# Patient Record
Sex: Female | Born: 1970
Health system: Southern US, Community
[De-identification: ages and names within clinical notes are randomized; demographics above are authoritative.]

## PROBLEM LIST (undated history)

## (undated) DIAGNOSIS — F419 Anxiety disorder, unspecified: Secondary | ICD-10-CM

## (undated) DIAGNOSIS — J45909 Unspecified asthma, uncomplicated: Secondary | ICD-10-CM

## (undated) DIAGNOSIS — K219 Gastro-esophageal reflux disease without esophagitis: Secondary | ICD-10-CM

## (undated) DIAGNOSIS — F32A Depression, unspecified: Secondary | ICD-10-CM

## (undated) DIAGNOSIS — F329 Major depressive disorder, single episode, unspecified: Secondary | ICD-10-CM

## (undated) DIAGNOSIS — N200 Calculus of kidney: Secondary | ICD-10-CM

## (undated) DIAGNOSIS — T7840XA Allergy, unspecified, initial encounter: Secondary | ICD-10-CM

## (undated) DIAGNOSIS — I10 Essential (primary) hypertension: Secondary | ICD-10-CM

## (undated) HISTORY — DX: Calculus of kidney: N20.0

## (undated) HISTORY — DX: Allergy, unspecified, initial encounter: T78.40XA

## (undated) HISTORY — DX: Gastro-esophageal reflux disease without esophagitis: K21.9

## (undated) HISTORY — PX: BUNIONECTOMY: SHX129

## (undated) HISTORY — PX: OVARIAN CYST REMOVAL: SHX89

---

## 1998-08-08 HISTORY — PX: ABDOMINAL HYSTERECTOMY: SHX81

## 1998-08-20 ENCOUNTER — Other Ambulatory Visit: Admission: RE | Admit: 1998-08-20 | Discharge: 1998-08-20 | Payer: Self-pay | Admitting: Obstetrics and Gynecology

## 1998-09-07 ENCOUNTER — Encounter: Payer: Self-pay | Admitting: Obstetrics and Gynecology

## 1998-09-07 ENCOUNTER — Ambulatory Visit (HOSPITAL_COMMUNITY): Admission: RE | Admit: 1998-09-07 | Discharge: 1998-09-07 | Payer: Self-pay | Admitting: Obstetrics and Gynecology

## 1998-10-12 ENCOUNTER — Inpatient Hospital Stay (HOSPITAL_COMMUNITY): Admission: AD | Admit: 1998-10-12 | Discharge: 1998-10-12 | Payer: Self-pay | Admitting: Obstetrics and Gynecology

## 1998-10-16 ENCOUNTER — Inpatient Hospital Stay (HOSPITAL_COMMUNITY): Admission: AD | Admit: 1998-10-16 | Discharge: 1998-10-16 | Payer: Self-pay | Admitting: Obstetrics and Gynecology

## 1998-12-27 ENCOUNTER — Inpatient Hospital Stay (HOSPITAL_COMMUNITY): Admission: AD | Admit: 1998-12-27 | Discharge: 1998-12-27 | Payer: Self-pay | Admitting: Obstetrics and Gynecology

## 1998-12-29 ENCOUNTER — Inpatient Hospital Stay (HOSPITAL_COMMUNITY): Admission: AD | Admit: 1998-12-29 | Discharge: 1998-12-29 | Payer: Self-pay | Admitting: Obstetrics and Gynecology

## 1998-12-31 ENCOUNTER — Encounter (HOSPITAL_COMMUNITY): Admission: RE | Admit: 1998-12-31 | Discharge: 1999-01-21 | Payer: Self-pay | Admitting: Obstetrics and Gynecology

## 1999-01-02 ENCOUNTER — Inpatient Hospital Stay (HOSPITAL_COMMUNITY): Admission: AD | Admit: 1999-01-02 | Discharge: 1999-01-02 | Payer: Self-pay | Admitting: Obstetrics and Gynecology

## 1999-01-11 ENCOUNTER — Inpatient Hospital Stay (HOSPITAL_COMMUNITY): Admission: AD | Admit: 1999-01-11 | Discharge: 1999-01-11 | Payer: Self-pay | Admitting: Obstetrics and Gynecology

## 1999-01-18 ENCOUNTER — Inpatient Hospital Stay (HOSPITAL_COMMUNITY): Admission: AD | Admit: 1999-01-18 | Discharge: 1999-01-18 | Payer: Self-pay | Admitting: Obstetrics and Gynecology

## 1999-01-20 ENCOUNTER — Inpatient Hospital Stay (HOSPITAL_COMMUNITY): Admission: AD | Admit: 1999-01-20 | Discharge: 1999-01-22 | Payer: Self-pay | Admitting: Obstetrics and Gynecology

## 1999-03-17 ENCOUNTER — Other Ambulatory Visit: Admission: RE | Admit: 1999-03-17 | Discharge: 1999-03-17 | Payer: Self-pay | Admitting: Obstetrics and Gynecology

## 1999-03-17 ENCOUNTER — Encounter (INDEPENDENT_AMBULATORY_CARE_PROVIDER_SITE_OTHER): Payer: Self-pay | Admitting: Specialist

## 1999-04-01 ENCOUNTER — Inpatient Hospital Stay (HOSPITAL_COMMUNITY): Admission: RE | Admit: 1999-04-01 | Discharge: 1999-04-04 | Payer: Self-pay | Admitting: Obstetrics and Gynecology

## 1999-04-01 ENCOUNTER — Encounter (INDEPENDENT_AMBULATORY_CARE_PROVIDER_SITE_OTHER): Payer: Self-pay | Admitting: Specialist

## 1999-11-30 ENCOUNTER — Encounter: Payer: Self-pay | Admitting: Obstetrics and Gynecology

## 1999-11-30 ENCOUNTER — Ambulatory Visit (HOSPITAL_COMMUNITY): Admission: RE | Admit: 1999-11-30 | Discharge: 1999-11-30 | Payer: Self-pay | Admitting: Obstetrics and Gynecology

## 2000-01-20 ENCOUNTER — Ambulatory Visit (HOSPITAL_COMMUNITY): Admission: RE | Admit: 2000-01-20 | Discharge: 2000-01-20 | Payer: Self-pay | Admitting: Obstetrics and Gynecology

## 2000-01-20 ENCOUNTER — Encounter: Payer: Self-pay | Admitting: Obstetrics and Gynecology

## 2000-05-19 ENCOUNTER — Encounter: Payer: Self-pay | Admitting: *Deleted

## 2000-05-19 ENCOUNTER — Encounter: Admission: RE | Admit: 2000-05-19 | Discharge: 2000-05-19 | Payer: Self-pay | Admitting: *Deleted

## 2000-07-13 ENCOUNTER — Encounter: Admission: RE | Admit: 2000-07-13 | Discharge: 2000-07-13 | Payer: Self-pay | Admitting: *Deleted

## 2000-07-13 ENCOUNTER — Encounter: Payer: Self-pay | Admitting: *Deleted

## 2000-07-20 ENCOUNTER — Ambulatory Visit (HOSPITAL_COMMUNITY): Admission: RE | Admit: 2000-07-20 | Discharge: 2000-07-20 | Payer: Self-pay | Admitting: Obstetrics and Gynecology

## 2000-10-06 ENCOUNTER — Other Ambulatory Visit: Admission: RE | Admit: 2000-10-06 | Discharge: 2000-10-06 | Payer: Self-pay | Admitting: Obstetrics and Gynecology

## 2000-12-21 ENCOUNTER — Ambulatory Visit (HOSPITAL_BASED_OUTPATIENT_CLINIC_OR_DEPARTMENT_OTHER): Admission: RE | Admit: 2000-12-21 | Discharge: 2000-12-21 | Payer: Self-pay | Admitting: Orthopedic Surgery

## 2002-01-11 ENCOUNTER — Emergency Department (HOSPITAL_COMMUNITY): Admission: EM | Admit: 2002-01-11 | Discharge: 2002-01-11 | Payer: Self-pay | Admitting: Emergency Medicine

## 2002-06-17 ENCOUNTER — Other Ambulatory Visit: Admission: RE | Admit: 2002-06-17 | Discharge: 2002-06-17 | Payer: Self-pay | Admitting: Obstetrics and Gynecology

## 2002-06-19 ENCOUNTER — Encounter: Payer: Self-pay | Admitting: Obstetrics and Gynecology

## 2002-06-19 ENCOUNTER — Ambulatory Visit (HOSPITAL_COMMUNITY): Admission: RE | Admit: 2002-06-19 | Discharge: 2002-06-19 | Payer: Self-pay | Admitting: Obstetrics and Gynecology

## 2002-07-02 ENCOUNTER — Encounter: Payer: Self-pay | Admitting: Internal Medicine

## 2002-07-12 ENCOUNTER — Encounter: Payer: Self-pay | Admitting: Internal Medicine

## 2002-07-15 ENCOUNTER — Encounter: Payer: Self-pay | Admitting: Internal Medicine

## 2003-02-24 ENCOUNTER — Emergency Department (HOSPITAL_COMMUNITY): Admission: EM | Admit: 2003-02-24 | Discharge: 2003-02-24 | Payer: Self-pay

## 2004-01-14 ENCOUNTER — Other Ambulatory Visit: Admission: RE | Admit: 2004-01-14 | Discharge: 2004-01-14 | Payer: Self-pay | Admitting: Obstetrics and Gynecology

## 2004-01-19 ENCOUNTER — Inpatient Hospital Stay (HOSPITAL_COMMUNITY): Admission: EM | Admit: 2004-01-19 | Discharge: 2004-01-21 | Payer: Self-pay | Admitting: Emergency Medicine

## 2004-01-20 ENCOUNTER — Encounter (INDEPENDENT_AMBULATORY_CARE_PROVIDER_SITE_OTHER): Payer: Self-pay | Admitting: Cardiology

## 2005-08-23 ENCOUNTER — Other Ambulatory Visit: Admission: RE | Admit: 2005-08-23 | Discharge: 2005-08-23 | Payer: Self-pay | Admitting: Obstetrics and Gynecology

## 2005-12-16 ENCOUNTER — Ambulatory Visit (HOSPITAL_COMMUNITY): Admission: RE | Admit: 2005-12-16 | Discharge: 2005-12-16 | Payer: Self-pay | Admitting: Obstetrics and Gynecology

## 2006-03-13 ENCOUNTER — Emergency Department (HOSPITAL_COMMUNITY): Admission: EM | Admit: 2006-03-13 | Discharge: 2006-03-13 | Payer: Self-pay | Admitting: Emergency Medicine

## 2007-06-11 ENCOUNTER — Emergency Department (HOSPITAL_COMMUNITY): Admission: EM | Admit: 2007-06-11 | Discharge: 2007-06-11 | Payer: Self-pay | Admitting: Emergency Medicine

## 2008-04-24 ENCOUNTER — Observation Stay (HOSPITAL_COMMUNITY): Admission: AD | Admit: 2008-04-24 | Discharge: 2008-04-25 | Payer: Self-pay | Admitting: Family Medicine

## 2008-04-24 ENCOUNTER — Ambulatory Visit: Payer: Self-pay | Admitting: Family Medicine

## 2008-05-28 ENCOUNTER — Ambulatory Visit: Payer: Self-pay | Admitting: Internal Medicine

## 2008-05-28 ENCOUNTER — Inpatient Hospital Stay (HOSPITAL_COMMUNITY): Admission: EM | Admit: 2008-05-28 | Discharge: 2008-06-02 | Payer: Self-pay | Admitting: Emergency Medicine

## 2008-05-28 ENCOUNTER — Ambulatory Visit: Payer: Self-pay | Admitting: Family Medicine

## 2008-06-20 ENCOUNTER — Encounter: Payer: Self-pay | Admitting: Family Medicine

## 2008-06-20 ENCOUNTER — Ambulatory Visit (HOSPITAL_COMMUNITY): Admission: RE | Admit: 2008-06-20 | Discharge: 2008-06-20 | Payer: Self-pay | Admitting: Family Medicine

## 2008-06-20 ENCOUNTER — Ambulatory Visit: Payer: Self-pay | Admitting: Vascular Surgery

## 2009-03-24 ENCOUNTER — Encounter: Admission: RE | Admit: 2009-03-24 | Discharge: 2009-03-24 | Payer: Self-pay | Admitting: Family Medicine

## 2009-03-25 ENCOUNTER — Emergency Department (HOSPITAL_COMMUNITY): Admission: EM | Admit: 2009-03-25 | Discharge: 2009-03-25 | Payer: Self-pay | Admitting: Emergency Medicine

## 2009-03-30 ENCOUNTER — Emergency Department (HOSPITAL_COMMUNITY): Admission: EM | Admit: 2009-03-30 | Discharge: 2009-03-30 | Payer: Self-pay | Admitting: Emergency Medicine

## 2009-06-28 ENCOUNTER — Inpatient Hospital Stay (HOSPITAL_COMMUNITY): Admission: EM | Admit: 2009-06-28 | Discharge: 2009-06-30 | Payer: Self-pay | Admitting: Emergency Medicine

## 2009-07-14 ENCOUNTER — Ambulatory Visit: Payer: Self-pay | Admitting: Internal Medicine

## 2009-07-15 ENCOUNTER — Telehealth (INDEPENDENT_AMBULATORY_CARE_PROVIDER_SITE_OTHER): Payer: Self-pay | Admitting: *Deleted

## 2009-08-11 ENCOUNTER — Ambulatory Visit: Payer: Self-pay | Admitting: Internal Medicine

## 2009-08-11 ENCOUNTER — Encounter: Admission: RE | Admit: 2009-08-11 | Discharge: 2009-08-11 | Payer: Self-pay | Admitting: Internal Medicine

## 2009-08-11 ENCOUNTER — Encounter (INDEPENDENT_AMBULATORY_CARE_PROVIDER_SITE_OTHER): Payer: Self-pay | Admitting: *Deleted

## 2009-09-09 ENCOUNTER — Ambulatory Visit: Payer: Self-pay | Admitting: Internal Medicine

## 2009-09-09 DIAGNOSIS — K589 Irritable bowel syndrome without diarrhea: Secondary | ICD-10-CM | POA: Insufficient documentation

## 2009-09-09 DIAGNOSIS — R1031 Right lower quadrant pain: Secondary | ICD-10-CM | POA: Insufficient documentation

## 2009-09-09 DIAGNOSIS — R10819 Abdominal tenderness, unspecified site: Secondary | ICD-10-CM

## 2009-09-09 LAB — CONVERTED CEMR LAB: Tissue Transglutaminase Ab, IgA: 0.5 units (ref <7)

## 2009-09-22 ENCOUNTER — Telehealth: Payer: Self-pay | Admitting: Internal Medicine

## 2009-10-29 IMAGING — CR DG CHEST 2V
2 series · 2 of 2 positions shown · non-contrast
Comparison: Chest 2 view, 01/19/04.

CLINICAL DATA: Cough X 1 month. 
 CHEST - 2 VIEW:

[view not recorded (1 of 2)]
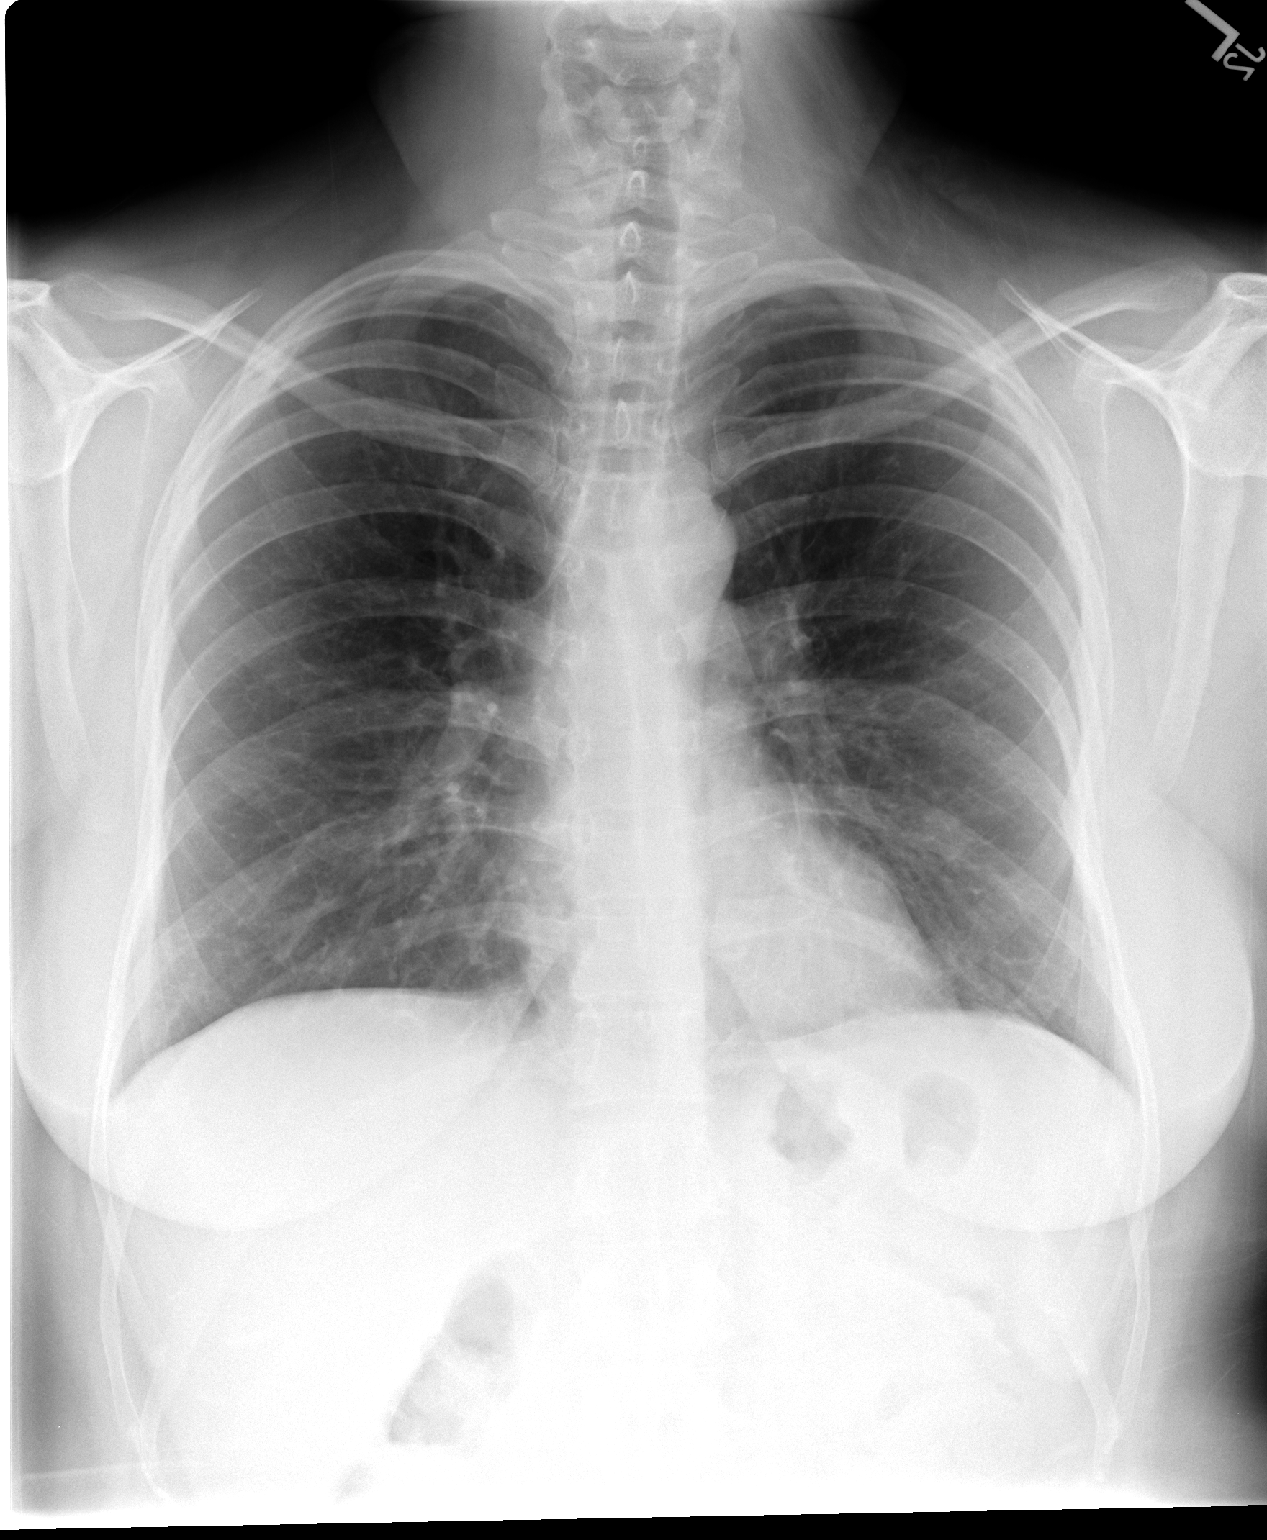

[view not recorded (2 of 2)]
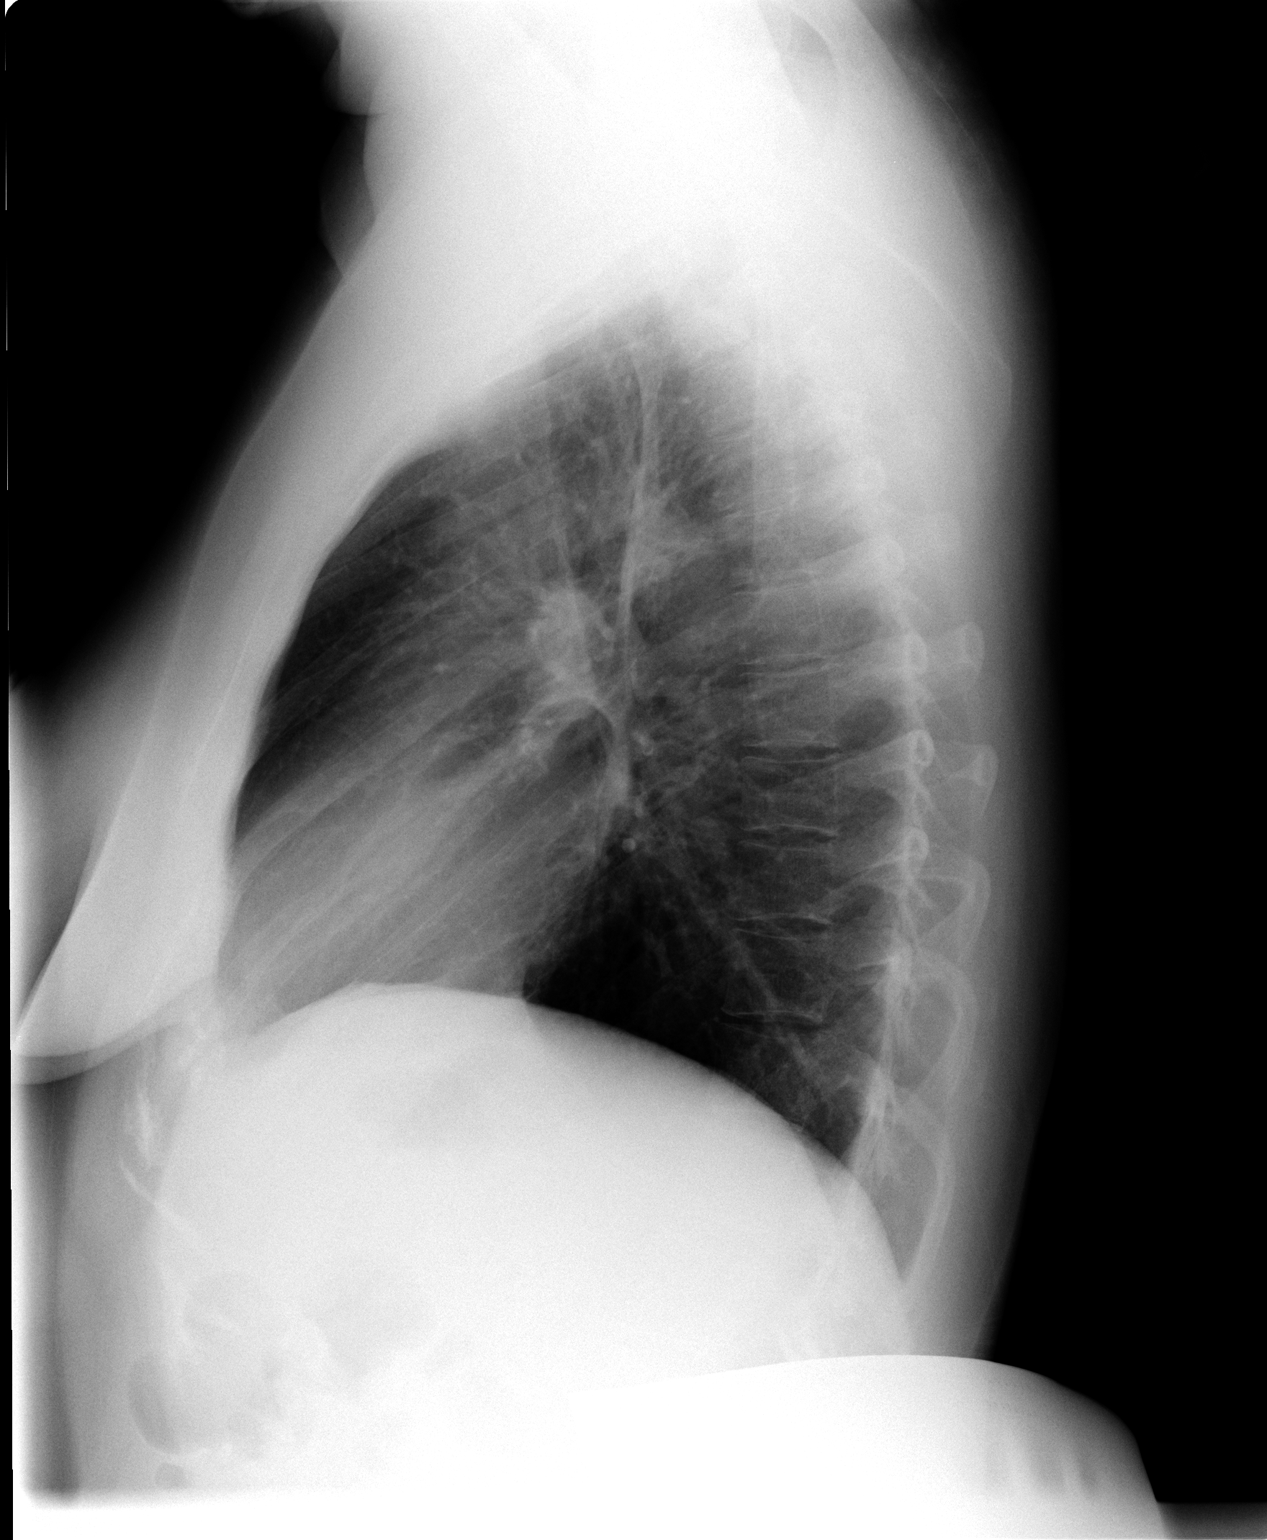

[2 of 2 positions shown; findings below may reference images not displayed]

FINDINGS: Normal heart.  Costophrenic angles are clear.  Normal pulmonary vasculature.  No evidence of effusion, infiltrate, or pneumothorax.
IMPRESSION: No acute cardiopulmonary process.

## 2010-09-07 NOTE — Progress Notes (Signed)
Summary: pain clinic eval  Phone Note Other Incoming   Summary of Call: Spoke to Dr. Jackelyn Knife re: her problems he does not think laparoscopy necessarily indicated at this time will pursue tertiary GI or pain clinic eval Initial call taken by: Iva Boop MD, Clementeen Graham,  September 22, 2009 10:38 PM  Follow-up for Phone Call        please call her and tell her I recommend a pain clinic eval and that I have discussed with dr. Jackelyn Knife. We do not thinkfurther GI or GYN w/u needed right now and that pain seems to be in abdominal wall. would refer to [facility] (# we got from [place]) let me know Follow-up by: Iva Boop MD, Clementeen Graham,  September 29, 2009 7:52 AM  Additional Follow-up for Phone Call Additional follow up Details #1::        I spoke with patient and she states pain has resolved and at this time she doesn't want to persue.  I have asked ehr to call back and we can make referral if the pain returns.  I will keep the referral packet and her records at my desk incase she calls back. Additional Follow-up by: Darcey Nora RN, CGRN,  September 30, 2009 10:52 AM    Additional Follow-up for Phone Call Additional follow up Details #2::    ok great.  Follow-up by: Iva Boop MD, Clementeen Graham,  September 30, 2009 3:03 PM

## 2010-09-07 NOTE — Procedures (Signed)
Summary: EGD: GERD, gastritis, duodenitis   EGD  Procedure date:  07/12/2002  Findings:      Location: [facility]   Patient Name: Laura Clay, Laura Clay. MRN:  Procedure Procedures: Panendoscopy (EGD) CPT: 43235.    with biopsy(s)/brushing(s). CPT: [id number].  Personnel: Endoscopist: Iva Boop, MD, [facility].  Exam Location: Exam performed in Outpatient Clinic. Outpatient  Patient Consent: Procedure, Alternatives, Risks and Benefits discussed, consent obtained, from patient. Consent was obtained by the RN.  Indications Symptoms: Chest Pain. Abdominal pain, Reflux symptoms  History  Pre-Exam Physical: Performed Jul 12, 2002  Cardio-pulmonary exam, HEENT exam, Mental status exam WNL.  Exam Exam Info: Maximum depth of insertion Duodenum, intended Duodenum. Patient position: on left side. Vocal cords not visualized. Gastric retroflexion performed. Images taken. ASA Classification: I. Tolerance: good.  Sedation Meds: Patient assessed and found to be appropriate for moderate (conscious) sedation. Fentanyl 100 mcg. given IV. Versed 5 mg. given IV. Cetacaine Spray 2 sprays given aerosolized.  Monitoring: BP and pulse monitoring done. Oximetry used. Supplemental O2 given  Findings ESOPHAGEAL INFLAMMATION: suspected as a result of reflux. Severity is moderate, erosions present.  Biopsy/Esoph Inflamtn taken. ICD9: Esophagitis, Reflux: 530.11. Comments: ? single erosion at GE junction. .  MUCOSAL ABNORMALITY: Duodenal Bulb. Erythematous mucosa. Red spots present. Edema present. Biopsy/Mucosal Abn taken. ICD9: Duodenitis without Hemorrhage: 535.60.  MUCOSAL ABNORMALITY: Antrum. Mottled mucosa. taken. ICD9: Gastritis, Unspecified: 535.50.  Normal: Duodenal Apex.    Comments: All areas not described above are normal. Assessment Abnormal examination, see findings above.  Diagnoses: 530.11: Esophagitis, Reflux.  535.50: Gastritis, Unspecified.  535.60: Duodenitis without  Hemorrhage.   Events  Unplanned Intervention: No unplanned interventions were required.  Plans Medication(s): Await pathology. PPI: Aciphex 20 mg QD, starting Jul 12, 2002   Disposition: After procedure patient sent to recovery. After recovery patient sent home.  Comments: Will set up follow-up after colonoscopy.   CC:   Lavina Hamman, MD  This report was created from the original endoscopy report, which was reviewed and signed by the above listed endoscopist.

## 2010-09-07 NOTE — Procedures (Signed)
Summary: Colonoscopy+ terminal ileoscopy: Hemorrhoids, neg random bxs   Colonoscopy  Procedure date:  07/15/2002  Findings:      Results: Hemorrhoids.     Location:  [facility].   Patient Name: Laura Clay, Laura Clay. MRN:  Procedure Procedures: Colonoscopy CPT: [phone].    with biopsy. CPT: Q5068410.  Personnel: Endoscopist: Iva Boop, MD, [facility].  Exam Location: Exam performed in Outpatient Clinic. Outpatient  Patient Consent: Procedure, Alternatives, Risks and Benefits discussed, consent obtained, from patient. Consent was obtained by the RN.  Indications Symptoms: Diarrhea Hematochezia.  History  Pre-Exam Physical: Performed Jul 12, 2002. Cardio-pulmonary exam, Rectal exam, HEENT exam , Mental status exam WNL.  Exam Exam: Extent of exam reached: Terminal Ileum, extent intended: Terminal Ileum.  The cecum was identified by appendiceal orifice and IC valve. Patient position: on left side. Colon retroflexion performed. Images taken. ASA Classification: I. Tolerance: good.  Monitoring: Pulse and BP monitoring, Oximetry used. Supplemental O2 given.  Colon Prep Used Golytely for colon prep. Prep results: excellent.  Sedation Meds: Patient assessed and found to be appropriate for moderate (conscious) sedation. Fentanyl 100 mcg. given IV. Versed 10 mg. given IV.  Findings - NORMAL EXAM: Ileum to Sigmoid Colon. Biopsy/Normal Exam taken. Comments: Random biopsies to exclude microscopic colitis.  HEMORRHOIDS: Internal and External. Size: Grade II. Not bleeding. Not thrombosed. ICD9: Hemorrhoids, Internal and  External: 455.6.   Assessment Abnormal examination, see findings above.  Diagnoses: 455.6: Hemorrhoids, Internal and  External.   Events  Unplanned Interventions: No intervention was required.  Plans Medication Plan: Await pathology.  Patient Education: Patient given standard instructions for: Hemorrhoids.  Disposition: After procedure patient sent  to recovery. After recovery patient sent home.  Scheduling/Referral: Clinic Visit, to Iva Boop, MD, [facility], 2-3 weeks,   CC:   Lavina Hamman, MD  This report was created from the original endoscopy report, which was reviewed and signed by the above listed endoscopist.

## 2010-09-07 NOTE — Assessment & Plan Note (Signed)
Summary: rlq abd pain, rule out ibs...em   History of Present Illness Visit Type: consult  Primary GI MD: Stan Head MD [facility] Primary Provider: Marlan Palau, MD Requesting Provider: Marlan Palau, MD Chief Complaint: right side pain, diarrhea, and constipation  History of Present Illness:   40 yo white woman with  a several month history of abdominal pain that began and is mainly located in the RLQ. She actually has had simiilar problems before and was evaluated by me with EGD and colonoscopy in 2003. She did not follow-up. The problems resolved but returned in 2010. She has had severe RLQ pain in 03/2009, fairly sudden onset, crampy. CT then at ED evaluation was negative. More ED visit(s) with another CT and tne a third negative abd/pelvic CT recently negative. Has seen Dr. Jackelyn Knife and has had pelvic exam. She says he told her if it isn't a GI problem then a laparoscopy is in order. She had a laparoscopy and LOA (minimal adhesiolysis)in 2007 (reviewed)  Her pain is now mainly a dull sensation but she has an exquisite sensitivity to abdominal wall palpation which produces a gag, nausea and sometinmes vomiting. She cannot even hug her husband without inducing a gag reflex. There is some bacjkground nausea helped by promethazine. Hyoscyamine has not provided relief. She has has temps 99-100.  Bowels aklternate between hard an loose. Some heartburn. No trigers noted except pressure on abdominal wall as described above. Did not inquire about effects of intercourse on sxs.     GI Review of Systems    Reports abdominal pain, acid reflux, heartburn, nausea, and  vomiting.     Location of  Abdominal pain: right side.    Denies belching, bloating, chest pain, dysphagia with liquids, dysphagia with solids, loss of appetite, vomiting blood, weight loss, and  weight gain.      Reports constipation, diarrhea, and  hemorrhoids.     Denies anal fissure, black tarry stools, change in bowel habit,  diverticulosis, fecal incontinence, heme positive stool, irritable bowel syndrome, jaundice, light color stool, liver problems, rectal bleeding, and  rectal pain.    Current Medications (verified): 1)  Promethazine Hcl 25 Mg Tabs (Promethazine Hcl) .... One Tablet By Mouth Every Four To Six Hours As Needed 2)  Hyoscyamine Sulfate 0.125 Mg Tabs (Hyoscyamine Sulfate) .... One Tablet By Mouth Every Six Hours As Needed For Abdominal Pain 3)  Bisoprolol-Hydrochlorothiazide 2.5-6.25 Mg Tabs (Bisoprolol-Hydrochlorothiazide) .... One Tablet By Mouth Once Daily 4)  Prilosec Otc 20 Mg Tbec (Omeprazole Magnesium) .... One Tablet By Mouth Two Times A Day 5)  Symbicort 80-4.5 Mcg/act Aero (Budesonide-Formoterol Fumarate) .... 2 Puffs Two Times A Day 6)  Ibuprofen 200 Mg Tabs (Ibuprofen) .... As Needed 7)  Xolair 150 Mg Solr (Omalizumab) .... Every Two Weeks 8)  Nasonex 50 Mcg/act Susp (Mometasone Furoate) .... Once Daily 9)  Singulair 10 Mg Tabs (Montelukast Sodium) .... One Tablet By Mouth Once Daily 10)  Albuterol Sulfate (5 Mg/ml) 0.5% Nebu (Albuterol Sulfate) .... As Needed  Allergies (verified): 1)  ! 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  Alcohol Use - yes: 2 daily  Daily Caffeine Use: 1 occ  Illicit Drug Use - no Smoking Status:  current Drug Use:  no  Review of Systems       The patient complains of  allergy/sinus, cough, and shortness of breath.         All other ROS negative except as per HPI.   Vital Signs:  Patient profile:   40 year old female Height:      65 inches Weight:      163 pounds BMI:     27.22 BSA:     1.82 Pulse rate:   80 / minute Pulse rhythm:   regular BP sitting:   128 / 76  (left arm) Cuff size:   regular  Vitals Entered By: Ok Anis CMA (September 09, 2009 2:06 PM)  Physical Exam  General:  Well developed, well nourished, no acute distress overweight. Eyes:  PERRLA, no icterus. Mouth:  No deformity or lesions, dentition normal. Neck:  Supple; no masses or thyromegaly. Lungs:  Clear throughout to auscultation. Heart:  Regular rate and rhythm; no murmurs, rubs,  or bruits. Abdomen:  obese, soft, BS+ light touch, any palpation, even stehescope will trigger nausea/gag no masses, overall seems benign no masses. exam limited by inability to palpate deeply probably less so with muscle tension but not resolved Extremities:  No clubbing, cyanosis, edema or deformities noted. Neurologic:  Alert and  oriented x4;   Cervical Nodes:  No significant cervical or supraclavicular adenopathy.  Psych:  Alert and cooperative. Normal mood and affect.   Impression & Recommendations:  Problem # 1:  ABDOMINAL PAIN RIGHT LOWER QUADRANT (ICD-789.03) Assessment New CT scans x 3 negative. Has had EGD and colonoscopy 2003 without findings to explain this. IBS seems possible. She has had recurrent pain problems and 2 negative laparoscopies since her hysterectomy. Likely a chronic functional or neuropathic pain. I doubt furher endoscopic evaluation would she much light but could potentially be helpful. Will await labs and discuss with Dr. Jackelyn Knife. A tertiary GI or pain clinic  evaluation may be useful (see #2). Orders: T-Sprue Panel (Celiac Disease Aby Eval) (83516x3/[address]) TLB-CRP-High Sensitivity (C-Reactive Protein) (86140-FCRP) TLB-Sedimentation Rate (ESR)  (85652-ESR)  Problem # 2:  ABDOMINAL TENDERNESS (ICD-789.60) Assessment: Comment Only Described in 2003 and has recurred, seems worse Very unusual hyperesthesia-like problem with associated gagging and retching with nausea and some vomiting. Visceral hypersensitivity to pain seems to be the problem.  Similar problems as in 2003 and EGD/colonoscopy did not reveal a cause. Though I think she has IBS, I do not think this is a "gut" problem. seems to be in the abdominal wall. 3 CT abd/pelvis since Aug are negative except ovarian pathology of ? relation. I suspect laparaoscopy will be next step. Do not think endoscopic evaluation would be necessary again. Await labs and will discuss with Dr. Jackelyn Knife. May need pain specialist with attention to abdominal wall. Orders: T-Sprue Panel (Celiac Disease Aby Eval) (83516x3/[address]) TLB-CRP-High Sensitivity (C-Reactive Protein) (86140-FCRP) TLB-Sedimentation Rate (ESR) (85652-ESR)  Problem # 3:  IRRITABLE BOWEL SYNDROME (ICD-564.1) Her chronic recurrent alternating bowel habits and negative colonoscopy with random colon biopsies in 2003 indicate this. I doubt there is other "GI pathology" based upon overall picture. Orders: T-Sprue Panel (Celiac Disease Aby Eval) (83516x3/[address]) TLB-CRP-High Sensitivity (C-Reactive Protein) (86140-FCRP) TLB-Sedimentation Rate (ESR) (85652-ESR)  Patient Instructions: 1)  Please go to the basement to have your  lab tests drawn today.  2)  We will call you with results and follow up. 3)  Copy sent to : Marlan Palau, MD, Lavina Hamman, MD 4)  The medication list was reviewed and reconciled.  All changed / newly prescribed medications were explained.  A complete medication list was provided to the patient / caregiver.

## 2010-09-07 NOTE — Consult Note (Signed)
Summary: [facility], [facility]   Imported By: Sherian Rein 10/06/2009 11:43:58  _____________________________________________________________________  External Attachment:    Type:   Image     Comment:   External Document

## 2010-11-10 LAB — CBC
HCT: 38.1 % (ref 36.0–46.0)
HCT: 43.7 % (ref 36.0–46.0)
Hemoglobin: 13.1 g/dL (ref 12.0–15.0)
MCHC: 34.5 g/dL (ref 30.0–36.0)
MCV: 93.9 fL (ref 78.0–100.0)
MCV: 94.6 fL (ref 78.0–100.0)
MCV: 94.6 fL (ref 78.0–100.0)
Platelets: 224 10*3/uL (ref 150–400)
Platelets: 234 10*3/uL (ref 150–400)
Platelets: 250 10*3/uL (ref 150–400)
RDW: 11.8 % (ref 11.5–15.5)
RDW: 12.7 % (ref 11.5–15.5)
WBC: 14.2 10*3/uL — ABNORMAL HIGH (ref 4.0–10.5)

## 2010-11-10 LAB — DIFFERENTIAL
Basophils Absolute: 0 10*3/uL (ref 0.0–0.1)
Basophils Relative: 0 % (ref 0–1)
Basophils Relative: 0 % (ref 0–1)
Eosinophils Absolute: 0 10*3/uL (ref 0.0–0.7)
Eosinophils Absolute: 0 10*3/uL (ref 0.0–0.7)
Eosinophils Absolute: 0 10*3/uL (ref 0.0–0.7)
Eosinophils Relative: 0 % (ref 0–5)
Eosinophils Relative: 0 % (ref 0–5)
Lymphocytes Relative: 11 % — ABNORMAL LOW (ref 12–46)
Lymphocytes Relative: 16 % (ref 12–46)
Lymphs Abs: 1.1 10*3/uL (ref 0.7–4.0)
Lymphs Abs: 1.2 10*3/uL (ref 0.7–4.0)
Monocytes Absolute: 0.3 10*3/uL (ref 0.1–1.0)
Monocytes Relative: 1 % — ABNORMAL LOW (ref 3–12)
Neutro Abs: 13 10*3/uL — ABNORMAL HIGH (ref 1.7–7.7)
Neutrophils Relative %: 81 % — ABNORMAL HIGH (ref 43–77)
Neutrophils Relative %: 91 % — ABNORMAL HIGH (ref 43–77)

## 2010-11-10 LAB — POCT I-STAT, CHEM 8
BUN: 6 mg/dL (ref 6–23)
Creatinine, Ser: 0.8 mg/dL (ref 0.4–1.2)
Glucose, Bld: 157 mg/dL — ABNORMAL HIGH (ref 70–99)
Hemoglobin: 15.3 g/dL — ABNORMAL HIGH (ref 12.0–15.0)
Potassium: 3.7 mEq/L (ref 3.5–5.1)
Sodium: 138 mEq/L (ref 135–145)

## 2010-11-10 LAB — BASIC METABOLIC PANEL
BUN: 7 mg/dL (ref 6–23)
BUN: 8 mg/dL (ref 6–23)
CO2: 22 mEq/L (ref 19–32)
Calcium: 8.9 mg/dL (ref 8.4–10.5)
Chloride: 106 mEq/L (ref 96–112)
Chloride: 106 mEq/L (ref 96–112)
Creatinine, Ser: 0.74 mg/dL (ref 0.4–1.2)
GFR calc Af Amer: >60 mL/min (ref >60)
Glucose, Bld: 129 mg/dL — ABNORMAL HIGH (ref 70–99)
Potassium: 3.9 mEq/L (ref 3.5–5.1)
Sodium: 135 mEq/L (ref 135–145)

## 2010-11-10 LAB — BLOOD GAS, ARTERIAL
Acid-base deficit: 1 mmol/L (ref 0.0–2.0)
FIO2: 0.21 %
O2 Saturation: 97 %
pCO2 arterial: 30.7 mmHg — ABNORMAL LOW (ref 35.0–45.0)
pH, Arterial: 7.458 — ABNORMAL HIGH (ref 7.350–7.400)
pO2, Arterial: 161 mmHg — ABNORMAL HIGH (ref 80.0–100.0)

## 2010-11-13 LAB — COMPREHENSIVE METABOLIC PANEL
ALT: 44 U/L — ABNORMAL HIGH (ref 0–35)
AST: 36 U/L (ref 0–37)
AST: 32 U/L (ref 0–37)
Albumin: 3.9 g/dL (ref 3.5–5.2)
Alkaline Phosphatase: 66 U/L (ref 39–117)
CO2: 24 mEq/L (ref 19–32)
Calcium: 9.4 mg/dL (ref 8.4–10.5)
Creatinine, Ser: 0.87 mg/dL (ref 0.4–1.2)
GFR calc Af Amer: >60 mL/min (ref >60)
GFR calc non Af Amer: >60 mL/min (ref >60)
Potassium: 4.6 mEq/L (ref 3.5–5.1)
Sodium: 135 mEq/L (ref 135–145)
Total Protein: 7.3 g/dL (ref 6.0–8.3)

## 2010-11-13 LAB — CBC
MCHC: 35 g/dL (ref 30.0–36.0)
MCV: 94.9 fL (ref 78.0–100.0)
Platelets: 229 10*3/uL (ref 150–400)
RBC: 4.64 MIL/uL (ref 3.87–5.11)
RDW: 12.7 % (ref 11.5–15.5)

## 2010-11-13 LAB — URINALYSIS, ROUTINE W REFLEX MICROSCOPIC
Bilirubin Urine: NEGATIVE
Glucose, UA: NEGATIVE mg/dL
Hgb urine dipstick: NEGATIVE
Ketones, ur: NEGATIVE mg/dL
Ketones, ur: NEGATIVE mg/dL
Nitrite: NEGATIVE
Protein, ur: NEGATIVE mg/dL
Protein, ur: NEGATIVE mg/dL
pH: 7 (ref 5.0–8.0)

## 2010-11-13 LAB — PREGNANCY, URINE: Preg Test, Ur: NEGATIVE

## 2010-11-13 LAB — DIFFERENTIAL
Basophils Relative: 1 % (ref 0–1)
Eosinophils Absolute: 0.5 10*3/uL (ref 0.0–0.7)
Eosinophils Relative: 5 % (ref 0–5)
Lymphocytes Relative: 34 % (ref 12–46)
Lymphs Abs: 2.2 10*3/uL (ref 0.7–4.0)
Monocytes Absolute: 0.5 10*3/uL (ref 0.1–1.0)
Monocytes Relative: 5 % (ref 3–12)
Neutro Abs: 4.5 10*3/uL (ref 1.7–7.7)
Neutro Abs: 3.5 10*3/uL (ref 1.7–7.7)
Neutrophils Relative %: 54 % (ref 43–77)

## 2010-11-13 LAB — WET PREP, GENITAL
Clue Cells Wet Prep HPF POC: NONE SEEN
Trich, Wet Prep: NONE SEEN
WBC, Wet Prep HPF POC: NONE SEEN
Yeast Wet Prep HPF POC: NONE SEEN

## 2010-11-13 LAB — LIPASE, BLOOD: Lipase: 24 U/L (ref 11–59)

## 2010-12-21 NOTE — Discharge Summary (Signed)
Laura Clay, Laura Clay                  ACCOUNT NO.:  [id number]   MEDICAL RECORD NO.:  [id number]          PATIENT TYPE:  INP   LOCATION:  5525                         FACILITY:  [facility]   PHYSICIAN:  Laura Bumpers. Hensel, Laura ClayDATE OF BIRTH:  [birth year]   DATE OF ADMISSION:  05/28/2008  DATE OF DISCHARGE:  06/02/2008                               DISCHARGE SUMMARY   PRIMARY CARE Hanna Aultman:  Elvina Sidle, Laura Clay.  He is a doctor at [facility].  The number is [phone].   DISCHARGE DIAGNOSES:  1. Chronic paroxysmal cough thought to be due to recent H1N1      combination with reflux and pneumonia.  It is improving on the day      of discharge.  2. Gastroesophageal reflux disease.  3. Hypertension.   DISCHARGE MEDICATIONS:  1. Symbicort 80/4.5 mcg inhaler 2 puffs twice daily.  2. Mucinex DM 2 tablets by mouth twice daily.  3. Bisoprolol 5 mg by mouth twice daily.  4. Albuterol HFA 90 mcg inhaler 1 puff every 2-4 hours as needed.  5. Pepcid 40 mg by mouth at night before bedtime.  6. Triamterene/HCTZ 37.5/25 one tablet by mouth daily.  7. Omeprazole 20 mg by mouth daily.   CONSULTS:  Included a Pulmonology consult by Dr. Sherene Sires on May 30, 2008.  Pulmonology consult by Dr. Tyson Alias on May 29, 2008 and an  ENT Otolaryngology consult on May 30, 2008, by Dr. Lucky Cowboy.   PROCEDURES DURING THIS HOSPITALIZATION:  There was a chest x-ray done on  May 28, 2008, that showed mild bronchitic changes.  On May 29, 2008, the patient had a chest CT without contrast showing no focal  lesion or diffuse etiology to explain the patient's symptoms.  On  May 29, 2008, the patient also had a maxillofacial CT without  contrast showing;  1. Minimal mucosal thickening posterior left maxillary sinus.  2. No other significant sinus disease.  On May 31, 2008, the patient had a another chest x-ray which showed  mild peribronchial thickening correlated clinically for acute  versus  chronic respiratory illness.  On May 31, 2008, the patient also had  a transnasal fiberoptic laryngoscopy done which showed;  1. A right nasal cavity that water boggy edema, right greater than      left lingular tonsil hypertrophy.  2. Very mild right vocal cord paresis, mild bilateral vocal cord      edema, moderate interarytenoid and postcricoid edema and erythema      and no exudate, no ulcerations, no leukoplakia, and no masses.   LABORATORY DATA:  On the day of admission, on May 28, 2008, a CBC  with a white blood cell count of 6, hemoglobin 13.7, and platelets at  216.  A CMP that showed a sodium of 135, potassium of 2.8, chloride 102,  bicarb 21, BUN 9, creatinine 1.12 with a glucose of 169.  She had a  total bili of 0.7, alkaline phosphatase 63, AST 28, ALT 30, total  protein 6.9, albumin 3.5, and calcium 8.6.  Her urinalysis was  essentially  negative except for she had 15 ketones and a few bacteria.  Her C-reactive protein was little elevated at 5.7.  Her TSH was a little  bit low at 0.268.  This is something that will need to be followed up  clinically with her PCP.  She did have a D-dimer that was normal.  She  had 2 preliminary blood cultures that had no growth and on the final CBC  and BMET, CBC showed white blood cell count of 16, however, this is  elevated likely due to steroids as well as a hemoglobin of 14.2,  hematocrit 40.8, and platelets of 278.  She had a BMET 133 sodium, 4.4  potassium, and otherwise normal.   BRIEF HOSPITAL COURSE:  This is a 40 year old female that came into the  hospital with greater than 8 weeks of multiple respiratory infections.  1. She came in with shortness of breath and hypoxemia.  She had been      treated for flu symptoms with Tamiflu, for pneumonia with Levaquin,      and had also been treated with Avelox.  She continued to have a      cough, shortness of breath, and hypoxia.  She finished her course      of Avelox  while here in the hospital.  The patient originally      worsened upon admission and requiring up to 3.5 liters nasal      cannula and saturating about 90-94%.  Pulmonology was consulted.      They felt like it was likely due to sphincter dysfunction or      tracheobronchitis and some of her medications were changed.  She      was taken off Macrobid.  It was advised to not start her on any      calcium channel blockers as they may interfere with the sphincter.      She was felt to possibly have vocal cord dysfunction as well.      Subsequently, ENT was consulted and they felt like she may be      having underlying reflux and possible lower airway reactivity due      to micro aspiration along with H1N1 post chronic cough.  She may      have experienced a pneumonia also which was resolving slowly.  It      was strongly advised that the patient stop smoking and stop using      trigger foods that may cause reflux like [employer] products and      other foods that she was given a list of.  She also was felt to      have a lingual tonsillitis that was most likely residual sequelae      from H1N1 as far as treatment it was not needed at this point.  The      patient improved the last couple of days of her hospital admission.      She was able to maintain her saturations at 99-100% on room air      while lying down.  She was walked down the hallway with her mask on      prior to discharge and she was able to maintain her saturations 95%      and above.  It was felt that the patient would do beautifully going      home.  She did not require home O2 and was discharged in stable      condition.  2. Hypertension.  The patient was changed from metoprolol to      bisoprolol as Pulmonology felt that this would be a better      medication for the patient.  Also the patient had Pepcid added to      her regimen for GERD, for reflux that may be contributing to her      hypoxia.  The patient had special  instructions from ENT as to her      diet.  She has a follow up appointment with Dr. Milus Glazier on      June 05, 2008, at 10:30 a.m.  The phone number is [phone].  He      is a Field seismologist.  The patient was discharged home in stable      medical condition.      Laura Brookes, Laura Clay  Electronically Signed      Laura Clay, M.D.  Electronically Signed    AS/MEDQ  D:  06/02/2008  T:  06/03/2008  Job:  [id number]   cc:   Elvina Sidle, M.D.

## 2010-12-21 NOTE — H&P (Signed)
Laura Clay, Laura Clay                  ACCOUNT NO.:  [id number]   MEDICAL RECORD NO.:  [id number]          PATIENT TYPE:  INP   LOCATION:  3701                         FACILITY:  [facility]   PHYSICIAN:  Ardeen Garland, MD       DATE OF BIRTH:  05/25/1971   DATE OF ADMISSION:  04/24/2008  DATE OF DISCHARGE:                              HISTORY & PHYSICAL   PRIMARY CARE Tanny Harnack:  Dr. Faustino Congress at [facility].   CHIEF COMPLAINT:  Chest pain.   HISTORY OF PRESENT ILLNESS:  Starting 3 weeks ago, Ms. Debroux developed  what seems to be a viral upper respiratory infection with symptoms of  cough, sneeze, runny nose, fever, chills, nausea ,vomiting, and general  malaise.  At this time, she presented to her primary care Niraj Kudrna and  noted to have uncontrolled hypertension.  Systolic blood pressure is  above 200 and diastolic blood pressure is above 100.  She had run out of  her blood pressure medications of metoprolol, amlodipine, and  hydrochlorothiazide, these prescriptions were refilled and restarted.  She was seen back in clinic and noted still to be hypertensive.  She was  still complaining of her general unwellness at this time.  Dr.  Faustino Congress at this time presumed that she had developed a bacterial  pneumonia following her viral upper respiratory tract infection and gave  her Levaquin and prednisone.  Starting on Tuesday, April 22, 2008,  she developed chest pain that she described as bear hug in her right  upper chest and right sternal border.  Her pain is worse with movement  especially involving her right shoulder and deep breathing and better  with rest.  She also complains upon admission of abdominal pain mostly  in her right lower quadrant.  Significant historical information  includes lots of driving her car, trips greater that 2 hours, and she  drove to [place] and back in July, and she denies any leg pain or  unilateral swelling.  Dr. Faustino Congress  suspected that her hypertension was  a secondary cause and ordered an outpatient ultrasound of her kidneys.   PAST MEDICAL HISTORY:  1. Hypertension, diagnosed 4 years ago.  2. GERD.  3. Kidney stones.  4. Intermittent bronchitis with reactive airway disease.   MEDICATIONS:  1. Metoprolol XL 100 mg.  2. Hydrochlorothiazide 25.  3. Amlodipine 5.  4. Omeprazole.   RECENT MEDICATIONS:  1. Tessalon Perles 100 mg.  2. Levaquin 500 mg.  3. Prednisone 20 mg.  4. Ibuprofen 600 mg 2 times a day p.r.n.  5. DayQuil and NyQuil 2 doses 3 weeks ago, was instructed to stop      taking.   ALLERGIES:  MORPHINE, which she developed hives.   PAST SURGICAL HISTORY:  1. Hysterectomy, 2002 secondary to high-grade cervical dysplasia.  2. Lysis of adhesions, 2001 with ovarian cyst.   SOCIAL HISTORY:  Lives with husband and 2 kids, works as a Airline pilot person,  is normally a 4 cigarettes per day smoker, however, stopped in last 3  weeks with her recent illness.  She drinks about 3-4 drinks per week and  does not use any of the drugs or prescription pills that are her own.   FAMILY HISTORY:  Mother has hypertension.  Father died at 73 secondary  to CHF and CAD.  She has a child with asthma.   REVIEW OF SYSTEMS:  Please see history of present illness, however, also  positive for headaches, rhinorrhea, otherwise, remainder of review of  systems is negative.   PHYSICAL EXAMINATION:  VITAL SIGNS:  Temperature is 98.6, pulse 94,  respiratory rate 18, blood pressure 144/108, saturating 96% on room air,  and weight 169 pounds.  GENERAL:  Well-nourished appearing woman, coughing in no acute distress.  HEENT:  Moist mucous membranes without erythema or exudate.  NECK:  Supple without lymphadenopathy.  CV:  Tachy rhythm but regular and no murmurs, rubs, or gallops.  She has  point tenderness over her right sternal border.  LUNGS:  Coarse breath sounds throughout with expiratory wheeze in  bibasilar  regions.  ABDOMEN:  Nondistended.  Normoactive bowel sounds noted, tender to  palpation in the right lower quadrant and to a lesser degree in the left  lower quadrant.  However, she does not exhibit guarding.  There are no  masses that were palpated.  No hernias were noted in the femoral region  also.  EXTREMITIES:  Nonedematous with 2+ pulses palpable in bilateral DPs,  calves are not enlarged, and they are symmetrical in size, nontender,  and no tenderness with ankle dorsiflexion.  NEURO:  No gross abnormalities noted on exam.  MSK:  Pain on range of motion of right shoulder area.  Pain noted to be  around the sternoclavicular joint.   LABORATORY DATA:  All labs are pending.  EKG showed no acute pathology.  Outpatient ultrasound of abdomen, which showed no hydronephrosis  bilaterally with renal calculi and a liver with heterogeneous echo  texture concerning for fatty infiltrate.   ASSESSMENT AND PLAN:  A 40 year old female with a recent history  concerning for viral URI with post-viral pneumonia, now with  hypertension and chest pain.  The differential includes,  1. Musculoskeletal chest pain.  2. Pleuritic chest pain.  3. Pneumonia.  4. Pulmonary embolism.  5. Myocardial infarction.   1. Chest pain.  Likely costochondritis/MSK.  However, we will evaluate      for dangerous pathology.  Current EKG is normal.  We will reobtain      EKG in morning.  We will obtain D-dimer now and blood culture now.      We will also cycle enzymes and observe at telemetry overnight.  We      will follow positive D-dimer with a CTA to evacuate for pulmonary      embolus.  Of note, PE is unlikely; however, she does have elevated      pulse rate which could indicate PE.  2. Hypertension.  We will continue current home meds.  Ultrasound      obtained from [facility]  indicates no hydronephrosis, however it was      not a Doppler study of the renal arteries.  If BP does not respond      to home meds we  will continue to evaluate for the possibility of      renal artery stenosis.  3. Cough.  We will give Tessalon Perles and Tussionex for cough.  We      will also provide albuterol metered dose inhaler for shortness of  breath and wheeze.  We will obtain blood cultures for possibility      of bacteremia.  4. Abdominal pain.  She has a history of multiple abdominal surgeries.      However, her current exam is not concerning for acute      abdomen/surgical abdomen.  We will obtain urinalysis and continue      to observe.  5. Prophylaxis.  We asked Ms. Cataldo to ambulate for DVT prophylaxis.      She does ambulate well.  We will provide Protonix for ulcer      prophylaxis even as she normally does take her proton pump      inhibitor, and she is on steroids or has been on steroids.  6. Fluids, electrolytes, and nutrition.  We will provide IV fluids and      normal saline at 100 mL/hour.  We will provide a heart healthy      diet.  She does have hypertension, and we will follow up her      metabolic panel especially concerning for elevations in creatinine      if this is present.      Ardeen Garland, MD  Electronically Signed     LM/MEDQ  D:  04/24/2008  T:  04/25/2008  Job:  [id number]

## 2010-12-21 NOTE — H&P (Signed)
NAMEANALYAH, MCCONNON                  ACCOUNT NO.:  [id number]   MEDICAL RECORD NO.:  [id number]          PATIENT TYPE:  INP   LOCATION:  5525                         FACILITY:  [facility]   PHYSICIAN:  Nestor Ramp, MD        DATE OF BIRTH:  [birth year]   DATE OF ADMISSION:  05/28/2008  DATE OF DISCHARGE:                              HISTORY & PHYSICAL   CHIEF COMPLAINT:  Hypoxia admits from [facility].   PRIMARY CARE Zoila Ditullio:  [facility].   HISTORY OF PRESENT ILLNESS:  The patient is a 40 year old female with  history of chronic hypertension that had been somewhat difficult to  treat who presents from [facility] with shortness of breath,  cough, and hypoxia that worsened last night.  The patient reports that  she had increased shortness of breath yesterday with progression  overnight, and she had difficulty sleeping, increased wheeze, and cough.  The patient also reports fever to 102 yesterday.  She does endorse  chills, also a backache and neck pain as well.  Of note, the patient  states that she has been having respiratory symptoms for about 6-8 weeks  with the time course as follows:  1. Diagnosed with presumptive H1N1 6-8 weeks ago, treated with the      course of Tamiflu.  2. One to two weeks later was diagnosed with pneumonia on a chest x-      ray and treated with 7 days of Levaquin as an outpatient.  3. Was admitted to the [facility] on April 24, 2008, for chest pain and ruled out for myocardial infarction      and discharged a day later.  4. Presented to [facility] last week with increased cough,      treating with course of Avelox for 7 days, which she is still      taking, has 2 pills left.  5. Presentation today __________.  The patient denies any runny nose.      She has some nausea.  No vomiting.  She states that she has been      tolerating oral intake, but has decreased appetite.  The patient      does  report that family members have also had upper respiratory      tract symptoms.  The patient states that she has never been      diagnosed with asthma; however, she has been using albuterol to      treat these symptoms and has had some relief with albuterol.  The      patient is a smoker since the age of 62, smoking approximately 4      cigarettes per day.   ALLERGIES:  MORPHINE causes hives.   MEDICATIONS:  1. Avelox started on May 22, 2008.  The patient has 2 pills left,      has not taken the medicine today.  2. Macrobid was prescribed over the weekend by her primary care      Ellawyn Wogan for increased urination which  she is not currently having,      has several days left on this medication.  3. Albuterol as needed.  4. Metoprolol XL 100 mg p.o. daily.  5. Maxzide 25/37.5 mg p.o. daily.  6. Prilosec daily.  7. Amlodipine 10 mg p.o. daily.   PAST MEDICAL HISTORY:  Significant for:  1. Hypertension.  2. GERD.  3. Questionable reactive airways.  4. Ruled out for myocardial infarction in September 2009.  5. History of kidney stones.  6. Tobacco abuse.   FAMILY HISTORY:  Significant for diabetes, high blood pressure, and  breast cancer that occurred postmenopausally in her grandmother.  She  denies any family history of bleeding or clotting disorder.  She denies  any family history of stroke or coronary artery disease.   REVIEW OF SYSTEMS:  As per HPI.   PHYSICAL EXAMINATION:  VITAL SIGNS:  Temperature is 99.5, heart rate is  123 on albuterol neb treatment, respiratory rate is 26, blood pressure  is 125/81, and O2 saturation 93% on room air at the emergency  department.  GENERAL:  The patient is in no acute distress.  She is appropriate and  is able to participate with interview on exam.  HEENT:  Pupils are equal, round, and reactive to light.  Sclerae clear.  Oropharynx is pink and moist.  There is no erythema or exudate.  She  does exhibit some mild perioral cyanosis.   NECK:  There is no thyromegaly.  There is no cervical lymphadenopathy.  CARDIOVASCULAR:  The patient is tachycardic.  Rhythm is regular with  normal S1 and S2 with no murmur.  LUNGS:  The patient exhibits somewhat shallow breathing and she does  have mild expiratory wheeze diffusely.  There are no rales on  auscultation.  The patient is moving air throughout.  The patient is  speaking in complete sentences.  ABDOMEN:  Positive bowel sounds, soft, and nontender.  No masses.  EXTREMITIES:  A 2+ distal pulses.  There is no distal cyanosis.  There  is no edema.  SKIN:  Cap refills are less than 2 seconds.  Extremities are warm and  well perfused.   LABORATORY AND OTHER STUDIES:  Outside, CBC shows a white blood cell  count 5.4 with 84% neutrophils, hemoglobin 13.5, and platelet count of  250.  Chest x-ray PA and lateral from outside is read by me unofficially  and shows a questionable right middle lobe infiltrate that is not  impressive.   ASSESSMENT AND PLAN:  The patient is a 40 year old female with a several-  week history of respiratory symptoms presenting now with persistent  hypoxia.   1. Hypoxia.  Chest x-ray from outside is not convincing for pneumonia.      There is a possible right middle lobe infiltrate, although again      this unimpressive.  White blood cell count is not elevated, but      there is a slight left shift.  This could be a post infectious      inflammatory reactive process.  Less likely this could represent a      pulmonary process such as bronchiolitis obliterans organizing      pneumonia (BOOP) .  We will obtain an in-house PA and lateral chest      x-ray for a radiologist to read.  We will obtain a C-reactive      protein and routine lab work including CBC with differential and      CMP.  Pulmonary embolism is  low on the differential for this;      otherwise, healthy and ambulatory young female, and a D-dimer at      this point would not be of any clinical  value.  Consider obtaining      a pulmonary function test either as an inpatient or formally as an      outpatient.  Consider Pulmonology consult.  The patient has been      taking antibiotics x2 courses.  She is currently finishing a course      of Avelox.  We will continue this course to complete a 7-day course      and we will not start any new antibiotics at this time as the      patient does not clinically have an acute infection.  We will check      bronchioalveolar lavage with micro and culture and continue to      follow the patient clinically.  We will start IV Solu-Medrol 60 mg      t.i.d. to treat any reactive airway process that is ongoing.      Albuterol as needed.  We will also start Advair as a controller      medication for her pulmonary process.  We will obtain a walking      pulse oximetry x1 and provide supplemental oxygen as needed,      although none is needed at this time.  The patient does not exhibit      any evidence of immunocompromise at this time, but we will continue      to keep this in mind as a possible contributing factor.  2. Hypertension.  She is currently not hypertensive in the emergency      department.  We will continue her home medications and follow up      clinically.  3. FEN/GI.  We will give the patient a PPI as she is on one at home in      the form of Protonix.  We will saline lock her IV.  Now, we will      give her regular diet and check complete metabolic panel.  4. Disposition.  We will admit the patient to a respiratory isolation      regular bed.  Consider Pulmonology consult and discuss further      antibiotic treatment with the team and continue Avelox for now as      the patient does not seem to be acutely infected.  We will follow      the patient's clinical status with the initiation of steroids and      hopefully this will improve her current clinical state.      Myrtie Soman, MD  Electronically Signed      Nestor Ramp,  MD  Electronically Signed    TE/MEDQ  D:  05/28/2008  T:  05/29/2008  Job:  [id number]

## 2010-12-21 NOTE — Discharge Summary (Signed)
Laura Clay, Laura Clay                  ACCOUNT NO.:  [id number]   MEDICAL RECORD NO.:  [id number]          PATIENT TYPE:  INP   LOCATION:  3701                         FACILITY:  [facility]   PHYSICIAN:  Paula Compton, MD        DATE OF BIRTH:  1971-05-13   DATE OF ADMISSION:  04/24/2008  DATE OF DISCHARGE:  04/25/2008                               DISCHARGE SUMMARY   PRIMARY CARE Laura Clay:  Laura Sidle, MD at [facility].   DISCHARGE DIAGNOSES:  1. Hypertension.  2. Gastroesophageal reflux disease.  3. Kidney stones.  4. Postinfectious cough.  5. Tobacco use.  6. Costochondritis.  7. Musculoskeletal right abdominal pain.   DISCHARGE MEDICATIONS:  1. Metoprolol XL 100 mg daily.  2. Maxzide 25/37.5 mg daily.  3. Amlodipine 10 daily.  4. Omeprazole.  5. Tussionex p.r.n. cough.  6. Tessalon Perles 100 mg p.r.n. cough.  7. Flonase nasal spray.  8. Azithromycin 500 mg daily x3 days.   PROCEDURES:  Chest x-ray April 24, 2008, showing no acute  cardiopulmonary abnormalities with clear lungs.   LABORATORIES:  1. Urinalysis, specific gravity 1.035, pH 5.5 only significant for      urobilinogen of 0.2.  2. CBC, initial white blood cell count 13.5, hemoglobin 16.4,      hematocrit of 46.3, platelets 272, and neutrophil percentage 80%.  3. D-dimer 0.27 (within normal limits).  4. Comprehensive metabolic panel significant for glucose of 113, ALT      of 39, slightly elevated and ALT 27.  5. Cardiac enzymes within normal limits x1.   DISCHARGE LABORATORIES:  1. CBC, white blood cell count 11.9, hemoglobin 14.7, hematocrit 41.8,      and platelets 245.  2. Basic metabolic panel significant for potassium 3.0, note admission      potassium 3.8, otherwise within normal limits.   BRIEF HOSPITAL COURSE:  This is a 40 year old woman with past medical  history significant for hypertension who presents with chest pain.  1. Chest pain.  Upon admission, Laura Clay was  given an EKG which was      within normal limits.  Her cardiac enzymes were obtained and a D-      dimer was obtained all normal.  She remained normal on telemetry      overnight.  Her exam was significant for pain on the right sternal      border and pain when she moved her shoulder and arm.  The most      likely diagnosis is costochondritis, musculoskeletal secondary to      cough.  We asked her to avoid NSAIDs secondary to her hypertension      and we used Tylenol to relieve her pain upon discharge.  We feel      the likelihood of her having serious cardiac etiology of her chest      pain is very well and was felt safe to discharge.  2. Cough and wheeze.  This is likely postinfectious cough which can      persist up to 8 weeks, however,  it is possible she has an atypical      infection thus we will cover her with azithromycin for 3 days upon      discharge.  We also will provide her with Laura Clay in      hospital and upon discharge for relief of cough.  Her primary care      Laura Clay who was present this morning also provided with Tussionex      to help her relieve cough and pain associated with cough.  She will      follow up with her primary care Laura Clay additionally.  3. Hypertension.  She initially presented with blood pressures in the      140 systolic range over 100 diastolic.  Of note, in the primary      care Laura Clay's office, her blood pressures were very elevated      systolic above 200, this was likely secondary to not taking her      blood pressure medication for 3 weeks, plus frequent NSAID use,      plus pain, plus increased respirations.  By the morning of      discharge, her blood pressure was 110s over low 80s and 90s, much      improved.  Of note, she took her blood pressure medications the      morning prior to admission and received some blood pressure      medications in the evening in the hospital, thus she had doubled      the amount of blood pressure  medications, this information was      relayed to her primary care Laura Clay who made several blood      pressure medication changes including increasing amlodipine dose      from 5-10 and change in her hydrochlorothiazide to Maxzide.  Her      beta-blocker does remain the same.  We feel that she does not need      to obtain further workup for her hypertension as this is well-      controlled with her hypertensive medication.  The initial plan was      for an outpatient Doppler study of her renal artery stenosis,      however as noted previously we think this is unnecessary now.  4. Abdominal pain.  She upon presentation and the morning of      discharge, she had tenderness to palpation in the right lower      quadrant and left lower quadrant, however, her abdomen otherwise      was very unremarkable.  Benign, no rebound, no tenderness, soft      with no worrying signs.  The admitting diagnosis is musculoskeletal      pain secondary to coughing.  We provided her with Tylenol and      antitussives to help alleviate cough.  If this plan does not      sufficiently cover her pain, then we will refer her to her primary      care Laura Clay.  We also encouraged her to avoid NSAIDs as this      would precipitate her hypertension.  5. Low oxygen saturations in primary care Laura Clay's office.  As      reported, Laura Clay had an old oxygen sat with ambulation at her      primary care Laura Clay's office.  Her resting oxygen sats on      admission to the hospital were in the 90s.  She did not require  any      supplemental oxygen.  The morning prior to discharge, we measured      oxygen saturations with ambulation which remained consistently in      the high 90s.  6. Tobacco.  Laura Clay is an approximately 4 cigarettes per day      smoker, has been for many years.  She has been abstaining from      smoking within the last few weeks secondary to her cough and      generally not feeling well.  We  encouraged her to continue not      smoking and provided her with smoking cessation information.  She      should follow up with her primary care Laura Clay if she has any      further questions.   DISCHARGE INSTRUCTIONS:  1. Diet.  Low-salt, heart healthy diet secondary to hypertension.  2. Laura Clay was provided with extensive discharge instructions,      talks, and instructed when to return to the hospital.   FOLLOWUP APPOINTMENTS:  She has a followup appointment with Dr.  Milus Glazier on April 29, 2008, Tuesday in time after 7:45 in the  morning.   DISCHARGE CONDITION:  The patient was discharged to home in stable  medical condition.   NOTE:  Laura Clay Glazier of [facility] was present for the  discharge planning and participated in active planning and followup  talks with the patient.  He is in agreement with this plan.      Levander Campion, M.D.  Electronically Signed      Paula Compton, MD  Electronically Signed    JH/MEDQ  D:  04/25/2008  T:  04/26/2008  Job:  [id number]   cc:   Laura Clay, M.D.

## 2010-12-24 NOTE — Op Note (Signed)
[facility]  Patient:    Laura Clay, Laura Clay                         MRN: [id number] Proc. Date: 07/20/00 Adm. Date:  [id number] Attending:  Michaele Offer                           Operative Report  PREOPERATIVE DIAGNOSES: 1. Pelvic pain. 2. Left ovarian cyst.  POSTOPERATIVE DIAGNOSES: 1. Pelvic pain. 2. Pelvic adhesions. 3. Left ovarian cyst.  PROCEDURE:  Laparoscopic adhesiolysis and rupture/drainage of left ovarian cyst.  SURGEON:  Zenaida Niece, M.D.  ANESTHESIA:  General endotracheal tube.  ESTIMATED BLOOD LOSS:  Less than 50 cc.  FINDINGS:  Omentum adherent to the vaginal cuff.  There was a 3 cm left ovarian cyst, which drained clear, yellow fluid consistent with a corpus lesion.  Counts were correct.  Condition stable.  PROCEDURE IN DETAIL:  After appropriate informed consent was obtained, the patient was taken to the operating room and placed in the dorsosupine position.  General anesthesia was induced, then she was placed in low stirrups.  Her abdomen was then prepped and draped in the usual sterile fashion.  Her bladder drained with a red rubber catheter.  A sponge stick was inserted into the vagina for manipulation of the vaginal cuff.  Her infraumbilical skin was then infiltrated with 0.25% Marcaine.  Then a 1.5 cm horizontal incision was made.  A 10-11 disposable trocar was then introduced into the peritoneal cavity and placement confirmed by an opening pressure of 5 mmHg; also confirmed by the laparoscope.  A 5 mm port was placed in the midline two fingerbreadths above the symphysis pubis, under direct visualization with the laparoscope.  Inspection revealed the above-mentioned findings.  Omental adhesions to the vaginal cuff were taken down with scissors using monopolar cautery.  This achieved good reduction of the adhesions with hemostasis.  The right tube and ovary were freely mobile, and a few filmy adhesions were  lysed.  The left ovary was slightly adherent to the left pelvic sidewall.  A few adhesions were taken down sharply.  This achieved adequate mobilization of the ovary.  An avascular portion of the left ovarian cyst was incised after cautery with the monopolar scissors; the cyst again drained clear yellow fluid and was allowed to drain.  Since I did not feel this was a cyst which would recur, it was not resected.  Again now, the left ovary is fairly freely mobile, and there were no other significant adhesions.  The pelvis was irrigated with saline, which was then evacuated through the [place] irrigation system.  The 5 mm port was removed under direct visualization.  The scope was removed and then all gas was allowed to deflate from the abdomen.  The umbilical trocar was then removed.  All skin incisions were closed with interrupted subcuticular sutures of 4-0 Vicryl, followed by Steri-Strips and band-aids.  The patient was extubated in the operating room and taken to the recovery room in stable condition after tolerating the procedure well. DD:  07/20/00 TD:  07/21/00 Job: [id number]

## 2010-12-24 NOTE — H&P (Signed)
[place]  Patient:    Laura Clay, Laura Clay                         MRN: [id number] Adm. Date:  [id number] Attending:  Ronne Binning                         History and Physical  PREOPERATIVE DIAGNOSIS:  Triangular fibrocartilage complex tear - left wrist.  PREOPERATIVE DIAGNOSIS:  Triangular fibrocartilage complex tear - left wrist plus lunotriquetral tear plus peripheral detachment triangular fibrocartilage complex - left wrist.  OPERATION:  Arthroscopy, debridement TFCC, LT tear, repair of peripheral tear TFCC - left wrist.  SURGEON:  Nicki Reaper, M.D.  ASSISTANT:  Joaquin Courts, R.N.  ANESTHESIA:  General.  DATE OF OPERATION:  Dec 21, 2000  ANESTHESIOLOGIST:  Bedelia Person, M.D.  HISTORY:  The patient is a 40 year old female with a history of ulnar-sided wrist pain.  She is admitted now for arthroscopic inspection, debridement, and repair as dictated by findings.  MRI is positive for a peripheral tear.  PROCEDURE:  The patient is brought to the operating room where a general anesthetic was carried out without difficulty.  She was prepped and draped using Betadine scrub and solution with the left arm free.  The limb was placed in the arthroscopy tower, 10 pounds of traction applied, the joint inflated to the 3/4 portal.  An irrigation catheter was placed in 6U after opening the portal with a transverse incision, deepened with a hemostat.  Blunt trocar was used to enter the joint.  The joint was inspected.  The scapholunate ligament was intact, volar radial wrist ligaments were intact.  There was no articular damage.  The triangular fibrocartilage showed a small type 1A tear.  The periphery showed significant synovitis with an obvious detachment and flow sign.  The lunotriquetral joint showed a tear.  An ArthroWand was then used to coagulate the synovitis.  A shaver was then used to identify the peripheral tear.  The probe was able to be  placed beneath the triangular fibrocartilage and into the lunotriquetral joint.  The 4/5 portal was used after localization with a 22 gauge needle.  This was opened for application of the scope and the lunotriquetral tear was identified.  The mid carpal joint was then inspected. The STT joint was intact, the scapholunate joint was intact.  A mild instability was present to the lunotriquetral joint.  There was no change in the proximal hamate.  The dorsal capsule to the triquetrum was intact.  A scope was then introduced into 3/4 proximally.  The Tuohy needle was inserted after debriding the centralized portion of the TFCC.  A repair was then performed to the periphery of the triangular fibrocartilage with 2-0 Vicryl sutures.  An incision was made over the exit of the Tuohy needle, dissected down to the sheath, and care taken to protect the dorsal sensory nerve.  The suture was tied over the capsule showing excellent coaptation of the peripheral tear.  The instruments were removed, the portals and incision closed with interrupted 5-0 nylon suture, and sterile compressive dressing and long arm splint applied.  The patient tolerated the procedure well and was taken to the recovery room for observation in satisfactory condition.  DISPOSITION:  She is discharged home to return to [facility] [place] [facility] in one week on Vicodin and Keflex. DD:  12/21/00 TD:  12/21/00 Job:  [id number]

## 2010-12-24 NOTE — Discharge Summary (Signed)
NAME:  Laura Clay, Laura Clay NO.:  [id number]   MEDICAL RECORD NO.:  [id number]                   PATIENT TYPE:  INP   LOCATION:  3702                                 FACILITY:  [facility]   PHYSICIAN:  Ellie Lunch, M.D.                   DATE OF BIRTH:  13-Apr-1971   DATE OF ADMISSION:  01/19/2004  DATE OF DISCHARGE:  01/21/2004                                 DISCHARGE SUMMARY   PRIMARY CARE PHYSICIAN:  [facility].  She does not have one  particular doctor.   DISCHARGE DIAGNOSES:  1. Apical chest pain, most likely musculoskeletal in origin.  2. Low HDL.  3. Cervical cancer, status post total abdominal hysterectomy in 2001.   DISCHARGE MEDICATIONS:  1. Protonix 40 mg p.o. daily.  2. Ibuprofen 600 mg p.o. t.i.d. as needed for pain.  3. Fish oil supplements 1000 mg one tablet b.i.d.   PROCEDURE:  1. 2-D echo performed on January 20, 2004 showed normal left ventricular     systolic function with ejection fraction estimated at 55-65%, no left     ventricular regional wall abnormalities.  No pericardial effusion.  2. Ultrasound of the abdomen performed did not show any abnormalities.   HISTORY OF PRESENT ILLNESS:  Mrs. Lofland is a 40 year old white female with a  past medical history of cervical carcinoma who experienced substernal chest  pressure that started when she woke up on January 19, 2004.  The pain initially  started in the back but later spread to the front of her chest and was  associated with tingling numbness down the left arm.  The pain is aggravated  by inspiration and is not relieved by rest or nitroglycerin.  She also had  nausea and one episode of vomiting and experienced some chills.  She gives a  history of helping with building a fence two days prior to the episode.  She  also complains of pain in the right upper quadrant that started in the  morning and is not relieved by antacids.  The patient also gives history of  taking Dexatrim  Max two tablets every day for the past one month for weight  loss.  Has lost five pounds after starting the medication.   ALLERGIES:  MORPHINE.  The patient gets hives   SOCIAL HISTORY:  The patient is a smoker, smokes one pack per week and has  been doing so for four years.  She is a social drinker, drinks two drinks a  week.  No cocaine or IV drug use.  The patient is married.  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   FAMILY HISTORY:  Mother has hypertension.  Father has diabetes, heart  failure, renal failure, and died at age 33 due to renal failure.  She has  one sister who is 3 years old and  has macular degeneration.  She has two  girls ages 64 and 4, both with no medical problems.   REVIEW OF SYSTEMS:  Positive for fevers, chills, weight loss, fatigue, chest  pain, palpitations, dyspnea, nausea or vomiting, weakness, numbness.   PHYSICAL EXAMINATION:  VITAL SIGNS:  Pulse 120, blood pressure 155/24,  temperature 98.1, respirations 22, O2 saturation 100% on room air.  GENERAL:  No abnormalities.  RESPIRATORY:  Clear to auscultation bilaterally.  CARDIOVASCULAR:  Regular rate and rhythm.  No clubbing, cyanosis or edema.  GI:  Soft, nondistended, positive bowel sounds.  Tenderness located in the  right upper quadrant and no guarding or rigidity.  EXTREMITIES:  No clubbing, cyanosis or edema.   ADMISSION LABORATORY DATA:  Sodium 138, potassium 4, chloride 109, bicarb  21, BUN 9, creatinine 0.9, glucose 98, hemoglobin 13.9, with MCV of 88.6.  WBC 9 with ANC of 2.7, platelets 26.  Bilirubin 0.8, alk phos 64, SGOT 26,  protein 6.7, albumin 3.4, calcium 8.6.  Point of care markers x 3 have been  negative.  EKG shows sinus tachycardia.  Chest x-ray with no abnormalities.   HOSPITAL COURSE:  Problem #1.  Atypical chest pain.  The patient was  admitted to be ruled out for MI.  Three sets of cardiac enzymes were  obtained and were negative.  The EKG did  not show any ischemic changes.  The  patient also received a 2-D echo which did not show any abnormalities.  Since the patient was pleuritic, a chest x-ray was also obtained which did  not show any infiltrates.  A D-dimer was obtained to rule out PE and was  0.48 which was within normal limits.  The pain was also not relieved on  Protonix.  Thus, the pain may be most likely secondary to musculoskeletal  pain from the increased activities prior to admission.   Problem #2.  Right upper quadrant pain.  An abdominal ultrasound was  obtained to rule out any gallbladder disease, even though liver function  tests were normal.  The abdominal ultrasound was within normal limits.  She  did not have any gallbladder disease.   Problem #3.  Low HDL.  Fasting lipids were obtained as part of cardiac work-  up and as follows:  Cholesterol 165, triglycerides 131, HDL 36, LDL 103. The  patient has been advised to take ____________1000 mg twice a day and has  been advised on getting a fasting lipid panel checked every year.   DISCHARGE LABORATORY DATA:  TSH 4.943.  D-dimer 0.26.  Lipid panel as noted  above.   DISCHARGE INSTRUCTIONS:  The patient has been instructed to come to the ER  if she starts to develop severe chest pain.  She has also been instructed to  follow up with her primary care doctor at [facility] for a  fasting lipid panel.  She has also been advised to stop taking the Dexatrim  Max since that could be a potential cause of the pain as well and has been  given nutrition and exercise education for losing weight.                                                Ellie Lunch, M.D.    PB/MEDQ  D:  01/21/2004  T:  01/21/2004  Job:  [id number]

## 2010-12-24 NOTE — Op Note (Signed)
[name], Laura Clay                  ACCOUNT NO.:  [id number]   MEDICAL RECORD NO.:  [id number]          PATIENT TYPE:  AMB   LOCATION:  SDC                           FACILITY[facility]  [facility]   PHYSICIAN:  Zenaida Niece, M.D.DATE OF BIRTH:  [birth year]   DATE OF PROCEDURE:  12/16/2005  DATE OF DISCHARGE:                                 OPERATIVE REPORT   PREOPERATIVE AND POSTOPERATIVE DIAGNOSES:  Pelvic pain.   PROCEDURES:  Diagnostic laparoscopy with minimal adhesiolysis.   SURGEON:  Zenaida Niece, M.D.   ANESTHESIA:  General endotracheal tube.   SPECIMENS:  None.   ESTIMATED BLOOD LOSS:  Minimal.   COMPLICATIONS:  None.   FINDINGS:  She had no significant adhesions in the pelvis.  Both ovaries  looked normal with simple cyst on the left side.  Both ovaries were  minimally adhesed towards the vaginal cuff.   PROCEDURE IN DETAIL:  The patient was taken to the operating room and placed  in the dorsosupine position.  General anesthesia was induced and she was  placed in mobile stirrups.  Abdomen was prepped and draped in the usual  sterile fashion and bladder drained with a red rubber catheter.  Infraumbilical skin was infiltrated with quarter percent Marcaine along her  previous incision.  A 1.5 cm horizontal incision was then made.  The Veress  needle was inserted in the peritoneal cavity and placement confirmed by the  water drop test and opening pressure of 4 mmHg.  CO2 gas was insufflated to  a pressure of 13 and the Veress needle was removed.  The laparoscope was  then inserted under direct visualization with a size 11 XL trocar with  excellent placement.  5 mm port was then placed on the left side also under  direct visualization.  Inspection revealed the above-mentioned findings.  No  significant source for her pain was identified.  Both ovaries were minimally  adhesed distally towards the vaginal cuff.  Using bipolar cautery, these  minimal adhesions were coagulated and  then cut.  Bleeding was controlled  with electrocautery again.  I did not feel this was a significant cause of  pain, so I did not pursue this aggressively.  No other source for her pelvic  pain was identified and she did not have significant adhesions or evidence  of endometriosis.  The 5 mm port was removed under direct visualization.  The laparoscope was removed and all gas allowed to deflate from the  abdomen.  The umbilical trocar was then removed.  Skin incisions were closed  with interrupted subcuticular sutures of 4-0 Vicryl followed by Dermabond.  The patient was extubated in the operating room and taken to the recovery  room in stable condition after tolerating the procedure well.  Counts were  correct.      Zenaida Niece, M.D.  Electronically Signed     TDM/MEDQ  D:  12/16/2005  T:  12/17/2005  Job:  [id number]

## 2010-12-24 NOTE — H&P (Signed)
Laura Clay, Laura Clay                  ACCOUNT NO.:  [id number]   MEDICAL RECORD NO.:  [id number]          PATIENT TYPE:  AMB   LOCATION:  SDC                           [facility]ACILITY[facility]  [facility]   PHYSICIAN:  Zenaida Niece, M.D.DATE OF BIRTH:  1971-05-09   DATE OF ADMISSION:  12/16/2005  DATE OF DISCHARGE:                                HISTORY & PHYSICAL   CHIEF COMPLAINT:  Recurrent pelvic pain.   HISTORY OF PRESENT ILLNESS:  This is a 40 year old white female para 2-0-0-2  who was seen for an annual exam in January 2007.  At that time, she was  having pelvic pain and dyspareunia with a history of a right ovarian cyst.  Repeat ultrasound performed in April revealed resolution of the right  ovarian cyst and a small left ovarian cyst consistent with normal ovarian  function.  However, she continues to have pelvic pain.  Examination just  reveals mild diffuse lower abdominal tenderness and no adnexal or pelvic  masses.  All options have been discussed with the patient and she wishes to  proceed with laparoscopy and possible adhesiolysis for pelvic pain and  dyspareunia.   PAST OBSTETRICS HISTORY:  Significant for two vaginal deliveries at term  without complications.   PAST MEDICAL HISTORY:  1.  Exercise induced asthma.  2.  History of cystitis.  3.  Mild hypertension.   PAST SURGICAL HISTORY:  1.  Bunionectomy.  2.  In 2000, she had a vaginal hysterectomy and cystoscopy due to recurrent      CIN3.  3.  In 2001, she underwent laparoscopy with adhesiolysis and      rupture/drainage of a left ovarian cyst for pelvic pain.   ALLERGIES:  MORPHINE.   CURRENT MEDICATIONS:  Toprol.   REVIEW OF SYSTEMS:  Normal bowel and bladder function.   FAMILY HISTORY:  Maternal grandmother with breast cancer and maternal aunt  with ovarian cancer.   SOCIAL HISTORY:  She is married and does smoke about five cigarettes a day  and denies alcohol or drug use.   PHYSICAL EXAMINATION:  GENERAL  APPEARANCE:  This is a well-developed white  female in no acute distress.  VITAL SIGNS:  Weight 164, blood pressure 140/100.  NECK:  Supple without lymphadenopathy or thyromegaly.  LUNGS:  Clear to auscultation.  HEART:  Regular rate and rhythm without murmur.  ABDOMEN:  Soft, nondistended without palpable masses, and she does have mild  diffuse lower abdominal tenderness.  EXTREMITIES:  No edema and are nontender.  PELVIC:  External genitalia currently has no lesions.  She has had  irritations at the clitoris in the past, but this has currently improved.  On speculum exam, she has a normal healed vaginal cuff and on bimanual exam  she has no adnexal masses and mild tenderness.   ASSESSMENT:  Recurrent pelvic pain with a history of pelvic adhesions.  All  options have been discussed with the patient, and she wishes to proceed with  laparoscopy with lysis of adhesions.  Risks of surgery have been discussed  with the patient, and she understands.  PLAN:  Admit the patient on the day of surgery for laparoscopy with probable  adhesiolysis.      Zenaida Niece, M.D.  Electronically Signed     TDM/MEDQ  D:  12/15/2005  T:  12/15/2005  Job:  [id number]

## 2011-02-20 ENCOUNTER — Emergency Department (HOSPITAL_COMMUNITY): Payer: Self-pay

## 2011-02-20 ENCOUNTER — Observation Stay (HOSPITAL_COMMUNITY)
Admission: EM | Admit: 2011-02-20 | Discharge: 2011-02-21 | Disposition: A | Discharge status: Home / Self Care | Payer: Self-pay | Attending: Internal Medicine | Admitting: Internal Medicine

## 2011-02-20 DIAGNOSIS — J45909 Unspecified asthma, uncomplicated: Secondary | ICD-10-CM | POA: Insufficient documentation

## 2011-02-20 DIAGNOSIS — R0602 Shortness of breath: Secondary | ICD-10-CM | POA: Insufficient documentation

## 2011-02-20 DIAGNOSIS — Z79899 Other long term (current) drug therapy: Secondary | ICD-10-CM | POA: Insufficient documentation

## 2011-02-20 DIAGNOSIS — K219 Gastro-esophageal reflux disease without esophagitis: Secondary | ICD-10-CM | POA: Insufficient documentation

## 2011-02-20 DIAGNOSIS — F17200 Nicotine dependence, unspecified, uncomplicated: Secondary | ICD-10-CM | POA: Insufficient documentation

## 2011-02-20 DIAGNOSIS — I10 Essential (primary) hypertension: Secondary | ICD-10-CM | POA: Insufficient documentation

## 2011-02-20 DIAGNOSIS — Z87442 Personal history of urinary calculi: Secondary | ICD-10-CM | POA: Insufficient documentation

## 2011-02-20 DIAGNOSIS — Z90710 Acquired absence of both cervix and uterus: Secondary | ICD-10-CM | POA: Insufficient documentation

## 2011-02-20 DIAGNOSIS — R0789 Other chest pain: Principal | ICD-10-CM | POA: Insufficient documentation

## 2011-02-20 LAB — COMPREHENSIVE METABOLIC PANEL
ALT: 43 U/L — ABNORMAL HIGH (ref 0–35)
AST: 28 U/L (ref 0–37)
Albumin: 3.7 g/dL (ref 3.5–5.2)
Alkaline Phosphatase: 76 U/L (ref 39–117)
Potassium: 3.8 mEq/L (ref 3.5–5.1)
Sodium: 135 mEq/L (ref 135–145)
Total Protein: 7.7 g/dL (ref 6.0–8.3)

## 2011-02-20 LAB — URINALYSIS, ROUTINE W REFLEX MICROSCOPIC
Bilirubin Urine: NEGATIVE
Hgb urine dipstick: NEGATIVE
Protein, ur: NEGATIVE mg/dL
Urobilinogen, UA: 0.2 mg/dL (ref 0.0–1.0)

## 2011-02-20 LAB — DIFFERENTIAL
Eosinophils Absolute: 0.4 10*3/uL (ref 0.0–0.7)
Eosinophils Relative: 5 % (ref 0–5)
Lymphs Abs: 2.6 10*3/uL (ref 0.7–4.0)
Monocytes Relative: 9 % (ref 3–12)

## 2011-02-20 LAB — CBC
HCT: 41.8 % (ref 36.0–46.0)
MCH: 32.1 pg (ref 26.0–34.0)
MCV: 91.9 fL (ref 78.0–100.0)
Platelets: 184 10*3/uL (ref 150–400)
RDW: 12.9 % (ref 11.5–15.5)

## 2011-02-20 LAB — PREGNANCY, URINE: Preg Test, Ur: NEGATIVE

## 2011-02-20 LAB — TROPONIN I: Troponin I: <0.3 ng/mL (ref <0.30)

## 2011-02-20 LAB — PRO B NATRIURETIC PEPTIDE: Pro B Natriuretic peptide (BNP): 164.7 pg/mL — ABNORMAL HIGH (ref 0–125)

## 2011-02-21 ENCOUNTER — Inpatient Hospital Stay (HOSPITAL_COMMUNITY): Payer: Self-pay

## 2011-02-21 LAB — BASIC METABOLIC PANEL
BUN: 11 mg/dL (ref 6–23)
CO2: 26 mEq/L (ref 19–32)
Calcium: 8.7 mg/dL (ref 8.4–10.5)
Chloride: 102 mEq/L (ref 96–112)
Creatinine, Ser: 0.84 mg/dL (ref 0.50–1.10)
GFR calc Af Amer: >60 mL/min (ref >60)
GFR calc non Af Amer: >60 mL/min (ref >60)
Glucose, Bld: 88 mg/dL (ref 70–99)
Potassium: 3.9 mEq/L (ref 3.5–5.1)
Sodium: 135 mEq/L (ref 135–145)

## 2011-02-21 LAB — CK TOTAL AND CKMB (NOT AT ARMC)
CK, MB: 1.4 ng/mL (ref 0.3–4.0)
CK, MB: 1.6 ng/mL (ref 0.3–4.0)
CK, MB: 1.8 ng/mL (ref 0.3–4.0)
Relative Index: INVALID (ref 0.0–2.5)
Relative Index: INVALID (ref 0.0–2.5)
Relative Index: INVALID (ref 0.0–2.5)
Total CK: 81 U/L (ref 7–177)
Total CK: 77 U/L (ref 7–177)
Total CK: 89 U/L (ref 7–177)

## 2011-02-21 LAB — TROPONIN I
Troponin I: <0.3 ng/mL (ref <0.30)
Troponin I: <0.3 ng/mL (ref <0.30)
Troponin I: <0.3 ng/mL (ref <0.30)

## 2011-02-21 LAB — TSH: TSH: 2.551 u[IU]/mL (ref 0.350–4.500)

## 2011-02-21 LAB — ANA: Anti Nuclear Antibody(ANA): NEGATIVE

## 2011-02-21 NOTE — Discharge Summary (Signed)
Laura Clay, Laura Clay NO.:  [id number]  MEDICAL RECORD NO.:  [id number]  LOCATION:  1423                         FACILITY:  [facility]  PHYSICIAN:  Laura Blower, MD       DATE OF BIRTH:  May 03, 1971  DATE OF ADMISSION:  02/20/2011 DATE OF DISCHARGE:                              DISCHARGE SUMMARY   PRIMARY CARE PHYSICIAN:  Laura Clay, M.D. at [facility], was previously Dr. Lenord Clay.  PRIMARY CARDIOLOGIST:  [facility].  DISCHARGE DIAGNOSES: 1. Chest pain, was ruled out for acute coronary syndrome. 2. Hypertension. 3. History of asthma. 4. Gastroesophageal reflux disease. 5. Tobacco use. 6. History of nephrolithiasis. 7. Status post hysterectomy. 8. Status post exploratory laparotomy. 9. History of bronchitis.  DISCHARGE MEDICATIONS: 1. Aspirin 81 mg p.o. daily 2. Albuterol nebulizer 1 vial every 4 hours as needed for shortness of     breath. 3. Bisoprolol 5 mg daily. 4. Ibuprofen 600 mg every 8 hours as needed for pain and headache. 5. Omeprazole 20 mg p.o. daily. 6. Prednisone taper continue as defined prior to hospitalization. 7. Symbicort 160/4.5 mcg 2 puffs daily. 8. Albuterol inhaler 2 puffs every 4 hours as needed.  DISPOSITION AND FOLLOWUP: 1. The patient to follow up with Dr. Herbie Clay with [facility] and     [facility]ascular on February 22, 2011, at 2 p.m.  This is conveyed to the     patient. 2. The patient to follow up with Dr. Milus Clay, primary care     physician, in 1 week as needed.  BRIEF ADMITTING HISTORY AND PHYSICAL:  [name] is a 40 year old Caucasian female with history of hypertension, asthma who presented to the ER with complaints of chest pain that was sudden onset.  RADIOLOGY/IMAGING:  The patient had chest x-ray two-view which showed normal chest.  LABORATORY DATA:  CBC shows a white count of 8.2, hemoglobin 14.6, hematocrit 41.8, platelet count 184, D-dimer 0.22.  Electrolytes normal with a BUN  of 11, creatinine 0.84.  Liver function tests normal except ALT is 43.  Troponins negative x4.  BNP 165.  UA is negative for nitrites and leukocytes.  Urine pregnancy is negative.  HOSPITAL COURSE BY PROBLEM: 1. Chest pain.  The patient was admitted and was ruled out for acute     coronary syndrome.  The patient's chest pain improved during the     course of hospital stay.  D-dimer was normal indicating low     likelihood for PE.  Chest x-ray was negative for pneumonia. On     my examination the patient did not have tenderness to palpation     on the sternum indicating low likelihood for costochondritis.     The patient did report that she had a stress test done about     1-1/2 half years ago at [facility] and vascular.     Contacted [facility] who recommended     arranging for outpatient followup with the patient     with Dr. Herbie Clay on February 22, 2011, tomorrow, at 2:00 p.m..  The     patient expressed concern on how she could afford for  the     outpatient followup.  I passed the concern on to [facility]     and [facility] who instructed the patient to call their office to     arrange for financial considerations. 2. Hypertension, uncontrolled.  The patient reports that she has ran     out of her medications.  She reports that she does have a     prescription of bisoprolol as an outpatient and instructed her to     continue bisoprolol and further titration of antihypertensive     medications as necessary.  During the course hospital stay the     patient was started on lisinopril at 20 mg and hydrochlorothiazide     12.5 mg, this was discontinued as the patient will resume     bisoprolol as an outpatient.  The patient was also placed on     diltiazem during the course of hospital stay which was     discontinued. 3. History of asthma.  The patient was on PRN dose prednisone taper     as an outpatient which she will start if she has asthma. 4. GERD.   Continue the patient on PPI. 5. Tobacco use.  Encouraged smoking cessation.  Time spent on discharge, talking to the patient and coordinating care was 25 minutes.   Laura Blower, MD   SR/MEDQ  D:  02/21/2011  T:  02/21/2011  Job:  [id number]  Electronically Signed by Wardell Heath Mikesha Migliaccio  on 02/21/2011 05:49:44 PM

## 2011-03-09 NOTE — H&P (Signed)
Laura Clay, Laura Clay                  ACCOUNT NO.:  [id number]  MEDICAL RECORD NO.:  [id number]  LOCATION:  [facility]                         FACILITY:  [facility]  PHYSICIAN:  Celso Amy, MD   DATE OF BIRTH:  Oct 11, 1970  DATE OF ADMISSION:  02/20/2011 DATE OF DISCHARGE:                             HISTORY & PHYSICAL   PRIMARY CARE PHYSICIAN:  The patient states she has not seen her primary care and has not refilled her medications.  CHIEF COMPLAINT:  Chest pain.  HISTORY OF PRESENT ILLNESS:  The patient is a 40 year old white female with past medical history of hypertension, asthma, who presented to ER with chief complaint of chest pain.  History of present illness dates back to 1 p.m. today when the patient had sudden onset of chest pain, midsternal in location, 8/10 in intensity, feels like squeezing, associated with tingling and numbness in left arm.  Chest pain increased on cough.  It also increased on deep breath.  The patient is not associated with any relating factors, besides on medications.  The patient also complains of headache.  The patient complains of new onset gradually progressing shortness of breath.  The patient complains of lightheadedness.  No complaint of fever, cough, or chills.  No complaints of nausea, vomiting, or diarrhea.  No complaint of heart burn.  The patient states that she does have GERD but can interfere.  No history of sick contact.  No history of trauma.  No history of recent travel.  The patient states that she has been out of her antihypertensive medication and was not taking any.  ALLERGIES:  The patient is allergic to MORPHINE causing hives.  SOCIAL HISTORY:  The patient continues to smoke, drinks occasionally, denies illegal drug abuse.  FAMILY HISTORY:  Positive for diabetes mellitus and hypertension.  Her mother had history of CABG.  Her father had history of congestive heart failure.  REVIEW OF SYSTEMS:  Negative.  PAST MEDICAL  HISTORY: 1. Positive for asthma. 2. Hypertension. 3. Nephrolithiasis. 4. GERD. 5. Tobacco abuse. 6. Status post hysterectomy. 7. Status post exploratory laparotomy.  MEDICATIONS:  Medications that the patient is supposed to be on are: 1. Metoprolol 5 mg p.o. daily. 2. Symbicort 160/4.5 2 puffs b.i.d. 3. Albuterol on p.r.n. basis. 4. Omeprazole 20 mg p.o. daily.  PHYSICAL EXAMINATION:  VITAL SIGNS:  On physical examination, the patient's blood pressure is 164/113, pulse 70, respiratory rate 20, temperature afebrile. GENERAL:  The patient is awake, alert, oriented to time, place, and person.  Resting comfortably on the bed.  She is well build, well nourished. HEENT:  Pupils are equal, round, and reactive to light and accommodation.  extraocular movements are intact.  Head is atraumatic and normocephalic. RESPIRATORY:  No acute respiratory distress. CHEST:  Clear to auscultation bilaterally.  The patient's sternum on the left side is tender to palpation. CARDIOVASCULAR:  S1 and S2 regular rate and rhythm.  No murmurs are appreciated. GASTROINTESTINAL:  Bowel sounds present.  Abdomen is Soft, nontender, nondistended. EXTREMITIES:  No lower extremity edema. CENTRAL NERVOUS SYSTEM:  Cranial nerves II-XII are grossly intact.  No focal motor deficits.  Strength is equal both upper and  lower extremities, 5 out of 5. PSYCHIATRIC:  The patient has normal mood and affect.  LABORATORY DATA:  The patient's pregnancy test is negative.  The patient's CMP, sodium 135, potassium is 3.8, chloride 98, bicarb 23, glucose 88, BUN 13, serum creatinine 0.86, total bilirubin 0.4, ALP 76, AST 38, ALT 43, albumin 3.7.  The patient's hemoglobin is 14.6, platelets were 184000.  UA is negative.  Chest x-ray was normal.  The EKG showed normal sinus rhythm.  D-dimer was 0.22.  BNP 164.  Trops less than 0.30.  IMPRESSION: 1. Chest pain.  The patient is being admitted with chest pain.     Etiology, it  might could be respiratory versus cardiac versus     musculoskeletal.  It could be respiratory from pleurisy.  This     could be cardiac as the patient has a family history of coronary     artery disease plus risk factor of hypertension. This could be     atypical chest pain.  It could be costochondritis because the exam     shows that the sternum is tender.  It could be cervical, especially     given the history of tingling in the left arm. 2. Hypertension is uncontrolled.  The patient is noncompliant with     medications and continues to smoke. 3. Respiratory.  The patient has a history of asthma, right now     stable. 4. Deep venous thrombosis.  Will keep the patient on deep venous     thrombosis prophylaxis.  PLAN: 1. Will admit to tele. 2. Will cycle trops. 3. Will ask for C-spine x-rays. 4. Will start lidocaine patch for pain. 5. Will start on aspirin. 6. Will start on gabapentin. 7. Will get ANA as a part of workup. 8. Will get a 2D echo as The patient's BNP is unexpectedly higher. 9. The patient's further clinical course depends how the patient does     with treatment plan.     Celso Amy, MD     MB/MEDQ  D:  02/20/2011  T:  02/20/2011  Job:  [id number]  Electronically Signed by Celso Amy M.D. on 03/09/2011 06:07:04 PM

## 2011-05-09 LAB — URINALYSIS, MICROSCOPIC ONLY
Glucose, UA: 250 — AB
Glucose, UA: NEGATIVE
Hgb urine dipstick: NEGATIVE
Ketones, ur: 15 — AB
Leukocytes, UA: NEGATIVE
Nitrite: NEGATIVE
Protein, ur: NEGATIVE
Protein, ur: NEGATIVE
Urobilinogen, UA: 0.2

## 2011-05-09 LAB — CBC
HCT: 41.8
HCT: 46.3 — ABNORMAL HIGH
HCT: 40.8
Hemoglobin: 16.4 — ABNORMAL HIGH
Hemoglobin: 13.7
Hemoglobin: 14.2
MCHC: 35.3
MCHC: 34.8
MCV: 91.3
MCV: 91.7
MCV: 92.8
Platelets: 245
Platelets: 216
RBC: 5.07
RDW: 12.1
RDW: 12.5
RDW: 12.4
RDW: 12.5

## 2011-05-09 LAB — DIFFERENTIAL
Eosinophils Absolute: 0
Eosinophils Absolute: 0
Eosinophils Relative: 0
Lymphocytes Relative: 15
Lymphs Abs: 2
Lymphs Abs: 0.3 — ABNORMAL LOW
Monocytes Absolute: 0.1
Monocytes Relative: 3
Neutro Abs: 10.8 — ABNORMAL HIGH
Neutrophils Relative %: 80 — ABNORMAL HIGH

## 2011-05-09 LAB — URINE CULTURE: Special Requests: NEGATIVE

## 2011-05-09 LAB — CARDIAC PANEL(CRET KIN+CKTOT+MB+TROPI)
Relative Index: INVALID
Total CK: 80
Troponin I: <0.01

## 2011-05-09 LAB — BASIC METABOLIC PANEL
BUN: 13
CO2: 26
Chloride: 103
Chloride: 96
Creatinine, Ser: 1.04
Creatinine, Ser: 0.78
GFR calc Af Amer: >60
GFR calc non Af Amer: >60
Glucose, Bld: 95
Glucose, Bld: 145 — ABNORMAL HIGH
Potassium: 3 — ABNORMAL LOW
Potassium: 4.4
Sodium: 133 — ABNORMAL LOW

## 2011-05-09 LAB — CULTURE, BLOOD (ROUTINE X 2)
Culture: NO GROWTH
Culture: NO GROWTH
Culture: NO GROWTH

## 2011-05-09 LAB — URINALYSIS, ROUTINE W REFLEX MICROSCOPIC
Bilirubin Urine: NEGATIVE
Glucose, UA: NEGATIVE
Ketones, ur: NEGATIVE
Nitrite: NEGATIVE
pH: 5.5

## 2011-05-09 LAB — COMPREHENSIVE METABOLIC PANEL
ALT: 39 — ABNORMAL HIGH
ALT: 30
Albumin: 3.5
Alkaline Phosphatase: 63
BUN: 14
CO2: 24
Calcium: 9.5
Creatinine, Ser: 0.93
GFR calc non Af Amer: >60
Glucose, Bld: 113 — ABNORMAL HIGH
Potassium: 2.8 — ABNORMAL LOW
Sodium: 135
Total Protein: 7.7
Total Protein: 6.9

## 2011-05-09 LAB — TSH: TSH: 0.624

## 2011-05-09 LAB — D-DIMER, QUANTITATIVE: D-Dimer, Quant: 0.27

## 2011-07-26 ENCOUNTER — Ambulatory Visit: Payer: Self-pay | Admitting: Family Medicine

## 2012-03-06 ENCOUNTER — Other Ambulatory Visit: Payer: Self-pay | Admitting: Family Medicine

## 2012-04-26 ENCOUNTER — Ambulatory Visit: Payer: Self-pay | Admitting: Family Medicine

## 2012-04-27 ENCOUNTER — Ambulatory Visit: Payer: Self-pay | Admitting: Family Medicine

## 2012-05-04 ENCOUNTER — Ambulatory Visit: Payer: Self-pay | Admitting: Family Medicine

## 2012-05-04 ENCOUNTER — Encounter: Payer: Self-pay | Admitting: Family Medicine

## 2012-05-04 VITALS — BP 140/102 | HR 106 | Temp 99.0°F | Resp 16 | Ht 64.5 in | Wt 167.4 lb

## 2012-05-04 DIAGNOSIS — F411 Generalized anxiety disorder: Secondary | ICD-10-CM

## 2012-05-04 DIAGNOSIS — I10 Essential (primary) hypertension: Secondary | ICD-10-CM

## 2012-05-04 DIAGNOSIS — F4321 Adjustment disorder with depressed mood: Secondary | ICD-10-CM

## 2012-05-04 DIAGNOSIS — J45909 Unspecified asthma, uncomplicated: Secondary | ICD-10-CM

## 2012-05-04 MED ORDER — ALPRAZOLAM 0.5 MG PO TABS: ORAL_TABLET | ORAL | Status: DC

## 2012-05-04 MED ORDER — LISINOPRIL-HYDROCHLOROTHIAZIDE 10-12.5 MG PO TABS: 1.0000 | ORAL_TABLET | Freq: Every day | ORAL | Status: DC

## 2012-05-08 DIAGNOSIS — I10 Essential (primary) hypertension: Secondary | ICD-10-CM | POA: Insufficient documentation

## 2012-05-08 NOTE — Progress Notes (Signed)
  Subjective:    Patient ID: Laura Clay, female    DOB: 25-Jun-1971, 41 y.o.   MRN: [id number]  HPI  This 41 yo.o female recently lost her husband to cirrhosis; she states he received the diagnosis  and within 2 months, he passed away on 04-30-2012.There are 2 teenage daughters in the home.  The husband did have alcohol-induced cirrhosis. The family is with Hospice; the daughters will begin  counseling through [facility] and the pt will start counseling soon also, She works from home and  is having problems with emotional instability and fatigue, lack of concentration,insomnia,etc.    Review of Systems  Constitutional: Positive for activity change, appetite change and fatigue. Negative for fever and unexpected weight change.  Respiratory: Positive for shortness of breath. Negative for cough, chest tightness and wheezing.   Cardiovascular: Positive for palpitations. Negative for chest pain and leg swelling.  Gastrointestinal: Positive for nausea. Negative for vomiting and abdominal pain.  Neurological: Positive for dizziness and headaches. Negative for syncope, weakness and numbness.  Psychiatric/Behavioral: Positive for disturbed wake/sleep cycle, dysphoric mood and decreased concentration. Negative for suicidal ideas, behavioral problems and agitation. The patient is nervous/anxious.        Objective:   Physical Exam  Nursing note and vitals reviewed. Constitutional: She is oriented to person, place, and time. She appears well-developed and well-nourished. She appears distressed.  HENT:  Head: Normocephalic and atraumatic.  Eyes: Conjunctivae normal and EOM are normal. No scleral icterus.  Cardiovascular: Normal rate and regular rhythm.   Pulmonary/Chest: Effort normal. No respiratory distress.  Musculoskeletal: Normal range of motion.  Neurological: She is alert and oriented to person, place, and time. No cranial nerve deficit. Coordination normal.  Skin: Skin is warm and dry.    Psychiatric: Her speech is normal. Judgment and thought content normal. Her mood appears not anxious. Her affect is labile. Her affect is not angry and not inappropriate. She is not agitated, is not hyperactive, not slowed and not withdrawn. Cognition and memory are normal. She exhibits a depressed mood. She expresses no homicidal and no suicidal ideation. She is attentive.          Assessment & Plan:   1. HTN (hypertension)  Discontinue Bisoprolol (pt has Asthma and this med may be exacerbating disease); D/C HCTZ 25 mg  New med: Lisinopril-HCTZ 10-12.5 mg  #90  With RFs  2. Anxiety as acute reaction to gross stress   Follow through with Hospice Counseling for self and daughters  RX: Alp[razolam 0.5 mg  1/2 tab in AM and at midday prn anxiety; take 1 tab at bedtime for sleep.

## 2012-05-30 ENCOUNTER — Encounter: Payer: Self-pay | Admitting: Family Medicine

## 2012-06-15 ENCOUNTER — Ambulatory Visit: Payer: Self-pay | Admitting: Family Medicine

## 2012-06-22 ENCOUNTER — Encounter: Payer: Self-pay | Admitting: Family Medicine

## 2012-06-22 ENCOUNTER — Ambulatory Visit: Payer: Self-pay | Admitting: Family Medicine

## 2012-06-22 VITALS — BP 132/94 | HR 125 | Temp 100.0°F | Resp 18 | Ht 65.0 in | Wt 159.0 lb

## 2012-06-22 DIAGNOSIS — S81009A Unspecified open wound, unspecified knee, initial encounter: Secondary | ICD-10-CM

## 2012-06-22 DIAGNOSIS — F341 Dysthymic disorder: Secondary | ICD-10-CM

## 2012-06-22 DIAGNOSIS — Z23 Encounter for immunization: Secondary | ICD-10-CM

## 2012-06-22 DIAGNOSIS — S81811A Laceration without foreign body, right lower leg, initial encounter: Secondary | ICD-10-CM

## 2012-06-22 DIAGNOSIS — I10 Essential (primary) hypertension: Secondary | ICD-10-CM

## 2012-06-22 DIAGNOSIS — F418 Other specified anxiety disorders: Secondary | ICD-10-CM

## 2012-06-22 MED ORDER — SERTRALINE HCL 50 MG PO TABS: 50.0000 mg | ORAL_TABLET | Freq: Every day | ORAL | Status: DC

## 2012-06-22 MED ORDER — AMOXICILLIN-POT CLAVULANATE 875-125 MG PO TABS: 1.0000 | ORAL_TABLET | Freq: Two times a day (BID) | ORAL | Status: DC

## 2012-06-22 MED ORDER — LISINOPRIL-HYDROCHLOROTHIAZIDE 10-12.5 MG PO TABS: 1.0000 | ORAL_TABLET | Freq: Every day | ORAL | Status: DC

## 2012-06-22 NOTE — Progress Notes (Signed)
S:  Pt comes in today for medication review. HTN is controlled on current medications w/o side effects. She is having a lot of sleep disturbance,having recently lost her husband to cirrhosis. Alprazolam helps with anxiety but pt think she needs an anti-depressant. Symptoms include crying spells, irritability, lack of focus, mild anhedonia and fatigue. She is back at work full time.   Pt also has a minor injury to right calf; she was taking out the trash and a broken piece of glass pierced the garbage bag and cut her leg. Bleeding was controlled with pressure; she has been doing daily wound care and applying Neosporin.  She did not go to ED because of potential cost of visit. She does have a fever today but also has some mild URI symptoms.  ROS: Noncontributory.  O:  Filed Vitals:   06/22/12 1307  BP: 132/94  Pulse: 125  Temp: 100 F (37.8 C)  Resp: 18   GEN: In NAD: WN,WD. HEENT: [place]/AT; EOMI w/ clear conj/scl. EACs normal. TMs with erythema and scant effusion. Post ph erythematous w/o exudate. NECK: No LAN. COR: RRR. Heart rate below 100 bpm. LUNGS: CTA w/ normal resp rate. SKIN: Posterior r calf- 1.5 cm scabbed lesion w/o evidence of infection. Slightly swollen w/ minimal tenderness. NEURO: A&O x 3; Cns intact. Nonfocal. Pt has normal affect,  Is conversant and attentive, displays normal behavior, thought content and judgment.  A/P; 1. Depression with anxiety  RX: Sertraline 50 mg  1/2 tab every AM x 2 weeks then increase dose to 1 tab every AM  2. Laceration of right lower leg  Cont daily  Care RX: Augmentin 875-125 mg  1 tab bid x 10 days.  3. Need for prophylactic vaccination with combined diphtheria-tetanus-pertussis (DTP) vaccine  Tdap vaccine greater than or equal to 7yo IM   Pt to communicate with clinic via MyChart e: effectiveness of medication; will follow-up sooner than 3 months as needed.

## 2012-06-22 NOTE — Patient Instructions (Signed)
Medication for Depression: Sertraline 50 mg  Take 1/2 tablet every morning through the end of November; starting Dec 1st, take 1 tablet every morning. You should be feeling better a month from now; you can communicate via the MyChart portal if you feel you need to come in sooner or the medication does not seem to be helping. Otherwise I will see you in 3 months.   Depression, Adult Depression refers to feeling sad, low, down in the dumps, blue, gloomy, or empty. In general, there are two kinds of depression: 1. Depression that we all experience from time to time because of upsetting life experiences, including the loss of a job or the ending of a relationship (normal sadness or normal grief). This kind of depression is considered normal, is short lived, and resolves within a few days to 2 weeks. (Depression experienced after the loss of a loved one is called bereavement. Bereavement often lasts longer than 2 weeks but normally gets better with time.) 2. Clinical depression, which lasts longer than normal sadness or normal grief or interferes with your ability to function at home, at work, and in school. It also interferes with your personal relationships. It affects almost every aspect of your life. Clinical depression is an illness. Symptoms of depression also can be caused by conditions other than normal sadness and grief or clinical depression. Examples of these conditions are listed as follows:  Physical illness Some physical illnesses, including underactive thyroid gland (hypothyroidism), severe anemia, specific types of cancer, diabetes, uncontrolled seizures, heart and lung problems, strokes, and chronic pain are commonly associated with symptoms of depression.  Side effects of some prescription medicine In some people, certain types of prescription medicine can cause symptoms of depression.  Substance abuse Abuse of alcohol and illicit drugs can cause symptoms of  depression. SYMPTOMS Symptoms of normal sadness and normal grief include the following:  Feeling sad or crying for short periods of time.  Not caring about anything (apathy).  Difficulty sleeping or sleeping too much.  No longer able to enjoy the things you used to enjoy.  Desire to be by oneself all the time (social isolation).  Lack of energy or motivation.  Difficulty concentrating or remembering.  Change in appetite or weight.  Restlessness or agitation. Symptoms of clinical depression include the same symptoms of normal sadness or normal grief and also the following symptoms:  Feeling sad or crying all the time.  Feelings of guilt or worthlessness.  Feelings of hopelessness or helplessness.  Thoughts of suicide or the desire to harm yourself (suicidal ideation).  Loss of touch with reality (psychotic symptoms). Seeing or hearing things that are not real (hallucinations) or having false beliefs about your life or the people around you (delusions and paranoia). DIAGNOSIS  The diagnosis of clinical depression usually is based on the severity and duration of the symptoms. Your caregiver also will ask you questions about your medical history and substance use to find out if physical illness, use of prescription medicine, or substance abuse is causing your depression. Your caregiver also may order blood tests. TREATMENT  Typically, normal sadness and normal grief do not require treatment. However, sometimes antidepressant medicine is prescribed for bereavement to ease the depressive symptoms until they resolve. The treatment for clinical depression depends on the severity of your symptoms but typically includes antidepressant medicine, counseling with a mental health professional, or a combination of both. Your caregiver will help to determine what treatment is best for you. Depression caused by physical  illness usually goes away with appropriate medical treatment of the illness.  If prescription medicine is causing depression, talk with your caregiver about stopping the medicine, decreasing the dose, or substituting another medicine. Depression caused by abuse of alcohol or illicit drugs abuse goes away with abstinence from these substances. Some adults need professional help in order to stop drinking or using drugs. SEEK IMMEDIATE CARE IF:  You have thoughts about hurting yourself or others.  You lose touch with reality (have psychotic symptoms).  You are taking medicine for depression and have a serious side effect. FOR MORE INFORMATION National Alliance on Mental Illness: www.nami.[employer]: http://www.maynard.net/ Document Released: 07/22/2000 Document Revised: 01/24/2012 Document Reviewed: 10/24/2011 [facility] Patient Information 2013 [place], [place].

## 2012-08-17 ENCOUNTER — Telehealth: Payer: Self-pay

## 2012-08-17 NOTE — Telephone Encounter (Signed)
Patient is out of xanax and would like rx filled. Pharmacy sent request Thurs and it is in the refill request pile at the nurse's station. Best [phone]

## 2012-08-19 NOTE — Telephone Encounter (Signed)
Pharmacy requesting refill on alprazolam 0.5mg . Last refill 07/03/12

## 2012-08-21 ENCOUNTER — Telehealth: Payer: Self-pay

## 2012-08-21 ENCOUNTER — Other Ambulatory Visit: Payer: Self-pay | Admitting: Family Medicine

## 2012-08-21 MED ORDER — ALPRAZOLAM 0.5 MG PO TABS: ORAL_TABLET | ORAL | Status: DC

## 2012-08-21 NOTE — Telephone Encounter (Signed)
Pt called in to check on status of RF. Operator stated that pt was upset that it had not been addressed yet. I advised operator to let pt know that all controlled substances need to be approved by the Rxing provider and that Dr Audria Nine had been out of the office d/t illness during the time when request came through and that she will be in this afternoon to review her messages/requests.

## 2012-08-21 NOTE — Telephone Encounter (Signed)
Spoke to patient regarding her message for refill on Xanax.  Advised patient that we would speak with Dr Audria Nine regarding her request and we will call her back this afternoon.  Patient verbalized understanding.

## 2012-08-21 NOTE — Telephone Encounter (Signed)
Telephone call to patient advising her Dr Audria Nine called in her Xanax RX to pharmacy.  Patient verbalized understanding.

## 2012-09-24 ENCOUNTER — Encounter (HOSPITAL_COMMUNITY): Payer: Self-pay | Admitting: Emergency Medicine

## 2012-09-24 ENCOUNTER — Emergency Department (HOSPITAL_COMMUNITY)
Admission: EM | Admit: 2012-09-24 | Discharge: 2012-09-24 | Disposition: A | Discharge status: Home / Self Care | Payer: Self-pay | Attending: Emergency Medicine | Admitting: Emergency Medicine

## 2012-09-24 DIAGNOSIS — F17200 Nicotine dependence, unspecified, uncomplicated: Secondary | ICD-10-CM | POA: Insufficient documentation

## 2012-09-24 DIAGNOSIS — R5381 Other malaise: Secondary | ICD-10-CM | POA: Insufficient documentation

## 2012-09-24 DIAGNOSIS — R112 Nausea with vomiting, unspecified: Secondary | ICD-10-CM | POA: Insufficient documentation

## 2012-09-24 DIAGNOSIS — Z79899 Other long term (current) drug therapy: Secondary | ICD-10-CM | POA: Insufficient documentation

## 2012-09-24 DIAGNOSIS — F329 Major depressive disorder, single episode, unspecified: Secondary | ICD-10-CM | POA: Insufficient documentation

## 2012-09-24 DIAGNOSIS — F3289 Other specified depressive episodes: Secondary | ICD-10-CM | POA: Insufficient documentation

## 2012-09-24 DIAGNOSIS — IMO0002 Reserved for concepts with insufficient information to code with codable children: Secondary | ICD-10-CM | POA: Insufficient documentation

## 2012-09-24 DIAGNOSIS — K921 Melena: Secondary | ICD-10-CM | POA: Insufficient documentation

## 2012-09-24 DIAGNOSIS — E119 Type 2 diabetes mellitus without complications: Secondary | ICD-10-CM | POA: Insufficient documentation

## 2012-09-24 DIAGNOSIS — K219 Gastro-esophageal reflux disease without esophagitis: Secondary | ICD-10-CM | POA: Insufficient documentation

## 2012-09-24 DIAGNOSIS — R197 Diarrhea, unspecified: Secondary | ICD-10-CM | POA: Insufficient documentation

## 2012-09-24 DIAGNOSIS — K625 Hemorrhage of anus and rectum: Secondary | ICD-10-CM | POA: Insufficient documentation

## 2012-09-24 DIAGNOSIS — R42 Dizziness and giddiness: Secondary | ICD-10-CM | POA: Insufficient documentation

## 2012-09-24 DIAGNOSIS — I10 Essential (primary) hypertension: Secondary | ICD-10-CM | POA: Insufficient documentation

## 2012-09-24 DIAGNOSIS — F411 Generalized anxiety disorder: Secondary | ICD-10-CM | POA: Insufficient documentation

## 2012-09-24 DIAGNOSIS — Z87828 Personal history of other (healed) physical injury and trauma: Secondary | ICD-10-CM | POA: Insufficient documentation

## 2012-09-24 DIAGNOSIS — Z87442 Personal history of urinary calculi: Secondary | ICD-10-CM | POA: Insufficient documentation

## 2012-09-24 HISTORY — DX: Depression, unspecified: F32.A

## 2012-09-24 HISTORY — DX: Essential (primary) hypertension: I10

## 2012-09-24 HISTORY — DX: Unspecified asthma, uncomplicated: J45.909

## 2012-09-24 HISTORY — DX: Major depressive disorder, single episode, unspecified: F32.9

## 2012-09-24 HISTORY — DX: Anxiety disorder, unspecified: F41.9

## 2012-09-24 LAB — COMPREHENSIVE METABOLIC PANEL
ALT: 22 U/L (ref 0–35)
AST: 19 U/L (ref 0–37)
Alkaline Phosphatase: 62 U/L (ref 39–117)
CO2: 20 mEq/L (ref 19–32)
Chloride: 104 mEq/L (ref 96–112)
Creatinine, Ser: 0.82 mg/dL (ref 0.50–1.10)
GFR calc non Af Amer: 89 mL/min — ABNORMAL LOW (ref >90)
Potassium: 3.4 mEq/L — ABNORMAL LOW (ref 3.5–5.1)
Sodium: 136 mEq/L (ref 135–145)
Total Bilirubin: 0.3 mg/dL (ref 0.3–1.2)

## 2012-09-24 LAB — URINALYSIS, ROUTINE W REFLEX MICROSCOPIC
Bilirubin Urine: NEGATIVE
Hgb urine dipstick: NEGATIVE
Nitrite: NEGATIVE
Specific Gravity, Urine: 1.017 (ref 1.005–1.030)
pH: 5 (ref 5.0–8.0)

## 2012-09-24 LAB — CBC
HCT: 40.3 % (ref 36.0–46.0)
Hemoglobin: 14.2 g/dL (ref 12.0–15.0)
MCH: 31.4 pg (ref 26.0–34.0)
MCHC: 35.2 g/dL (ref 30.0–36.0)
MCV: 89.2 fL (ref 78.0–100.0)
Platelets: 244 10*3/uL (ref 150–400)
RBC: 4.52 MIL/uL (ref 3.87–5.11)
RDW: 12.7 % (ref 11.5–15.5)
WBC: 12.8 10*3/uL — ABNORMAL HIGH (ref 4.0–10.5)

## 2012-09-24 LAB — TYPE AND SCREEN

## 2012-09-24 LAB — OCCULT BLOOD, POC DEVICE: Fecal Occult Bld: POSITIVE — AB

## 2012-09-24 LAB — LIPASE, BLOOD: Lipase: 35 U/L (ref 11–59)

## 2012-09-24 MED ORDER — SODIUM CHLORIDE 0.9 % IV BOLUS (SEPSIS)
1000.0000 mL | INTRAVENOUS | Status: AC
  Administered 2012-09-24: 1000 mL via INTRAVENOUS

## 2012-09-24 MED ORDER — PANTOPRAZOLE SODIUM 20 MG PO TBEC: 20.0000 mg | DELAYED_RELEASE_TABLET | Freq: Every day | ORAL | Status: DC

## 2012-09-24 MED ORDER — HYDROCODONE-ACETAMINOPHEN 5-325 MG PO TABS: 2.0000 | ORAL_TABLET | ORAL | Status: DC | PRN

## 2012-09-24 MED ORDER — ONDANSETRON HCL 4 MG PO TABS: 4.0000 mg | ORAL_TABLET | Freq: Three times a day (TID) | ORAL | Status: DC | PRN

## 2012-09-24 MED ORDER — HYDROMORPHONE HCL PF 1 MG/ML IJ SOLN
0.5000 mg | Freq: Once | INTRAMUSCULAR | Status: AC
  Administered 2012-09-24: 0.5 mg via INTRAVENOUS
  Filled 2012-09-24: qty 1

## 2012-09-24 MED ORDER — PANTOPRAZOLE SODIUM 40 MG IV SOLR
40.0000 mg | Freq: Once | INTRAVENOUS | Status: AC
  Administered 2012-09-24: 40 mg via INTRAVENOUS
  Filled 2012-09-24: qty 40

## 2012-09-24 NOTE — ED Provider Notes (Signed)
History     CSN: [id number]  Arrival date & time 09/24/12  8295   First MD Initiated Contact with Patient 09/24/12 1001      Chief Complaint  Patient presents with  . Rectal Bleeding    (Consider location/radiation/quality/duration/timing/severity/associated sxs/prior treatment) HPI Comments: This is a 42 year old female, who presents emergency department with chief complaint of rectal bleed. Patient states that she noticed the bleeding last night. She endorses associated nausea, vomiting, and diarrhea. She also states that she has felt dizzy and weak. Patient states that her vomit looked like coffee grounds. She tells me that she injured her back while shoveling snow several days ago, and that she's been taking a lot of ibuprofen since then. She states that her pain, which she describes as abdominal cramping, is 9/10. The pain does not radiate. She denies fever, chest pain, shortness of breath, numbness tingling of the extremities.  The history is provided by the patient. No language interpreter was used.    Past Medical History  Diagnosis Date  . Hypertension   . Asthma   . Anxiety   . Depression     Past Surgical History  Procedure Laterality Date  . Abdominal hysterectomy    . Ovarian cyst removal      No family history on file.  History  Substance Use Topics  . Smoking status: Current Some Day Smoker -- 12 years    Types: Cigarettes  . Smokeless tobacco: Not on file  . Alcohol Use: Yes     Comment: rarely    OB History   Grav Para Term Preterm Abortions TAB SAB Ect Mult Living                  Review of Systems  All other systems reviewed and are negative.    Allergies  Morphine  Home Medications   Current Outpatient Rx  Name  Route  Sig  Dispense  Refill  . ALPRAZolam (XANAX) 0.5 MG tablet   Oral   Take 0.25-0.5 mg by mouth 2 (two) times daily. Take 1/2 tab in AM and 1 tablet at bedtime.         . beclomethasone (QVAR) 80 MCG/ACT inhaler  Inhalation   Inhale 2 puffs into the lungs 2 (two) times daily.          . cetirizine (ZYRTEC) 10 MG tablet   Oral   Take 10 mg by mouth daily.         Marland Kitchen ibuprofen (ADVIL,MOTRIN) 200 MG tablet   Oral   Take 600 mg by mouth every 6 (six) hours as needed for pain.         Marland Kitchen lisinopril-hydrochlorothiazide (PRINZIDE,ZESTORETIC) 10-12.5 MG per tablet   Oral   Take 1 tablet by mouth daily.   30 tablet   5   . mometasone (NASONEX) 50 MCG/ACT nasal spray   Nasal   Place 2 sprays into the nose daily.         . mometasone-formoterol (DULERA) 200-5 MCG/ACT AERO   Inhalation   Inhale 2 puffs into the lungs 2 (two) times daily.         Marland Kitchen omeprazole (PRILOSEC) 20 MG capsule   Oral   Take 20 mg by mouth 2 (two) times daily.         . sertraline (ZOLOFT) 50 MG tablet   Oral   Take 1 tablet (50 mg total) by mouth daily.   30 tablet   5  BP 146/96  Pulse 100  Temp(Src) 98.5 F (36.9 C) (Oral)  Resp 20  SpO2 99%  Physical Exam  Nursing note and vitals reviewed. Constitutional: She is oriented to person, place, and time. She appears well-developed and well-nourished.  HENT:  Head: Normocephalic and atraumatic.  Eyes: Conjunctivae and EOM are normal. Pupils are equal, round, and reactive to light.  Neck: Normal range of motion. Neck supple.  Cardiovascular: Regular rhythm.  Exam reveals no gallop and no friction rub.   No murmur heard. Tachycardic  Pulmonary/Chest: Effort normal and breath sounds normal. No respiratory distress. She has no wheezes. She has no rales. She exhibits no tenderness.  Abdominal: Soft. Bowel sounds are normal. She exhibits no distension and no mass. There is no tenderness. There is no rebound and no guarding.  Genitourinary: Guaiac positive stool.  No hemorrhoids, lesions, or fissures noted on rectal exam, stool is soft and brown, and was not grossly bloody.  Musculoskeletal: Normal range of motion. She exhibits no edema and no tenderness.   Neurological: She is alert and oriented to person, place, and time.  Skin: Skin is warm and dry.  Psychiatric: She has a normal mood and affect. Her behavior is normal. Judgment and thought content normal.    ED Course  Procedures (including critical care time)  Labs Reviewed  OCCULT BLOOD, POC DEVICE - Abnormal; Notable for the following:    Fecal Occult Bld POSITIVE (*)    All other components within normal limits  CBC  COMPREHENSIVE METABOLIC PANEL  URINALYSIS, ROUTINE W REFLEX MICROSCOPIC  TYPE AND SCREEN   Results for orders placed during the hospital encounter of 09/24/12  URINALYSIS, ROUTINE W REFLEX MICROSCOPIC      Result Value Range   Color, Urine YELLOW  YELLOW   APPearance CLEAR  CLEAR   Specific Gravity, Urine 1.017  1.005 - 1.030   pH 5.0  5.0 - 8.0   Glucose, UA NEGATIVE  NEGATIVE mg/dL   Hgb urine dipstick NEGATIVE  NEGATIVE   Bilirubin Urine NEGATIVE  NEGATIVE   Ketones, ur NEGATIVE  NEGATIVE mg/dL   Protein, ur NEGATIVE  NEGATIVE mg/dL   Urobilinogen, UA 0.2  0.0 - 1.0 mg/dL   Nitrite NEGATIVE  NEGATIVE   Leukocytes, UA NEGATIVE  NEGATIVE  CBC      Result Value Range   WBC 12.8 (*) 4.0 - 10.5 K/uL   RBC 4.52  3.87 - 5.11 MIL/uL   Hemoglobin 14.2  12.0 - 15.0 g/dL   HCT 16.1  09.6 - 04.5 %   MCV 89.2  78.0 - 100.0 fL   MCH 31.4  26.0 - 34.0 pg   MCHC 35.2  30.0 - 36.0 g/dL   RDW 40.9  81.1 - 91.4 %   Platelets 244  150 - 400 K/uL  COMPREHENSIVE METABOLIC PANEL      Result Value Range   Sodium 136  135 - 145 mEq/L   Potassium 3.4 (*) 3.5 - 5.1 mEq/L   Chloride 104  96 - 112 mEq/L   CO2 20  19 - 32 mEq/L   Glucose, Bld 103 (*) 70 - 99 mg/dL   BUN 11  6 - 23 mg/dL   Creatinine, Ser 7.82  0.50 - 1.10 mg/dL   Calcium 8.5  8.4 - 95.6 mg/dL   Total Protein 6.5  6.0 - 8.3 g/dL   Albumin 3.4 (*) 3.5 - 5.2 g/dL   AST 19  0 - 37 U/L   ALT 22  0 - 35 U/L   Alkaline Phosphatase 62  39 - 117 U/L   Total Bilirubin 0.3  0.3 - 1.2 mg/dL   GFR calc non Af  Amer 89 (*) >90 mL/min   GFR calc Af Amer >90  >90 mL/min  LIPASE, BLOOD      Result Value Range   Lipase 35  11 - 59 U/L  OCCULT BLOOD, POC DEVICE      Result Value Range   Fecal Occult Bld POSITIVE (*) NEGATIVE  TYPE AND SCREEN      Result Value Range   ABO/RH(D) O POS     Antibody Screen NEG     Sample Expiration 09/27/2012    ABO/RH      Result Value Range   ABO/RH(D) O POS         1. Rectal bleeding   2. Nausea and vomiting       MDM  42 year old female with rectal bleeding. I suspect that the patient's bleeding could be from the increased ibuprofen use secondary to her back injury. Will obtain basic labs. There is no gross blood per rectum on rectal exam, however stool guaiac was positive. Have discussed patient with Dr. Lynelle Doctor, who advises me to give the patient protonix and to order a gastroccult.  Will treat the patient's pain and re-evaluate.  Dr. Lynelle Doctor has personally seen the patient, and tells me to consult GI regarding endoscopy.  1:04 PM Spoke with [facility].  They tell me that if the patient is hemodynamically stable, and has not had any gross blood in the emergency department, then she can be discharged and seen in the office tomorrow.  Discussed with Dr. Lynelle Doctor.  Will have patient follow-up with  GI tomorrow.  Patient is hemodynamically stable.  Return precautions have been given.        Roxy Horseman, PA-C 09/24/12 1930

## 2012-09-24 NOTE — ED Notes (Signed)
Pt presenting to ed with c/o rectal bleeding pt states she has positive nausea, vomiting and diarrhea. Pt states this morning she had coffee ground emesis. Pt states positive bright red rectal bleeding. Pt states the rectal bleeding looked just like she was having a period and she had a hysterectomy at 42 years of age. Pt states positive dizziness, weakness and it feels like I'm going to faint.

## 2012-09-24 NOTE — ED Provider Notes (Signed)
PT reports she has been having back pain from shoveling snow and taking ibuprofen for pain. She has GERD and is on omeptrazole. She was awakened this morning about 12:30 with diffuse cramping followed by BRB per rectum and then vomiting coffee grounds. States she saw blood with first episode of diarrhea and coffee grounds with first episode of vomiting. She denies melana. States she had EDG several years ago by the [facility] when she was diagnosed with GERD.  Pt has had pain medication and is comfortable at this time.   Medical screening examination/treatment/procedure(s) were conducted as a shared visit with non-physician practitioner(s) and myself.  I personally evaluated the patient during the encounter  Devoria Albe, MD, Franz Dell, MD 09/24/12 1259

## 2012-09-25 ENCOUNTER — Ambulatory Visit (INDEPENDENT_AMBULATORY_CARE_PROVIDER_SITE_OTHER)
Admission: RE | Admit: 2012-09-25 | Discharge: 2012-09-25 | Disposition: A | Discharge status: Home / Self Care | Payer: Self-pay | Source: Ambulatory Visit | Attending: Physician Assistant | Admitting: Physician Assistant

## 2012-09-25 ENCOUNTER — Encounter: Payer: Self-pay | Admitting: Physician Assistant

## 2012-09-25 ENCOUNTER — Ambulatory Visit (INDEPENDENT_AMBULATORY_CARE_PROVIDER_SITE_OTHER): Payer: Self-pay | Admitting: Physician Assistant

## 2012-09-25 VITALS — BP 122/88 | HR 120 | Ht 65.0 in | Wt 155.8 lb

## 2012-09-25 DIAGNOSIS — R1032 Left lower quadrant pain: Secondary | ICD-10-CM

## 2012-09-25 DIAGNOSIS — K625 Hemorrhage of anus and rectum: Secondary | ICD-10-CM

## 2012-09-25 MED ORDER — IOHEXOL 300 MG/ML  SOLN
100.0000 mL | Freq: Once | INTRAMUSCULAR | Status: AC | PRN
  Administered 2012-09-25: 100 mL via INTRAVENOUS

## 2012-09-25 NOTE — Progress Notes (Signed)
Subjective:    Patient ID: Laura Clay, female    DOB: 12-Jul-1971, 42 y.o.   MRN: [id number]  HPI Laura Clay is a pleasant 42 year old white female known to Dr. Leone Payor remotely. She had undergone endoscopy and colonoscopy in 2003. She was found to have reflux esophagitis and gastritis on EGD and colonoscopy was negative with the exception of hemorrhoids. She's not been seen here since. She comes in today as an urgent work in after an ER visit yesterday with acute abdominal pain. She said she had been doing well and had very abrupt onset of her current symptoms about 12:30 AM on Sunday night. She describes "horrible" pain across her lower abdomen followed by diaphoresis chills nausea vomiting and diarrhea with bright red blood. She says she had 1 or 2 episodes of emesis which looked "coffee-ground". She had several episodes of loose stools during that night which eventually became just passage of blood and mucus. She continued to have passage of primarily blood per rectum yesterday and no further vomiting.  Temp was documented at 99 6. She's not had any further vomiting and has been able to keep down clear liquids. She still having significant lower abdominal  pain today. She went to the emergency room yesterday and had labs done showing a WBC of 12.9 hemoglobin 14.2 hematocrit of 40.3 MCV of 89. No imaging was done. She has not had any prior history of GI bleeding. She's not been on any recent antibiotics. She has been taking some anti-inflammatories when necessary, but says not on a daily basis.    Review of Systems  Constitutional: Positive for diaphoresis and appetite change.  HENT: Negative.   Eyes: Negative.   Respiratory: Negative.   Cardiovascular: Negative.   Gastrointestinal: Positive for nausea, vomiting, abdominal pain and blood in stool.  Endocrine: Negative.   Genitourinary: Negative.   Allergic/Immunologic: Negative.   Neurological: Negative.   Hematological: Negative.    Psychiatric/Behavioral: Negative.    Outpatient Prescriptions Prior to Visit  Medication Sig Dispense Refill  . ALPRAZolam (XANAX) 0.5 MG tablet Take 0.25-0.5 mg by mouth 2 (two) times daily. Take 1/2 tab in AM and 1 tablet at bedtime.      . beclomethasone (QVAR) 80 MCG/ACT inhaler Inhale 2 puffs into the lungs 2 (two) times daily.       . cetirizine (ZYRTEC) 10 MG tablet Take 10 mg by mouth daily.      Marland Kitchen ibuprofen (ADVIL,MOTRIN) 200 MG tablet Take 600 mg by mouth every 6 (six) hours as needed for pain.      Marland Kitchen lisinopril-hydrochlorothiazide (PRINZIDE,ZESTORETIC) 10-12.5 MG per tablet Take 1 tablet by mouth daily.  30 tablet  5  . mometasone (NASONEX) 50 MCG/ACT nasal spray Place 2 sprays into the nose daily.      . mometasone-formoterol (DULERA) 200-5 MCG/ACT AERO Inhale 2 puffs into the lungs 2 (two) times daily.      Marland Kitchen omeprazole (PRILOSEC) 20 MG capsule Take 20 mg by mouth 2 (two) times daily.      . sertraline (ZOLOFT) 50 MG tablet Take 1 tablet (50 mg total) by mouth daily.  30 tablet  5  . HYDROcodone-acetaminophen (NORCO/VICODIN) 5-325 MG per tablet Take 2 tablets by mouth every 4 (four) hours as needed for pain.  6 tablet  0  . ondansetron (ZOFRAN) 4 MG tablet Take 1 tablet (4 mg total) by mouth every 8 (eight) hours as needed for nausea.  10 tablet  0  . pantoprazole (PROTONIX) 20 MG tablet  Take 1 tablet (20 mg total) by mouth daily.  30 tablet  0   No facility-administered medications prior to visit.   Allergies  Allergen Reactions  . Morphine     REACTION: per pt gives her hives   Patient Active Problem List  Diagnosis  . IRRITABLE BOWEL SYNDROME  . ABDOMINAL PAIN RIGHT LOWER QUADRANT  . ABDOMINAL TENDERNESS  . HTN (hypertension)  . Asthma  . Depression with anxiety   History  Substance Use Topics  . Smoking status: Current Some Day Smoker -- 12 years    Types: Cigarettes  . Smokeless tobacco: Not on file     Comment: form given 09/25/12  . Alcohol Use: No      Comment: rarely   family history includes Breast cancer in her maternal grandmother; Colon cancer in her maternal aunt and maternal uncle; Colon polyps in her cousin and maternal aunt; Diabetes in her father; Heart disease in her father; Kidney disease in her father; Liver disease in her maternal uncle; and Ovarian cancer in her maternal aunt.     Objective:   Physical Exam  white female in no acute distress uncomfortable-appearing blood pressure 122/88 pulse 110 height 5 foot 5 weight 155. HEENT; nontraumatic normocephalic EOMI PERRLA sclera anicteric,Neck; Supple no JVD, Cardiovascular; tachycardia regular rhythm with S1-S2 no murmur or gallop, Pulmonary; clear bilaterally, Abdomen; soft she is tender in the suprapubic area and left lower quadrant left mid quadrant no guarding or rebound no palpable mass or hepatosplenomegaly, Rectal; exam not done, Extremities; no clubbing cyanosis or edema skin warm and dry, Psych; mood and affect normal and appropriate.        Assessment & Plan:  #74  42 year old female with abrupt onset of intense lower abdominal  Pain, nausea, diaphoresis and bloody stools occurring about 36 hours ago . Her presentation is most consistent with a segmental ischemic colitis, also consider infectious etiology though she really has had more bleeding than diarrhea.  #2 chronic GERD-patient reported coffee-ground emesis x1-self-limited and hemoglobin 14.2 #3 IBS #4 hypertension  Plan; patient advised to follow a very bland diet and push fluids Home to rest over the next 48 hours She was given a prescription for Vicodin which she has not picked up yet and we'll do that today and encouraged to use regularly over the next couple of days until her pain improves. Zofran 4 mg every 6 hours as needed for nausea Continue omeprazole 20 mg by mouth and increase to twice daily over the next week. Schedule for CT scan of the abdomen and pelvis within the next 24 hours-further  recommendations pending CT

## 2012-09-25 NOTE — Patient Instructions (Addendum)
You have been given a separate informational sheet regarding your tobacco use, the importance of quitting and local resources to help you quit. Take the Hydrocodone as needed for pain. Take the Zofran as needed for nausea. You do not need to take the Protonix. Take the Omeprazole, 1 tab twice daily for the next 2 weeks.  Push Fluids and home to rest.   You have been scheduled for a CT scan of the abdomen and pelvis at [facility] ([address]---this is in the same building as [facility]).   You are scheduled today 09-25-2012 at 4:00 PM . You should arrive at 3:45 PM  to your appointment time for registration. Please follow the written instructions below on the day of your exam:  WARNING: IF YOU ARE ALLERGIC TO IODINE/X-RAY DYE, PLEASE NOTIFY RADIOLOGY IMMEDIATELY AT [phone]! YOU WILL BE GIVEN A 13 HOUR PREMEDICATION PREP.  1) Do not eat or drink anything after Noon  (4 hours prior to your test) 2) You have been given 2 bottles of oral contrast to drink. The solution may taste  better if refrigerated, but do NOT add ice or any other liquid to this solution. Shake well before drinking.    Drink 1 bottle of contrast @ 2:00 PM  (2 hours prior to your exam)  Drink 1 bottle of contrast @ 3:00 PM  (1 hour prior to your exam)  You may take any medications as prescribed with a small amount of water except for the following: Metformin, Glucophage, Glucovance, Avandamet, Riomet, Fortamet, Actoplus Met, Janumet, Glumetza or Metaglip. The above medications must be held the day of the exam AND 48 hours after the exam.  The purpose of you drinking the oral contrast is to aid in the visualization of your intestinal tract. The contrast solution may cause some diarrhea. Before your exam is started, you will be given a small amount of fluid to drink. Depending on your individual set of symptoms, you may also receive an intravenous injection of x-ray contrast/dye. Plan on being at  [facility] for 30 minutes or long, depending on the type of exam you are having performed.  If you have any questions regarding your exam or if you need to reschedule, you may call the CT department at [phone] between the hours of 8:00 am and 5:00 pm, Monday-Friday.  ________________________________________________________________________ ;

## 2012-09-26 NOTE — ED Provider Notes (Signed)
See prior note   Ward Givens, MD 09/26/12 1459

## 2012-09-26 NOTE — Progress Notes (Signed)
Agree with Ms. Esterwood's assessment and plan. Missey Hasley E. Marlee Armenteros, MD, FACG   

## 2012-09-28 ENCOUNTER — Other Ambulatory Visit: Payer: Self-pay | Admitting: *Deleted

## 2012-09-28 ENCOUNTER — Telehealth: Payer: Self-pay | Admitting: Physician Assistant

## 2012-09-28 MED ORDER — HYDROCODONE-ACETAMINOPHEN 5-325 MG PO TABS: ORAL_TABLET | ORAL | Status: DC

## 2012-09-28 NOTE — Telephone Encounter (Signed)
.  Rx sent to pharmacy. Patient states the bleeding has stopped but she is still having pain.

## 2012-09-28 NOTE — Telephone Encounter (Signed)
Ok to give her another Rx for Vicodin 5/325  # 40/0 refiils.. She should be feeling a little better-is she ?

## 2012-09-28 NOTE — Telephone Encounter (Signed)
Patient is out of Hydrocodone that she was given in ED. Asking for rx. Please, advise.

## 2012-10-03 ENCOUNTER — Ambulatory Visit (INDEPENDENT_AMBULATORY_CARE_PROVIDER_SITE_OTHER): Payer: Self-pay | Admitting: Physician Assistant

## 2012-10-03 ENCOUNTER — Encounter: Payer: Self-pay | Admitting: Physician Assistant

## 2012-10-03 VITALS — BP 124/92 | HR 100 | Ht 65.0 in | Wt 152.0 lb

## 2012-10-03 DIAGNOSIS — K55039 Acute (reversible) ischemia of large intestine, extent unspecified: Secondary | ICD-10-CM

## 2012-10-03 DIAGNOSIS — K55059 Acute (reversible) ischemia of intestine, part and extent unspecified: Secondary | ICD-10-CM

## 2012-10-03 MED ORDER — GLYCOPYRROLATE 2 MG PO TABS: ORAL_TABLET | ORAL | Status: DC

## 2012-10-03 NOTE — Progress Notes (Signed)
Subjective:    Patient ID: Laura Clay, female    DOB: 1970-10-08, 42 y.o.   MRN: [id number]  HPI [name] is a very nice 42 year old white female who was seen on 09/25/2012 with what was felt to be an episode of acute segmental ischemic colitis. She was having significant pain and was given Vicodin. CT of the abdomen and pelvis was ordered and this showed descending colonic wall thickening with surrounding edema/stranding compatible with an acute colitis infectious inflammatory or ischemic there is a small amount of free fluid in the pelvis the mesenteric vessels appeared patent. She comes back in today for followup, and states that she's about 60% better. She says she feels fine when she's resting but if she is up and moving she still having lower abdominal pain though not as severe. He has been using some Vicodin primarily at bedtime. She has not had any bleeding since Thursday the 20th and says after that he did not have any real bowel movement for a few days and then had some very dark heart her stools. Over the past couple of days she's been having 3-4 bowel movements per day no obvious blood. She is eating light but not having any problems with nausea, or  Vomiting. She has several questions today regarding the ischemic colitis and why this happened etc. Patient is on blood pressure medication and had also been taking over-the-counter "cold and flu" medication of the week that she became ill.    Review of Systems  Constitutional: Negative.   HENT: Negative.   Eyes: Negative.   Respiratory: Negative.   Cardiovascular: Negative.   Gastrointestinal: Positive for abdominal pain.  Endocrine: Negative.   Genitourinary: Negative.   Allergic/Immunologic: Negative.   Neurological: Negative.   Hematological: Negative.   Psychiatric/Behavioral: Negative.    Outpatient Prescriptions Prior to Visit  Medication Sig Dispense Refill  . ALPRAZolam (XANAX) 0.5 MG tablet Take 0.25-0.5 mg by mouth 2 (two)  times daily. Take 1/2 tab in AM and 1 tablet at bedtime.      . beclomethasone (QVAR) 80 MCG/ACT inhaler Inhale 2 puffs into the lungs 2 (two) times daily.       . cetirizine (ZYRTEC) 10 MG tablet Take 10 mg by mouth daily.      Marland Kitchen HYDROcodone-acetaminophen (NORCO/VICODIN) 5-325 MG per tablet Take one tablet every 4-6 hours prn pain  40 tablet  0  . lisinopril-hydrochlorothiazide (PRINZIDE,ZESTORETIC) 10-12.5 MG per tablet Take 1 tablet by mouth daily.  30 tablet  5  . mometasone (NASONEX) 50 MCG/ACT nasal spray Place 2 sprays into the nose daily.      . mometasone-formoterol (DULERA) 200-5 MCG/ACT AERO Inhale 2 puffs into the lungs 2 (two) times daily.      Marland Kitchen omeprazole (PRILOSEC) 20 MG capsule Take 20 mg by mouth 2 (two) times daily.      . sertraline (ZOLOFT) 50 MG tablet Take 1 tablet (50 mg total) by mouth daily.  30 tablet  5  . ibuprofen (ADVIL,MOTRIN) 200 MG tablet Take 600 mg by mouth every 6 (six) hours as needed for pain.      Marland Kitchen ondansetron (ZOFRAN) 4 MG tablet Take 1 tablet (4 mg total) by mouth every 8 (eight) hours as needed for nausea.  10 tablet  0   No facility-administered medications prior to visit.   Allergies  Allergen Reactions  . Morphine     REACTION: per pt gives her hives   Patient Active Problem List  Diagnosis  .  IRRITABLE BOWEL SYNDROME  . ABDOMINAL PAIN RIGHT LOWER QUADRANT  . ABDOMINAL TENDERNESS  . HTN (hypertension)  . Asthma  . Depression with anxiety   History  Substance Use Topics  . Smoking status: Current Some Day Smoker -- 12 years    Types: Cigarettes  . Smokeless tobacco: Never Used     Comment: form given 09/25/12  . Alcohol Use: No     Comment: rarely   family history includes Breast cancer in her maternal grandmother; Colon cancer in her maternal aunt; Colon polyps in her cousin and maternal aunt; Diabetes in her father; Heart disease in her father; Kidney cancer in her maternal uncle; and Kidney disease in her father.    Objective:    Physical Exam Well-developed white female in no acute distress, pleasant accompanied by her mother blood pressure 124/92 pulse 100 height 5 foot 5 weight 152 eerie HEENT; nontraumatic normocephalic EOMI PERRLA sclera anicteric,Neck; Supple no JVD, Cardiovascular; regular rate and rhythm with S1-S2 no murmur or gallop, Pulmonary; clear bilaterally, Abdomen; soft, nondistended, bowel sounds are active she has mild tenderness in the left lower quadrant, suprapubic area and right lower quadrant no guarding, no rebound ,no palpable mass or hepatosplenomegaly, Rectal;  brown Hemoccult negative stool, Extremities; no clubbing cyanosis or edema skin warm dry, Psych ;mood and affect normal and appropriate.       Assessment & Plan:  #104 42 year old female with resolving acute segmental ischemic colitis. #2 IBS #3 positive family history of colon cancer in a second-degree  Relative-patient had a negative colonoscopy 2003  Plan; long discussion with patient and her mother regarding the nature of segmental ischemic colitis, and reassured that this is generally an isolated event. Will try Robinul Forte 2 mg by mouth once or twice daily as needed for residual abdominal discomfort and cramping. Patient reassured and have asked her to call back in 2 weeks if she is still having any persistent symptoms and at that time will consider colonoscopy.

## 2012-10-03 NOTE — Patient Instructions (Addendum)
We sent a prescription to [facility] [address]   Call us back in 2 weeks to schedule a colonoscopy if symptoms have not completely resolved.

## 2012-10-05 NOTE — Progress Notes (Signed)
Agree with Ms. Esterwood's assessment and plan. Carl E. Gessner, MD, FACG   

## 2012-11-06 ENCOUNTER — Other Ambulatory Visit: Payer: Self-pay | Admitting: Family Medicine

## 2013-01-07 ENCOUNTER — Other Ambulatory Visit: Payer: Self-pay | Admitting: Family Medicine

## 2013-02-27 ENCOUNTER — Other Ambulatory Visit: Payer: Self-pay | Admitting: Family Medicine

## 2013-10-16 ENCOUNTER — Encounter: Payer: Self-pay | Admitting: Family Medicine

## 2013-10-16 ENCOUNTER — Ambulatory Visit (INDEPENDENT_AMBULATORY_CARE_PROVIDER_SITE_OTHER): Payer: BC Managed Care – PPO | Admitting: Family Medicine

## 2013-10-16 VITALS — BP 150/120 | HR 107 | Temp 98.2°F | Resp 16 | Ht 65.0 in | Wt 159.0 lb

## 2013-10-16 DIAGNOSIS — I10 Essential (primary) hypertension: Secondary | ICD-10-CM

## 2013-10-16 DIAGNOSIS — Z13220 Encounter for screening for lipoid disorders: Secondary | ICD-10-CM

## 2013-10-16 DIAGNOSIS — J019 Acute sinusitis, unspecified: Secondary | ICD-10-CM

## 2013-10-16 LAB — LDL CHOLESTEROL, DIRECT: LDL DIRECT: 134 mg/dL — AB

## 2013-10-16 LAB — COMPLETE METABOLIC PANEL WITH GFR
ALT: 24 U/L (ref 0–35)
AST: 18 U/L (ref 0–37)
Albumin: 4.1 g/dL (ref 3.5–5.2)
Alkaline Phosphatase: 77 U/L (ref 39–117)
BILIRUBIN TOTAL: 0.3 mg/dL (ref 0.2–1.2)
BUN: 12 mg/dL (ref 6–23)
CALCIUM: 9.7 mg/dL (ref 8.4–10.5)
CHLORIDE: 102 meq/L (ref 96–112)
CO2: 23 meq/L (ref 19–32)
CREATININE: 0.77 mg/dL (ref 0.50–1.10)
GLUCOSE: 72 mg/dL (ref 70–99)
Potassium: 4.5 mEq/L (ref 3.5–5.3)
Sodium: 137 mEq/L (ref 135–145)
Total Protein: 7.1 g/dL (ref 6.0–8.3)

## 2013-10-16 MED ORDER — BLOOD PRESSURE MONITOR/WRIST DEVI: 1.0000 | Freq: Every day | Status: DC

## 2013-10-16 MED ORDER — LISINOPRIL-HYDROCHLOROTHIAZIDE 10-12.5 MG PO TABS: 1.0000 | ORAL_TABLET | Freq: Every day | ORAL | Status: DC

## 2013-10-16 MED ORDER — AZITHROMYCIN 250 MG PO TABS: ORAL_TABLET | ORAL | Status: DC

## 2013-10-16 NOTE — Patient Instructions (Signed)
I want you to return for NURSE VISIT only in 1 month to get your blood pressure checked.  I also want youto check your blood pressure at home.   Hypertension As your heart beats, it forces blood through your arteries. This force is your blood pressure. If the pressure is too high, it is called hypertension (HTN) or high blood pressure. HTN is dangerous because you may have it and not know it. High blood pressure may mean that your heart has to work harder to pump blood. Your arteries may be narrow or stiff. The extra work puts you at risk for heart disease, stroke, and other problems.  Blood pressure consists of two numbers, a higher number over a lower, 110/72, for example. It is stated as "110 over 72." The ideal is below 120 for the top number (systolic) and under 80 for the bottom (diastolic). Write down your blood pressure today. You should pay close attention to your blood pressure if you have certain conditions such as:  Heart failure.  Prior heart attack.  Diabetes  Chronic kidney disease.  Prior stroke.  Multiple risk factors for heart disease. To see if you have HTN, your blood pressure should be measured while you are seated with your arm held at the level of the heart. It should be measured at least twice. A one-time elevated blood pressure reading (especially in the Emergency Department) does not mean that you need treatment. There may be conditions in which the blood pressure is different between your right and left arms. It is important to see your caregiver soon for a recheck. Most people have essential hypertension which means that there is not a specific cause. This type of high blood pressure may be lowered by changing lifestyle factors such as:  Stress.  Smoking.  Lack of exercise.  Excessive weight.  Drug/tobacco/alcohol use.  Eating less salt. Most people do not have symptoms from high blood pressure until it has caused damage to the body. Effective treatment  can often prevent, delay or reduce that damage. TREATMENT  When a cause has been identified, treatment for high blood pressure is directed at the cause. There are a large number of medications to treat HTN. These fall into several categories, and your caregiver will help you select the medicines that are best for you. Medications may have side effects. You should review side effects with your caregiver. If your blood pressure stays high after you have made lifestyle changes or started on medicines,   Your medication(s) may need to be changed.  Other problems may need to be addressed.  Be certain you understand your prescriptions, and know how and when to take your medicine.  Be sure to follow up with your caregiver within the time frame advised (usually within two weeks) to have your blood pressure rechecked and to review your medications.  If you are taking more than one medicine to lower your blood pressure, make sure you know how and at what times they should be taken. Taking two medicines at the same time can result in blood pressure that is too low. SEEK IMMEDIATE MEDICAL CARE IF:  You develop a severe headache, blurred or changing vision, or confusion.  You have unusual weakness or numbness, or a faint feeling.  You have severe chest or abdominal pain, vomiting, or breathing problems. MAKE SURE YOU:   Understand these instructions.  Will watch your condition.  Will get help right away if you are not doing well or get worse.  Document Released: 07/25/2005 Document Revised: 10/17/2011 Document Reviewed: 03/14/2008 [facility] Patient Information 2014 [place], [place].    Potassium Content of Foods Potassium is a mineral found in many foods and drinks. It helps keep fluids and minerals balanced in your body and also affects how steadily your heart beats. The body needs potassium to control blood pressure and to keep the muscles and nervous system healthy. However, certain health  conditions and medicine may require you to eat more or less potassium-rich foods and drinks. Your caregiver or dietitian will tell you how much potassium you should have each day. COMMON SERVING SIZES The list below tells you how big or small common portion sizes are:  1 oz.........4 stacked dice.  3 oz........Marland Kitchen.Deck of cards.  1 tsp.......Marland Kitchen.Tip of little finger.  1 tbsp....Marland Kitchen.Marland Kitchen.Thumb.  2 tbsp....Marland Kitchen.Marland Kitchen.Golf ball.   c..........Marland Kitchen.Half of a fist.  1 c...........Marland Kitchen.A fist. FOODS AND DRINKS HIGH IN POTASSIUM More than 200 mg of potassium per serving. A serving size is  c (120 mL or noted gram weight) unless otherwise stated. While all the items on this list are high in potassium, some items are higher in potassium than others. Fruits  Apricots (sliced), 83 g.  Apricots (dried halves), 3 oz / 24 g.  Avocado (cubed),  c / 50 g.  Banana (sliced), 75 g.  Cantaloupe (cubed), 80 g.  Dates (pitted), 5 whole / 35 g.  Figs (dried), 4 whole / 32 g.  Guava, c / 55 g.  Honeydew, 1 wedge / 85 g.  Kiwi (sliced), 90 g.  Nectarine, 1 small / 129 g.  Orange, 1 medium / 131 g.  Orange juice.  Pomegranate seeds, 87 g.  Pomegranate juice.  Prunes (pitted), 3 whole / 30 g.  Prune juice, 3 oz / 90 mL.  Seedless raisins, 3 tbsp / 27 g. Vegetables  Artichoke,  of a medium / 64 g.  Asparagus (boiled), 90 g.  Baked beans,  c / 63 g.  Bamboo shoots,  c / 38 g.  Beets (cooked slices), 85 g.  Broccoli (boiled), 78 g.  Brussels sprout (boiled), 78 g.  Butternut squash (baked), 103 g.  Chickpea (cooked), 82 g.  Green peas (cooked), 80 g.  Hubbard squash (baked cubes),  c / 68 g.  Kidney beans (cooked), 5 tbsp / 55 g.  Lima beans (cooked),  c / 43 g.  Navy beans (cooked),  c / 61 g.  Potato (baked), 61 g.  Potato (boiled), 78 g.  Pumpkin (boiled), 123 g.  Refried beans,  c / 79 g.  Spinach (cooked),  c / 45 g.  Split peas (cooked),  c / 65 g.  Sun-dried  tomatoes, 2 tbsp / 7 g.  Sweet potato (baked),  c / 50 g.  Tomato (chopped or sliced), 90 g.  Tomato juice.  Tomato paste, 4 tsp / 21 g.  Tomato sauce,  c / 61 g.  Vegetable juice.  White mushrooms (cooked), 78 g.  Yam (cooked or baked),  c / 34 g.  Zucchini squash (boiled), 90 g. Other Foods and Drinks  Almonds (whole),  c / 36 g.  Cashews (oil roasted),  c / 32 g.  Chocolate milk.  Chocolate pudding, 142 g.  Clams (steamed), 1.5 oz / 43 g.  Dark chocolate, 1.5 oz / 42 g.  Fish, 3 oz / 85 g.  King crab (steamed), 3 oz / 85 g.  Lobster (steamed), 4 oz / 113 g.  Milk (skim, 1%, 2%, whole),  1 c / 240 mL.  Milk chocolate, 2.3 oz / 66 g.  Milk shake.  Nonfat fruit variety yogurt, 123 g.  Peanuts (oil roasted), 1 oz / 28 g.  Peanut butter, 2 tbsp / 32 g.  Pistachio nuts, 1 oz / 28 g.  Pumpkin seeds, 1 oz / 28 g.  Red meat (broiled, cooked, grilled), 3 oz / 85 g.  Scallops (steamed), 3 oz / 85 g.  Shredded wheat cereal (dry), 3 oblong biscuits / 75 g.  Spaghetti sauce,  c / 66 g.  Sunflower seeds (dry roasted), 1 oz / 28 g.  Veggie burger, 1 patty / 70 g. FOODS MODERATE IN POTASSIUM Between 150 mg and 200 mg per serving. A serving is  c (120 mL or noted gram weight) unless otherwise stated. Fruits  Grapefruit,  of the fruit / 123 g.  Grapefruit juice.  Pineapple juice.  Plums (sliced), 83 g.  Tangerine, 1 large / 120 g. Vegetables  Carrots (boiled), 78 g.  Carrots (sliced), 61 g.  Rhubarb (cooked with sugar), 120 g.  R[place]aga (cooked), 120 g.  Sweet corn (cooked), 75 g.  Yellow snap beans (cooked), 63 g. Other Foods and Drinks   Bagel, 1 bagel / 98 g.  Chicken breast (roasted and chopped),  c / 70 g.  Chocolate ice cream / 66 g.  Pita bread, 1 large / 64 g.  Shrimp (steamed), 4 oz / 113 g.  Swiss cheese (diced), 70 g.  Vanilla ice cream, 66 g.  Vanilla pudding, 140 g. FOODS LOW IN POTASSIUM Less than 150  mg per serving. A serving size is  cup (120 mL or noted gram weight) unless otherwise stated. If you eat more than 1 serving of a food low in potassium, the food may be considered a food high in potassium. Fruits  Apple (slices), 55 g.  Apple juice.  Applesauce, 122 g.  Blackberries, 72 g.  Blueberries, 74 g.  Cranberries, 50 g.  Cranberry juice.  Fruit cocktail, 119 g.  Fruit punch.  Grapes, 46 g.  Grape juice.  Mandarin oranges (canned), 126 g.  Peach (slices), 77 g.  Pineapple (chunks), 83 g.  Raspberries, 62 g.  Red cherries (without pits), 78 g.  Strawberries (sliced), 83 g.  Watermelon (diced), 76 g. Vegetables  Alfalfa sprouts, 17 g.  Bell peppers (sliced), 46 g.  Cabbage (shredded), 35 g.  Cauliflower (boiled), 62 g.  Celery, 51 g.  Collard greens (boiled), 95 g.  Cucumber (sliced), 52 g.  Eggplant (cubed), 41 g.  Green beans (boiled), 63 g.  Lettuce (shredded), 1 c / 36 g.  Onions (sauteed), 44 g.  Radishes (sliced), 58 g.  Spaghetti squash, 51 g. Other Foods and Drinks  [employer], 1 slice / 28 g.  Black tea.  Brown rice (cooked), 98 g.  Butter croissant, 1 medium / 57 g.  Carbonated soda.  Coffee.  Cheddar cheese (diced), 66 g.  Corn flake cereal (dry), 14 g.  Cottage cheese, 118 g.  Cream of rice cereal (cooked), 122 g.  Cream of wheat cereal (cooked), 126 g.  Crisped rice cereal (dry), 14 g.  Egg (boiled, fried, poached, omelet, scrambled), 1 large / 46 61 g.  English muffin, 1 muffin / 57 g.  Frozen ice pop, 1 pop / 55 g.  Graham cracker, 1 large rectangular cracker / 14 g.  Jelly beans, 112 g.  Non-dairy whipped topping.  Oatmeal, 88 g.  Orange sherbet, 74 g.  Puffed rice cereal (dry), 7 g.  Pasta (cooked), 70 g.  Rice cakes, 4 cakes / 36 g.  Sugared doughnut, 4 oz / 116 g.  White bread, 1 slice / 30 g.  White rice (cooked), 79 93 g.  Wild rice (cooked), 82 g.  Yellow cake, 1  slice / 68 g. Document Released: 03/08/2005 Document Revised: 07/11/2012 Document Reviewed: 12/09/2011 [facility] Patient Information 2014 [place], [place].

## 2013-10-17 ENCOUNTER — Encounter: Payer: Self-pay | Admitting: Family Medicine

## 2013-10-17 LAB — THYROID PANEL
Free T4: 1.02 ng/dL (ref 0.80–1.80)
T3 UPTAKE: 34 % (ref 22.5–37.0)
T4, Total: 8.6 ug/dL (ref 5.0–12.5)
TSH: 0.801 u[IU]/mL (ref 0.350–4.500)

## 2013-10-17 NOTE — Progress Notes (Signed)
S:  This 43 y.o. Cauc female is here for HTN follow-up; medications compliance is good but she has missed a few doses and she is not adhering to a healthy diet. She attributes elevated BP to job-related stress. She denies fatigue, vision disturbances, CP or tightness, palpitations, SOB or DOE, cough, edema, HA, dizziness, numbness or weakness, tremor or syncope.  Pt c/o sinus pain and congestion, mild sore throat, discolored nasal discharge and NP cough. No exposure to illness except perhaps at work. Mild low grade fever but no chills, n/v/d or rash. Pt has Asthma and uses MDIs as prescribed.   Hx of lipid disorder but pt not on medication; family hx of heart disease.  Patient Active Problem List   Diagnosis Date Noted  . Depression with anxiety 06/22/2012  . HTN (hypertension) 05/08/2012  . Asthma 05/08/2012  . IRRITABLE BOWEL SYNDROME 09/09/2009  . ABDOMINAL PAIN RIGHT LOWER QUADRANT 09/09/2009  . ABDOMINAL TENDERNESS 09/09/2009   PMHx, Surg Hx, SOc and Fam Hx reviewed.  MEDICATIONS reconciled.  ROS: AS per HPI.  O: Filed Vitals:   10/16/13 1004  BP: 150/120  Pulse: 107  Temp: 98.2 F (36.8 C)  Resp: 16   GEN: In NAD; WN,WD. HENT: [employer]/AT; EOMI w/ clear conj/sclerae. EAC/TMs normal. Nasal mucosa erythematous and boggy. Post ph erythematous w/o lesions or exudate. Mildly tender maxillary sinuses. NECK: Supple w/o LAN or TMG. COR: RRR. Normal S1 and S2. No m/g/r. LUNGS: CTA. No wheezes or rhonchi. SKIN: W&D; intact w/o erythema, rash or pallor. NEURO: A&O x 3; Cns intact. Nonfocal.  A/P: HTN (hypertension) - Continue current medication; improve nutrition and stress reduction.   Plan: LDL Cholesterol, Direct, COMPLETE METABOLIC PANEL WITH GFR, Thyroid Profile  Sinusitis, acute- Z-PAK x 1. Use OTC saline nasal mist (AYR brand).  Screening for hyperlipidemia - Plan: LDL Cholesterol, Direct, COMPLETE METABOLIC PANEL WITH GFR  Meds ordered this encounter  Medications  .  lisinopril-hydrochlorothiazide (PRINZIDE,ZESTORETIC) 10-12.5 MG per tablet    Sig: Take 1 tablet by mouth daily.    Dispense:  30 tablet    Refill:  5  . azithromycin (ZITHROMAX) 250 MG tablet    Sig: Take 2 tabs as 1st dose then 1 tab daily for 4 days.    Dispense:  6 tablet    Refill:  0  . Blood Pressure Monitoring (BLOOD PRESSURE MONITOR/WRIST) DEVI    Sig: 1 Device by Does not apply route daily.    Dispense:  1 Device    Refill:  0

## 2013-10-18 NOTE — Progress Notes (Signed)
Quick Note:  Please advise pt regarding following labs... All labs are normal (thyroid function and metabolic panel). LDL ("bad") cholesterol is above normal; next time we check this, we will make sure you have fasted for 10-12 hours. Work on better nutrition and regular physical activity.  The Mediterranean Diet is a good guide for "Heart Healthy" eating. Omega-3 Fish Oil or Krill Oil capsule daily will help improve your lipid profile (lower the LDL cholesterol).  Copy to pt. ______

## 2013-11-17 ENCOUNTER — Ambulatory Visit: Payer: BC Managed Care – PPO

## 2013-11-17 ENCOUNTER — Ambulatory Visit (INDEPENDENT_AMBULATORY_CARE_PROVIDER_SITE_OTHER): Payer: BC Managed Care – PPO | Admitting: Family Medicine

## 2013-11-17 VITALS — BP 118/86 | HR 118 | Temp 98.3°F | Resp 18 | Ht 64.0 in | Wt 153.6 lb

## 2013-11-17 DIAGNOSIS — M25579 Pain in unspecified ankle and joints of unspecified foot: Secondary | ICD-10-CM

## 2013-11-17 DIAGNOSIS — S90819A Abrasion, unspecified foot, initial encounter: Secondary | ICD-10-CM

## 2013-11-17 DIAGNOSIS — M79609 Pain in unspecified limb: Secondary | ICD-10-CM

## 2013-11-17 MED ORDER — DICLOFENAC SODIUM 75 MG PO TBEC: 75.0000 mg | DELAYED_RELEASE_TABLET | Freq: Two times a day (BID) | ORAL | Status: DC

## 2013-11-17 NOTE — Progress Notes (Signed)
This chart was scribed for Elvina SidleKurt Lauenstein, MD, by Yevette EdwardsAngela Bracken, ED Scribe. This patient's care was started at 1:23 PM.   ID: Laura DropAnna Clay, female   DOB: Dec 24, 1970, 43 y.o.   MRN: [id number]      Patient Name: Laura Clay Date of Birth: Dec 24, 1970 Medical Record Number: [id number] Gender: female Date of Encounter: 11/17/2013  Chief Complaint: Foot Injury   History of Present Illness:  Laura Dropnna Clay is a 43 y.o. very pleasant female patient who presents with the following: right foot injury which occurred two days ago when she slipped down steps. She reports increased swelling and redness to the dorsum of her foot as well. She also reports pain with ambulation. Ms. Laura MayhewLeach has elevated and iced the foot as well used IBU without resolution.  Her husband passed away 1 and a half years ago.   The pt works in [employer]; she does employee benefits.   Patient Active Problem List   Diagnosis Date Noted   Depression with anxiety 06/22/2012   HTN (hypertension) 05/08/2012   Asthma 05/08/2012   IRRITABLE BOWEL SYNDROME 09/09/2009   ABDOMINAL PAIN RIGHT LOWER QUADRANT 09/09/2009   ABDOMINAL TENDERNESS 09/09/2009   Past Medical History  Diagnosis Date   Hypertension    Asthma    Anxiety    Depression    GERD (gastroesophageal reflux disease)    Kidney stones    Past Surgical History  Procedure Laterality Date   Ovarian cyst removal     Bunionectomy Left    Abdominal hysterectomy  2000    Abnormal PAPs   History  Substance Use Topics   Smoking status: Former Smoker -- 12 years    Types: Cigarettes    Quit date: 03/19/2013   Smokeless tobacco: Never Used     Comment: form given 09/25/12   Alcohol Use: No     Comment: rarely   Family History  Problem Relation Age of Onset   Breast cancer Maternal Grandmother    Colon cancer Maternal Aunt    Colon polyps Cousin    Colon polyps Maternal Aunt    Diabetes Father    Heart disease Father    Kidney disease  Father    Kidney cancer Maternal Uncle    Allergies  Allergen Reactions   Morphine     REACTION: per pt gives her hives    Medication list has been reviewed and updated.  Current Outpatient Prescriptions on File Prior to Visit  Medication Sig Dispense Refill   beclomethasone (QVAR) 80 MCG/ACT inhaler Inhale 2 puffs into the lungs 2 (two) times daily.        Blood Pressure Monitoring (BLOOD PRESSURE MONITOR/WRIST) DEVI 1 Device by Does not apply route daily.  1 Device  0   cetirizine (ZYRTEC) 10 MG tablet Take 10 mg by mouth daily.       glycopyrrolate (ROBINUL) 2 MG tablet Take 1 tab twice daily as needed for cramping and spasms.  30 tablet  1   lisinopril-hydrochlorothiazide (PRINZIDE,ZESTORETIC) 10-12.5 MG per tablet Take 1 tablet by mouth daily.  30 tablet  5   mometasone (NASONEX) 50 MCG/ACT nasal spray Place 2 sprays into the nose daily.       mometasone-formoterol (DULERA) 200-5 MCG/ACT AERO Inhale 2 puffs into the lungs 2 (two) times daily.       omeprazole (PRILOSEC) 20 MG capsule Take 20 mg by mouth 2 (two) times daily.       azithromycin (ZITHROMAX) 250 MG tablet Take 2 tabs as  1st dose then 1 tab daily for 4 days.  6 tablet  0   No current facility-administered medications on file prior to visit.    Review of Systems:  Positive: right foot pain, swelling, redness. Difficulty ambulating secondary to pain.   Physical Examination: Filed Vitals:   11/17/13 1319  BP: 118/86  Pulse: 118  Temp: 98.3 F (36.8 C)  Resp: 18   @vitals2 @ Body mass index is 26.35 kg/(m^2). Ideal Body Weight: @FLOWAMB ([id number])@  Pt has a 1-2 cm area of abrasion to dorsal proximal foot. She has surroudning swelling and tenderness. She has ecchymosis paralle to right 5th metatarsal which is not tender.   EKG / Labs / Xrays: [facility] reading (PRIMARY) by  Dr. Milus Glazier:  Negative right foot.    Assessment and Plan: Pain in joint, ankle and foot - Plan: DG Foot Complete Right,  diclofenac (VOLTAREN) 75 MG EC tablet  Abrasion, foot - Plan: DG Foot Complete Right, diclofenac (VOLTAREN) 75 MG EC tablet  Signed, Elvina Sidle, MD    Yevette Edwards, ED scribe.

## 2013-11-20 ENCOUNTER — Ambulatory Visit: Payer: BC Managed Care – PPO | Admitting: Family Medicine

## 2014-01-22 ENCOUNTER — Ambulatory Visit: Payer: BC Managed Care – PPO | Admitting: Family Medicine

## 2015-05-15 ENCOUNTER — Telehealth: Payer: Self-pay

## 2015-05-15 MED ORDER — MOMETASONE FURO-FORMOTEROL FUM 200-5 MCG/ACT IN AERO: 2.0000 | INHALATION_SPRAY | Freq: Two times a day (BID) | RESPIRATORY_TRACT | Status: DC

## 2015-05-15 MED ORDER — ALBUTEROL SULFATE HFA 108 (90 BASE) MCG/ACT IN AERS: 2.0000 | INHALATION_SPRAY | Freq: Four times a day (QID) | RESPIRATORY_TRACT | Status: DC | PRN

## 2015-05-15 NOTE — Telephone Encounter (Signed)
Spoke with patient would like to schedule appt with Dr Lucie Leather given appt for 06/03/15 @ 1045. Will leave pt a Dulera 200 sample up front and call in Proair HFA to [facility] states she is out and has not had one for 2 months.

## 2015-05-15 NOTE — Telephone Encounter (Signed)
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 Lucie Leather .  Please Advise

## 2015-05-15 NOTE — Telephone Encounter (Signed)
Patients correct phone number [phone]

## 2015-06-03 ENCOUNTER — Encounter: Payer: Self-pay | Admitting: Allergy and Immunology

## 2015-06-03 ENCOUNTER — Ambulatory Visit (INDEPENDENT_AMBULATORY_CARE_PROVIDER_SITE_OTHER): Payer: Self-pay | Admitting: Allergy and Immunology

## 2015-06-03 VITALS — BP 130/70 | HR 88 | Temp 99.0°F | Resp 17

## 2015-06-03 DIAGNOSIS — J3089 Other allergic rhinitis: Secondary | ICD-10-CM

## 2015-06-03 DIAGNOSIS — K219 Gastro-esophageal reflux disease without esophagitis: Secondary | ICD-10-CM

## 2015-06-03 DIAGNOSIS — J4540 Moderate persistent asthma, uncomplicated: Secondary | ICD-10-CM

## 2015-06-03 MED ORDER — ALBUTEROL SULFATE 108 (90 BASE) MCG/ACT IN AEPB: 2.0000 | INHALATION_SPRAY | RESPIRATORY_TRACT | Status: DC | PRN

## 2015-06-03 MED ORDER — METHYLPREDNISOLONE ACETATE 80 MG/ML IJ SUSP
80.0000 mg | Freq: Once | INTRAMUSCULAR | Status: AC
  Administered 2015-06-03: 80 mg via INTRAMUSCULAR

## 2015-06-03 NOTE — Patient Instructions (Signed)
  1. Depomedrol 80 IM now  2. Continue Dulera 200 two inhalations two times per day - samples  3. Continue Nasonex or OTC Rhinocort (Coupon) one spray each nostril 1 time per day   4. Continue omeprazole 20 one tablet one time per day  5. Use ProAir Respiclick (Coupon) 2 inhalations ebery 2-4 hours or albuterol nebulization every 4-6 hours if needed.  6. Return in 6 months or earlier if problem

## 2015-06-03 NOTE — Progress Notes (Signed)
[facility] [place]  Follow-up Note  Refering Provider: Elvina SidleLauenstein, Kurt, MD Primary Provider: Elvina SidleLAUENSTEIN,KURT, MD  Subjective:   Laura Clay is a 44 y.o. female who [facility] in re-evaluation of the following:  HPI Comments:  Laura Clay returns to this clinic noting that she's had increased coughing and wheezing along with nasal congestion and stuffiness for the past month. She's been using her bronchodilator daily. This is occurred while using her Dulera. This is her first major flareups and she's been seen in his clinic in February. She has not required any systemic steroids since that last visit. She might be slowly improving regarding this flareup. She has not had any associated fever or ugly nasal discharge or ugly sputum production. Her reflux is been under good control. There was no obvious trigger for this for flareup. She refuses to get the flu vaccine for this year.   Outpatient Prescriptions Prior to Visit  Medication Sig  . albuterol (PROVENTIL HFA;VENTOLIN HFA) 108 (90 BASE) MCG/ACT inhaler Inhale 2 puffs into the lungs every 6 (six) hours as needed for wheezing or shortness of breath.  . Blood Pressure Monitoring (BLOOD PRESSURE MONITOR/WRIST) DEVI 1 Device by Does not apply route daily.  . mometasone-formoterol (DULERA) 200-5 MCG/ACT AERO Inhale 2 puffs into the lungs 2 (two) times daily. (Patient taking differently: Inhale 2 puffs into the lungs daily. )  . omeprazole (PRILOSEC) 20 MG capsule Take 20 mg by mouth 2 (two) times daily.  Marland Kitchen. azithromycin (ZITHROMAX) 250 MG tablet Take 2 tabs as 1st dose then 1 tab daily for 4 days. (Patient not taking: Reported on 06/03/2015)  . beclomethasone (QVAR) 80 MCG/ACT inhaler Inhale 2 puffs into the lungs 2 (two) times daily.   . cetirizine (ZYRTEC) 10 MG tablet Take 10 mg by mouth daily.  . diclofenac (VOLTAREN) 75 MG EC tablet Take 1 tablet (75 mg total) by mouth  2 (two) times daily. (Patient not taking: Reported on 06/03/2015)  . glycopyrrolate (ROBINUL) 2 MG tablet Take 1 tab twice daily as needed for cramping and spasms. (Patient not taking: Reported on 06/03/2015)  . lisinopril-hydrochlorothiazide (PRINZIDE,ZESTORETIC) 10-12.5 MG per tablet Take 1 tablet by mouth daily. (Patient not taking: Reported on 06/03/2015)  . mometasone (NASONEX) 50 MCG/ACT nasal spray Place 2 sprays into the nose daily.  . mometasone-formoterol (DULERA) 200-5 MCG/ACT AERO Inhale 2 puffs into the lungs 2 (two) times daily.   No facility-administered medications prior to visit.    Meds ordered this encounter  Medications  . Albuterol Sulfate (PROAIR RESPICLICK) 108 (90 BASE) MCG/ACT AEPB    Sig: Inhale 2 puffs into the lungs every 4 (four) hours as needed (for cough or wheeze).    Dispense:  1 each    Refill:  1  . methylPREDNISolone acetate (DEPO-MEDROL) injection 80 mg    Sig:     Past Medical History  Diagnosis Date  . Hypertension   . Asthma   . Anxiety   . Depression   . GERD (gastroesophageal reflux disease)   . Kidney stones     Past Surgical History  Procedure Laterality Date  . Ovarian cyst removal    . Bunionectomy Left   . Abdominal hysterectomy  2000    Abnormal PAPs    Allergies  Allergen Reactions  . Morphine     REACTION: per pt gives her hives    Review of Systems  Constitutional: Negative.   HENT: Positive  for congestion. Negative for ear discharge, ear pain, nosebleeds and sore throat.   Eyes: Negative.   Respiratory: Positive for cough, shortness of breath and wheezing.   Cardiovascular: Negative.   Gastrointestinal: Negative.   Skin: Negative.   Neurological: Negative for headaches.     Objective:   Filed Vitals:   06/03/15 1113  BP: 130/70  Pulse: 88  Temp: 99 F (37.2 C)  Resp: 17          Physical Exam  Constitutional: She is well-developed, well-nourished, and in no distress. No distress.  Coughing and  throat clearing  HENT:  Head: Normocephalic and atraumatic. Head is without right periorbital erythema and without left periorbital erythema.  Right Ear: Tympanic membrane, external ear and ear canal normal. No drainage. No foreign bodies. Tympanic membrane is not injected, not scarred, not perforated, not erythematous, not retracted and not bulging. No middle ear effusion.  Left Ear: Tympanic membrane, external ear and ear canal normal. No drainage. No foreign bodies. Tympanic membrane is not injected, not scarred, not perforated, not erythematous, not retracted and not bulging.  No middle ear effusion.  Nose: Nose normal. No mucosal edema, rhinorrhea, nose lacerations, sinus tenderness, nasal deformity, septal deviation or nasal septal hematoma. No epistaxis.  Mouth/Throat: Oropharynx is clear and moist and mucous membranes are normal. No oropharyngeal exudate, posterior oropharyngeal edema, posterior oropharyngeal erythema or tonsillar abscesses.  Eyes: Conjunctivae and lids are normal. Pupils are equal, round, and reactive to light. Right eye exhibits no discharge and no exudate. No foreign body present in the right eye. Left eye exhibits no discharge and no exudate. No foreign body present in the left eye. Right conjunctiva is not injected. Right conjunctiva has no hemorrhage. Left conjunctiva is not injected. Left conjunctiva has no hemorrhage. No scleral icterus.  Neck: No tracheal tenderness present. No tracheal deviation present. No thyromegaly present.  Cardiovascular: Normal rate, regular rhythm, S1 normal, S2 normal and normal heart sounds.  Exam reveals no gallop and no friction rub.   No murmur heard. Pulmonary/Chest: Effort normal. No stridor. No respiratory distress. She has wheezes. She has no rhonchi. She has no rales. She exhibits no tenderness.  Musculoskeletal: She exhibits no edema or tenderness.  Lymphadenopathy:    She has no cervical adenopathy.  Skin: No purpura and no rash  noted. Rash is not macular, not maculopapular, not nodular, not pustular, not vesicular and not urticarial. She is not diaphoretic. No cyanosis or erythema. No pallor. Nails show no clubbing.  Psychiatric: Mood, affect and judgment normal.    Diagnostics:    Spirometry was performed and demonstrated an FEV1 of 2.46 at 87 % of predicted.  The patient had an Asthma Control Test with the following results: ACT Total Score: 13.    Assessment and Plan:   1. Moderate persistent asthma, uncomplicated   2. Other allergic rhinitis   3. Gastroesophageal reflux disease, esophagitis presence not specified      1. Depomedrol 80 IM now  2. Continue Dulera 200 two inhalations two times per day - samples  3. Continue Nasonex or OTC Rhinocort (Coupon) one spray each nostril 1 time per day   4. Continue omeprazole 20 one tablet one time per day  5. Use ProAir Respiclick (Coupon) 2 inhalations ebery 2-4 hours or albuterol nebulization every 4-6 hours if needed.  6. Return in 6 months or earlier if problem  And I will keep in contact with me noting her response to treatment. We gave her samples  of Dulera today and she can return to this clinic for more samples if she runs out. Apparently she will be obtaining insurance coverage in January.   Laurette Schimke, MD [facility]

## 2015-08-22 ENCOUNTER — Emergency Department (HOSPITAL_COMMUNITY)
Admission: EM | Admit: 2015-08-22 | Discharge: 2015-08-22 | Disposition: A | Discharge status: Home / Self Care | Payer: Self-pay | Attending: Emergency Medicine | Admitting: Emergency Medicine

## 2015-08-22 ENCOUNTER — Emergency Department (HOSPITAL_COMMUNITY): Payer: Self-pay

## 2015-08-22 ENCOUNTER — Encounter (HOSPITAL_COMMUNITY): Payer: Self-pay

## 2015-08-22 DIAGNOSIS — Z87891 Personal history of nicotine dependence: Secondary | ICD-10-CM | POA: Insufficient documentation

## 2015-08-22 DIAGNOSIS — K219 Gastro-esophageal reflux disease without esophagitis: Secondary | ICD-10-CM | POA: Insufficient documentation

## 2015-08-22 DIAGNOSIS — Z8639 Personal history of other endocrine, nutritional and metabolic disease: Secondary | ICD-10-CM | POA: Insufficient documentation

## 2015-08-22 DIAGNOSIS — Z87442 Personal history of urinary calculi: Secondary | ICD-10-CM | POA: Insufficient documentation

## 2015-08-22 DIAGNOSIS — Z79899 Other long term (current) drug therapy: Secondary | ICD-10-CM | POA: Insufficient documentation

## 2015-08-22 DIAGNOSIS — I10 Essential (primary) hypertension: Secondary | ICD-10-CM | POA: Insufficient documentation

## 2015-08-22 DIAGNOSIS — J4541 Moderate persistent asthma with (acute) exacerbation: Secondary | ICD-10-CM | POA: Insufficient documentation

## 2015-08-22 MED ORDER — IPRATROPIUM-ALBUTEROL 0.5-2.5 (3) MG/3ML IN SOLN
3.0000 mL | Freq: Once | RESPIRATORY_TRACT | Status: AC
  Administered 2015-08-22: 3 mL via RESPIRATORY_TRACT
  Filled 2015-08-22: qty 3

## 2015-08-22 MED ORDER — IPRATROPIUM-ALBUTEROL 0.5-2.5 (3) MG/3ML IN SOLN: 3.0000 mL | Freq: Once | RESPIRATORY_TRACT | Status: AC

## 2015-08-22 MED ORDER — ALBUTEROL SULFATE (2.5 MG/3ML) 0.083% IN NEBU
5.0000 mg | INHALATION_SOLUTION | Freq: Once | RESPIRATORY_TRACT | Status: AC
  Administered 2015-08-22: 5 mg via RESPIRATORY_TRACT
  Filled 2015-08-22: qty 6

## 2015-08-22 MED ORDER — PREDNISONE 10 MG PO TABS: 40.0000 mg | ORAL_TABLET | Freq: Every day | ORAL | Status: DC

## 2015-08-22 MED ORDER — PREDNISONE 20 MG PO TABS
60.0000 mg | ORAL_TABLET | Freq: Once | ORAL | Status: AC
  Administered 2015-08-22: 60 mg via ORAL
  Filled 2015-08-22: qty 3

## 2015-08-22 MED ORDER — MOMETASONE FURO-FORMOTEROL FUM 200-5 MCG/ACT IN AERO: 2.0000 | INHALATION_SPRAY | Freq: Two times a day (BID) | RESPIRATORY_TRACT | Status: DC

## 2015-08-22 MED ORDER — IBUPROFEN 200 MG PO TABS
600.0000 mg | ORAL_TABLET | Freq: Once | ORAL | Status: AC
  Administered 2015-08-22: 600 mg via ORAL
  Filled 2015-08-22: qty 3

## 2015-08-22 MED ORDER — ALBUTEROL SULFATE HFA 108 (90 BASE) MCG/ACT IN AERS: 2.0000 | INHALATION_SPRAY | Freq: Four times a day (QID) | RESPIRATORY_TRACT | Status: DC | PRN

## 2015-08-22 NOTE — ED Provider Notes (Signed)
CSN: [id number]     Arrival date & time 08/22/15  [id number] History   First MD Initiated Contact with Patient 08/22/15 [id number]     Chief Complaint  Patient presents with  . Asthma     (Consider location/radiation/quality/duration/timing/severity/associated sxs/prior Treatment) Patient is a 45 y.o. female presenting with asthma. The history is provided by the patient.  Asthma Associated symptoms include shortness of breath. Pertinent negatives include no chest pain, no abdominal pain and no headaches.   patient with known history of asthma with acute exacerbation of the asthma starting yesterday associated with cough and chills nonproductive cough. No real relief from her current medications to include an albuterol inhaler. Patient denies fever. Denies upper respiratory infection symptoms.  Past Medical History  Diagnosis Date  . Hypertension   . Asthma   . Anxiety   . Depression   . GERD (gastroesophageal reflux disease)   . Kidney stones    Past Surgical History  Procedure Laterality Date  . Ovarian cyst removal    . Bunionectomy Left   . Abdominal hysterectomy  2000    Abnormal PAPs   Family History  Problem Relation Age of Onset  . Breast cancer Maternal Grandmother   . Colon cancer Maternal Aunt   . Colon polyps Cousin   . Colon polyps Maternal Aunt   . Diabetes Father   . Heart disease Father   . Kidney disease Father   . Kidney cancer Maternal Uncle    Social History  Substance Use Topics  . Smoking status: Former Smoker -- 12 years    Types: Cigarettes    Quit date: 03/19/2013  . Smokeless tobacco: Never Used     Comment: form given 09/25/12  . Alcohol Use: No     Comment: rarely   OB History    No data available     Review of Systems  Constitutional: Positive for chills. Negative for fever.  HENT: Negative for congestion.   Eyes: Negative for visual disturbance.  Respiratory: Positive for cough, shortness of breath and wheezing.   Cardiovascular: Negative  for chest pain.  Gastrointestinal: Negative for abdominal pain.  Genitourinary: Negative for dysuria.  Musculoskeletal: Negative for back pain.  Skin: Negative for rash.  Neurological: Negative for headaches.  Hematological: Does not bruise/bleed easily.  Psychiatric/Behavioral: Negative for confusion.      Allergies  Morphine  Home Medications   Prior to Admission medications   Medication Sig Start Date End Date Taking? Authorizing Provider  diphenhydrAMINE (BENADRYL) 25 MG tablet Take 25 mg by mouth at bedtime.   Yes Historical Provider, MD  ibuprofen (ADVIL,MOTRIN) 200 MG tablet Take 400 mg by mouth every 6 (six) hours as needed for headache.   Yes Historical Provider, MD  omeprazole (PRILOSEC) 20 MG capsule Take 20 mg by mouth 2 (two) times daily.   Yes Historical Provider, MD  valACYclovir (VALTREX) 1000 MG tablet Take 1,000 mg by mouth daily.   Yes Historical Provider, MD  albuterol (PROVENTIL HFA;VENTOLIN HFA) 108 (90 Base) MCG/ACT inhaler Inhale 2 puffs into the lungs every 6 (six) hours as needed for wheezing or shortness of breath. 08/22/15   Vanetta MuldersScott Luddie Boghosian, MD  mometasone-formoterol (DULERA) 200-5 MCG/ACT AERO Inhale 2 puffs into the lungs 2 (two) times daily. 08/22/15   Vanetta MuldersScott Naama Sappington, MD  predniSONE (DELTASONE) 10 MG tablet Take 4 tablets (40 mg total) by mouth daily. 08/22/15   Vanetta MuldersScott Bon Dowis, MD   BP 155/101 mmHg  Pulse 113  Temp(Src) 99.4 F (37.4 C) (  Oral)  Resp 20  SpO2 94% Physical Exam  Constitutional: She is oriented to person, place, and time. She appears well-developed and well-nourished. No distress.  HENT:  Head: Normocephalic and atraumatic.  Mouth/Throat: Oropharynx is clear and moist.  Eyes: Conjunctivae and EOM are normal. Pupils are equal, round, and reactive to light.  Neck: Normal range of motion.  Cardiovascular: Normal rate, regular rhythm and normal heart sounds.   No murmur heard. Pulmonary/Chest: Effort normal. No respiratory distress.  She has wheezes.  Abdominal: Soft. Bowel sounds are normal. There is no tenderness.  Musculoskeletal: Normal range of motion.  Neurological: She is alert and oriented to person, place, and time. No cranial nerve deficit. She exhibits normal muscle tone. Coordination normal.  Skin: Skin is warm. No erythema.  Nursing note and vitals reviewed.   ED Course  Procedures (including critical care time) Labs Review Labs Reviewed - No data to display  Imaging Review Dg Chest 2 View  08/22/2015  CLINICAL DATA:  Severe shortness of breath beginning yesterday with weakness and dizziness. History of asthma. EXAM: CHEST  2 VIEW COMPARISON:  02/20/2011 FINDINGS: The lungs are adequately inflated without focal consolidation or effusion. Cardiomediastinal silhouette and remainder of the exam is unchanged. IMPRESSION: No active cardiopulmonary disease. Electronically Signed   By: Elberta Fortis M.D.   On: 08/22/2015 10:09   I have personally reviewed and evaluated these images and lab results as part of my medical decision-making.   EKG Interpretation None      MDM   Final diagnoses:  Asthma, moderate persistent, with acute exacerbation    Patient with known history of asthma. Patient with an exacerbation of the asthma starting yesterday. Associated with cough and chills but no distinct upper respiratory infection.  Patient treated with the albuterol Atrovent nebulizers 2 and also given 60 mg of prednisone orally. Significant improvement in the wheezing patient overall feels better wheezing has not completely resolved. Chest x-ray was negative for pneumonia pneumothorax or any signs of pulmonary edema.  Patient the will be discharged home on a course of prednisone and renewal for her albuterol and dulera.  Patient will return for any new or worse symptoms.    Vanetta Mulders, MD 08/22/15 1311

## 2015-08-22 NOTE — ED Notes (Signed)
She c/o wheezing "and my home meds just aren't working".  She is short of breath with audible wheezing.

## 2015-08-22 NOTE — Discharge Instructions (Signed)
Asthma, Adult Asthma is a condition of the lungs in which the airways tighten and narrow. Asthma can make it hard to breathe. Asthma cannot be cured, but medicine and lifestyle changes can help control it. Asthma may be started (triggered) by:  Animal skin flakes (dander).  Dust.  Cockroaches.  Pollen.  Mold.  Smoke.  Cleaning products.  Hair sprays or aerosol sprays.  Paint fumes or strong smells.  Cold air, weather changes, and winds.  Crying or laughing hard.  Stress.  Certain medicines or drugs.  Foods, such as dried fruit, potato chips, and sparkling grape juice.  Infections or conditions (colds, flu).  Exercise.  Certain medical conditions or diseases.  Exercise or tiring activities. HOME CARE   Take medicine as told by your doctor.  Use a peak flow meter as told by your doctor. A peak flow meter is a tool that measures how well the lungs are working.  Record and keep track of the peak flow meter's readings.  Understand and use the asthma action plan. An asthma action plan is a written plan for taking care of your asthma and treating your attacks.  To help prevent asthma attacks:  Do not smoke. Stay away from secondhand smoke.  Change your heating and air conditioning filter often.  Limit your use of fireplaces and wood stoves.  Get rid of pests (such as roaches and mice) and their droppings.  Throw away plants if you see mold on them.  Clean your floors. Dust regularly. Use cleaning products that do not smell.  Have someone vacuum when you are not home. Use a vacuum cleaner with a HEPA filter if possible.  Replace carpet with wood, tile, or vinyl flooring. Carpet can trap animal skin flakes and dust.  Use allergy-proof pillows, mattress covers, and box spring covers.  Wash bed sheets and blankets every week in hot water and dry them in a dryer.  Use blankets that are made of polyester or cotton.  Clean bathrooms and kitchens with bleach.  If possible, have someone repaint the walls in these rooms with mold-resistant paint. Keep out of the rooms that are being cleaned and painted.  Wash hands often. GET HELP IF:  You have make a whistling sound when breaking (wheeze), have shortness of breath, or have a cough even if taking medicine to prevent attacks.  The colored mucus you cough up (sputum) is thicker than usual.  The colored mucus you cough up changes from clear or white to yellow, green, gray, or bloody.  You have problems from the medicine you are taking such as:  A rash.  Itching.  Swelling.  Trouble breathing.  You need reliever medicines more than 2-3 times a week.  Your peak flow measurement is still at 50-79% of your personal best after following the action plan for 1 hour.  You have a fever. GET HELP RIGHT AWAY IF:   You seem to be worse and are not responding to medicine during an asthma attack.  You are short of breath even at rest.  You get short of breath when doing very little activity.  You have trouble eating, drinking, or talking.  You have chest pain.  You have a fast heartbeat.  Your lips or fingernails start to turn blue.  You are light-headed, dizzy, or faint.  Your peak flow is less than 50% of your personal best.   This information is not intended to replace advice given to you by your health care provider. Make sure  you discuss any questions you have with your health care provider.   Prednisone as directed for the next 5 days. In addition we have renewed your albuterol inhaler and dulera off inhaler as well. Return for any new or worse symptoms. Make an appointment to follow-up with your doctor.   Document Released: 01/11/2008 Document Revised: 04/15/2015 Document Reviewed: 02/21/2013 Elsevier Interactive Patient Education [employer]2016 [employer]vier Inc.

## 2015-10-01 ENCOUNTER — Inpatient Hospital Stay (HOSPITAL_COMMUNITY)
Admission: EM | Admit: 2015-10-01 | Discharge: 2015-10-06 | DRG: 193 | Disposition: A | Discharge status: Home / Self Care | Payer: Self-pay | Attending: Internal Medicine | Admitting: Internal Medicine

## 2015-10-01 ENCOUNTER — Ambulatory Visit: Payer: Self-pay | Admitting: Allergy and Immunology

## 2015-10-01 ENCOUNTER — Emergency Department (HOSPITAL_COMMUNITY): Payer: Self-pay

## 2015-10-01 ENCOUNTER — Telehealth: Payer: Self-pay | Admitting: Allergy and Immunology

## 2015-10-01 ENCOUNTER — Encounter (HOSPITAL_COMMUNITY): Payer: Self-pay | Admitting: Emergency Medicine

## 2015-10-01 DIAGNOSIS — K219 Gastro-esophageal reflux disease without esophagitis: Secondary | ICD-10-CM | POA: Diagnosis present

## 2015-10-01 DIAGNOSIS — R739 Hyperglycemia, unspecified: Secondary | ICD-10-CM | POA: Diagnosis present

## 2015-10-01 DIAGNOSIS — T380X5A Adverse effect of glucocorticoids and synthetic analogues, initial encounter: Secondary | ICD-10-CM | POA: Diagnosis present

## 2015-10-01 DIAGNOSIS — J101 Influenza due to other identified influenza virus with other respiratory manifestations: Principal | ICD-10-CM | POA: Diagnosis present

## 2015-10-01 DIAGNOSIS — I160 Hypertensive urgency: Secondary | ICD-10-CM | POA: Diagnosis present

## 2015-10-01 DIAGNOSIS — I10 Essential (primary) hypertension: Secondary | ICD-10-CM | POA: Diagnosis present

## 2015-10-01 DIAGNOSIS — D72829 Elevated white blood cell count, unspecified: Secondary | ICD-10-CM | POA: Diagnosis present

## 2015-10-01 DIAGNOSIS — J45902 Unspecified asthma with status asthmaticus: Secondary | ICD-10-CM | POA: Diagnosis present

## 2015-10-01 DIAGNOSIS — Z8249 Family history of ischemic heart disease and other diseases of the circulatory system: Secondary | ICD-10-CM

## 2015-10-01 DIAGNOSIS — Z90710 Acquired absence of both cervix and uterus: Secondary | ICD-10-CM

## 2015-10-01 DIAGNOSIS — F418 Other specified anxiety disorders: Secondary | ICD-10-CM | POA: Diagnosis present

## 2015-10-01 DIAGNOSIS — Z87891 Personal history of nicotine dependence: Secondary | ICD-10-CM

## 2015-10-01 DIAGNOSIS — J9601 Acute respiratory failure with hypoxia: Secondary | ICD-10-CM

## 2015-10-01 DIAGNOSIS — J4551 Severe persistent asthma with (acute) exacerbation: Secondary | ICD-10-CM

## 2015-10-01 LAB — I-STAT CHEM 8, ED
BUN: 10 mg/dL (ref 6–20)
CREATININE: 0.7 mg/dL (ref 0.44–1.00)
Calcium, Ion: 1.05 mmol/L — ABNORMAL LOW (ref 1.12–1.23)
Chloride: 104 mmol/L (ref 101–111)
Glucose, Bld: 184 mg/dL — ABNORMAL HIGH (ref 65–99)
HEMATOCRIT: 47 % — AB (ref 36.0–46.0)
HEMOGLOBIN: 16 g/dL — AB (ref 12.0–15.0)
POTASSIUM: 3.6 mmol/L (ref 3.5–5.1)
SODIUM: 139 mmol/L (ref 135–145)
TCO2: 19 mmol/L (ref 0–100)

## 2015-10-01 LAB — CBC WITH DIFFERENTIAL/PLATELET
BASOS ABS: 0 10*3/uL (ref 0.0–0.1)
BASOS PCT: 0 %
EOS ABS: 0 10*3/uL (ref 0.0–0.7)
Eosinophils Relative: 0 %
HEMATOCRIT: 43 % (ref 36.0–46.0)
Hemoglobin: 15 g/dL (ref 12.0–15.0)
Lymphocytes Relative: 3 %
Lymphs Abs: 0.5 10*3/uL — ABNORMAL LOW (ref 0.7–4.0)
MCH: 33.7 pg (ref 26.0–34.0)
MCHC: 34.9 g/dL (ref 30.0–36.0)
MCV: 96.6 fL (ref 78.0–100.0)
MONO ABS: 1 10*3/uL (ref 0.1–1.0)
MONOS PCT: 6 %
NEUTROS ABS: 13.9 10*3/uL — AB (ref 1.7–7.7)
Neutrophils Relative %: 91 %
PLATELETS: 219 10*3/uL (ref 150–400)
RBC: 4.45 MIL/uL (ref 3.87–5.11)
RDW: 13.2 % (ref 11.5–15.5)
WBC: 15.4 10*3/uL — ABNORMAL HIGH (ref 4.0–10.5)

## 2015-10-01 LAB — MAGNESIUM: MAGNESIUM: 1.8 mg/dL (ref 1.7–2.4)

## 2015-10-01 LAB — PHOSPHORUS: PHOSPHORUS: 2.9 mg/dL (ref 2.5–4.6)

## 2015-10-01 MED ORDER — LABETALOL HCL 5 MG/ML IV SOLN
10.0000 mg | Freq: Once | INTRAVENOUS | Status: AC
  Administered 2015-10-01: 10 mg via INTRAVENOUS
  Filled 2015-10-01: qty 4

## 2015-10-01 MED ORDER — IPRATROPIUM BROMIDE 0.02 % IN SOLN
0.5000 mg | Freq: Once | RESPIRATORY_TRACT | Status: DC
  Filled 2015-10-01: qty 2.5

## 2015-10-01 MED ORDER — ALBUTEROL SULFATE (2.5 MG/3ML) 0.083% IN NEBU
5.0000 mg | INHALATION_SOLUTION | Freq: Once | RESPIRATORY_TRACT | Status: AC
  Administered 2015-10-01: 5 mg via RESPIRATORY_TRACT
  Filled 2015-10-01: qty 6

## 2015-10-01 MED ORDER — LEVALBUTEROL HCL 0.63 MG/3ML IN NEBU
1.8900 mg | INHALATION_SOLUTION | Freq: Once | RESPIRATORY_TRACT | Status: AC
  Administered 2015-10-01: 1.89 mg via RESPIRATORY_TRACT
  Filled 2015-10-01: qty 9

## 2015-10-01 MED ORDER — FENTANYL CITRATE (PF) 100 MCG/2ML IJ SOLN
50.0000 ug | Freq: Once | INTRAMUSCULAR | Status: AC
Start: 2015-10-01 — End: 2015-10-01
  Administered 2015-10-01: 50 ug via INTRAVENOUS
  Filled 2015-10-01: qty 2

## 2015-10-01 MED ORDER — MAGNESIUM SULFATE 2 GM/50ML IV SOLN
2.0000 g | Freq: Once | INTRAVENOUS | Status: AC
Start: 2015-10-01 — End: 2015-10-01
  Administered 2015-10-01: 2 g via INTRAVENOUS
  Filled 2015-10-01: qty 50

## 2015-10-01 MED ORDER — KETOROLAC TROMETHAMINE 30 MG/ML IJ SOLN
30.0000 mg | Freq: Once | INTRAMUSCULAR | Status: AC
  Administered 2015-10-01: 30 mg via INTRAVENOUS
  Filled 2015-10-01: qty 1

## 2015-10-01 MED ORDER — PANTOPRAZOLE SODIUM 40 MG IV SOLR
40.0000 mg | INTRAVENOUS | Status: DC
  Administered 2015-10-01 – 2015-10-02 (×2): 40 mg via INTRAVENOUS
  Filled 2015-10-01 (×2): qty 40

## 2015-10-01 MED ORDER — ALBUTEROL (5 MG/ML) CONTINUOUS INHALATION SOLN
10.0000 mg/h | INHALATION_SOLUTION | Freq: Once | RESPIRATORY_TRACT | Status: AC
  Administered 2015-10-01: 10 mg/h via RESPIRATORY_TRACT

## 2015-10-01 MED ORDER — SODIUM CHLORIDE 0.9 % IV SOLN
Freq: Once | INTRAVENOUS | Status: AC
  Administered 2015-10-01: 19:00:00 via INTRAVENOUS

## 2015-10-01 MED ORDER — ALBUTEROL SULFATE (2.5 MG/3ML) 0.083% IN NEBU: 2.5000 mg | INHALATION_SOLUTION | RESPIRATORY_TRACT | Status: DC | PRN

## 2015-10-01 MED ORDER — IPRATROPIUM-ALBUTEROL 0.5-2.5 (3) MG/3ML IN SOLN
3.0000 mL | Freq: Four times a day (QID) | RESPIRATORY_TRACT | Status: DC
  Administered 2015-10-02 – 2015-10-04 (×11): 3 mL via RESPIRATORY_TRACT
  Filled 2015-10-01 (×13): qty 3

## 2015-10-01 MED ORDER — ALBUTEROL (5 MG/ML) CONTINUOUS INHALATION SOLN
10.0000 mg/h | INHALATION_SOLUTION | Freq: Once | RESPIRATORY_TRACT | Status: DC
  Filled 2015-10-01: qty 20

## 2015-10-01 MED ORDER — METHYLPREDNISOLONE SODIUM SUCC 125 MG IJ SOLR
125.0000 mg | Freq: Once | INTRAMUSCULAR | Status: AC
  Administered 2015-10-01: 125 mg via INTRAVENOUS
  Filled 2015-10-01: qty 2

## 2015-10-01 NOTE — ED Notes (Signed)
Notified Dr Rhunette Croft of pt's continued hypertension.

## 2015-10-01 NOTE — H&P (Signed)
[facility] History and Physical  Toyoko Silos ZOX:[id number] DOB: 26-Oct-1970 DOA: 10/01/2015  Referring physician: Derwood Kaplan, MD PCP: Elvina Sidle, MD   Chief Complaint: Asthma and high blood pressure.  HPI: Laura Clay is a 45 y.o. female with a past medical history of asthma, known history dependent, never intubated, hypertension, GERD, urolithiasis who is coming to the emergency department via EMS with complaints of having an asthmatic attack since last night which has not responded to multiple nebulizer treatments at home and oral prednisone. No recent travel history or sick contacts to her knowledge.  When seen in the emergency department, the patient was on BiPAP ventilation and stated that she was feeling a little better. However, she still could not talk in full sentences.   Workup in the emergency department showed a normal chest radiograph, leukocytosis and hyperglycemia. Influenza by PCR was later confirmed to be positive for influenza A virus.   Review of Systems:  Unable to fully review since the patient was on BiPAP ventilation. History was mostly provided by the patient's daughter.  Past Medical History  Diagnosis Date  . Hypertension   . Asthma   . Anxiety   . Depression   . GERD (gastroesophageal reflux disease)   . Kidney stones    Past Surgical History  Procedure Laterality Date  . Ovarian cyst removal    . Bunionectomy Left   . Abdominal hysterectomy  2000    Abnormal PAPs   Social History:  reports that she quit smoking about 2 years ago. Her smoking use included Cigarettes. She quit after 12 years of use. She has never used smokeless tobacco. She reports that she does not drink alcohol or use illicit drugs.  Allergies  Allergen Reactions  . Morphine     REACTION: per pt gives her hives    Family History  Problem Relation Age of Onset  . Breast cancer Maternal Grandmother   . Colon cancer Maternal Aunt   . Colon polyps Cousin   .  Colon polyps Maternal Aunt   . Diabetes Father   . Heart disease Father   . Kidney disease Father   . Kidney cancer Maternal Uncle     Prior to Admission medications   Medication Sig Start Date End Date Taking? Authorizing Provider  albuterol (PROVENTIL HFA;VENTOLIN HFA) 108 (90 Base) MCG/ACT inhaler Inhale 2 puffs into the lungs every 6 (six) hours as needed for wheezing or shortness of breath. 08/22/15  Yes Vanetta Mulders, MD  albuterol (PROVENTIL) (2.5 MG/3ML) 0.083% nebulizer solution Take 3 mLs (2.5 mg total) by nebulization every 4 (four) hours as needed for wheezing or shortness of breath. 10/01/15  Yes Jessica Priest, MD  dextromethorphan-guaiFENesin [facility] DM) 30-600 MG 12hr tablet Take 1 tablet by mouth 2 (two) times daily as needed for cough.   Yes Historical Provider, MD  diphenhydrAMINE (BENADRYL) 25 MG tablet Take 25 mg by mouth at bedtime.   Yes Historical Provider, MD  ibuprofen (ADVIL,MOTRIN) 200 MG tablet Take 600 mg by mouth every 6 (six) hours as needed for headache.    Yes Historical Provider, MD  mometasone-formoterol (DULERA) 200-5 MCG/ACT AERO Inhale 2 puffs into the lungs 2 (two) times daily. 08/22/15  Yes Vanetta Mulders, MD  omeprazole (PRILOSEC) 20 MG capsule Take 20 mg by mouth 2 (two) times daily.   Yes Historical Provider, MD  predniSONE (DELTASONE) 10 MG tablet Take 4 tablets (40 mg total) by mouth daily. Patient taking differently: Take 10 mg by mouth 2 (  two) times daily as needed (sob).  08/22/15  Yes Vanetta Mulders, MD  valACYclovir (VALTREX) 1000 MG tablet Take 1,000 mg by mouth daily.   Yes Historical Provider, MD   Physical Exam: Filed Vitals:   10/01/15 2030 10/01/15 2100 10/01/15 2104 10/01/15 2130  BP: 173/110 178/117  180/117  Pulse: 104 108  108  Temp:      TempSrc:      Resp: Height:      Weight:      SpO2: 99% 97% 99% 100%    Wt Readings from Last 3 Encounters:  10/01/15 68.04 kg (150 lb)  11/17/13 69.673 kg (153 lb 9.6 oz)    10/16/13 72.122 kg (159 lb)    General:  Anxious and on BiPAP ventilation. Eyes: PERRL, normal lids, irises & conjunctiva ENT: grossly normal hearing, lips & tongue, oral mucosa is dry. Neck: no LAD, masses or thyromegaly Cardiovascular: Tachycardic at 108 bpm, no m/r/g. No LE edema. Telemetry: Sinus tachycardia. Respiratory: Decreased breath sounds bilaterally, bilateral wheezing and mild rhonchi. No accessory muscle use noticed, but the patient was using BiPAP. Abdomen: soft, ntnd Skin: no rash or induration seen on limited exam Musculoskeletal: grossly normal tone BUE/BLE Psychiatric:  anxious due to dyspnea and bronchodilator side effects, unable to speak in full                      sentences. Neuro: Awake, alert, oriented 3, grossly non-focal.          Labs on Admission:  Basic Metabolic Panel:  Recent Labs Lab 10/01/15 1824  NA 139  K 3.6  CL 104  GLUCOSE 184*  BUN 10  CREATININE 0.70   CBC:  Recent Labs Lab 10/01/15 1816 10/01/15 1824  WBC 15.4*  --   NEUTROABS 13.9*  --   HGB 15.0 16.0*  HCT 43.0 47.0*  MCV 96.6  --   PLT 219  --     Radiological Exams on Admission: Dg Chest Port 1 View  10/01/2015  CLINICAL DATA:  Tachycardia and hypertension.  Wheezing EXAM: PORTABLE CHEST 1 VIEW COMPARISON:  August 22, 2015 FINDINGS: No edema or consolidation. Heart size and pulmonary vascularity are normal. No pneumothorax. No adenopathy. No bone lesions. IMPRESSION: No edema or consolidation. Electronically Signed   By: Bretta Bang III M.D.   On: 10/01/2015 18:12     Assessment/Plan Principal Problem:   Status asthmaticus Admit to a stepdown. Continue BiPAP ventilation. Continue bronchodilators as needed. Magnesium sulfate 2 g IVPB 1 given Continue methylprednisolone 25 mg IVP every 6 hours.     Influenza A Started on Tamiflu.. Continue droplet precautions.  Active Problems:   HTN (hypertension) Hydralazine 10 mg every 4 hours as needed for  hypertension. Monitor blood pressure closely.    Leukocytosis Likely due to the patient using prednisone at home. Monitor WBC closely.    Hyperglycemia Check hemoglobin A1c. CBG monitoring while the patient is on methylprednisolone.   Code Status: Full code. DVT Prophylaxis: Lovenox SQ. Family Communication: Her 2 daughters are present in the room. Disposition Plan: Admit to a stepdown for BiPAP ventilation and asthma exacerbation treatment  Time spent: Over 70 minutes were spent in the process of this admission.  Bobette Mo, M.D. [facility] Pager [phone].

## 2015-10-01 NOTE — Telephone Encounter (Signed)
Medication sent in to [facility] pharmacy on [address] and patient notified.

## 2015-10-01 NOTE — ED Notes (Signed)
Bed: WA04 Expected date:  Expected time:  Means of arrival:  Comments: EMS-asthma

## 2015-10-01 NOTE — ED Notes (Signed)
Pt placed on BiPap due to fatigue from increased respiratory effort.

## 2015-10-01 NOTE — ED Notes (Signed)
MD notified of pt's vitals.

## 2015-10-01 NOTE — Telephone Encounter (Signed)
Pt called and wanted to see if we would call in some albuterol neb solution for her at [facility] because she change pharmacy.

## 2015-10-01 NOTE — ED Notes (Signed)
Pt presents from home via EMS c/o asthma attack since last night, treated at home with numerous neb treatments and rescue inhaler with no effect.  She is hypertensive and tachycardic on arrival at 200/121and HR 133 and reports that her baseline BP is normal but elevates when she has severe asthma attacks.  EMS administered  solu-medrol IV and additional neb tmt en route.  Audible expiratory wheezes on assessment.

## 2015-10-01 NOTE — ED Notes (Signed)
Notified Dr Rhunette Croft of pt's BP. No orders at this time.

## 2015-10-02 DIAGNOSIS — D72829 Elevated white blood cell count, unspecified: Secondary | ICD-10-CM

## 2015-10-02 DIAGNOSIS — J4522 Mild intermittent asthma with status asthmaticus: Secondary | ICD-10-CM

## 2015-10-02 DIAGNOSIS — I158 Other secondary hypertension: Secondary | ICD-10-CM

## 2015-10-02 DIAGNOSIS — R739 Hyperglycemia, unspecified: Secondary | ICD-10-CM

## 2015-10-02 LAB — COMPREHENSIVE METABOLIC PANEL
ALBUMIN: 3.7 g/dL (ref 3.5–5.0)
ALT: 25 U/L (ref 14–54)
ANION GAP: 11 (ref 5–15)
AST: 29 U/L (ref 15–41)
Alkaline Phosphatase: 54 U/L (ref 38–126)
BUN: 10 mg/dL (ref 6–20)
CO2: 19 mmol/L — AB (ref 22–32)
Calcium: 8.1 mg/dL — ABNORMAL LOW (ref 8.9–10.3)
Chloride: 105 mmol/L (ref 101–111)
Creatinine, Ser: 0.72 mg/dL (ref 0.44–1.00)
GFR calc Af Amer: >60 mL/min (ref >60)
GFR calc non Af Amer: >60 mL/min (ref >60)
GLUCOSE: 163 mg/dL — AB (ref 65–99)
POTASSIUM: 4.2 mmol/L (ref 3.5–5.1)
SODIUM: 135 mmol/L (ref 135–145)
Total Bilirubin: 0.4 mg/dL (ref 0.3–1.2)
Total Protein: 6.8 g/dL (ref 6.5–8.1)

## 2015-10-02 LAB — CBC WITH DIFFERENTIAL/PLATELET
BASOS ABS: 0 10*3/uL (ref 0.0–0.1)
BASOS PCT: 0 %
EOS ABS: 0 10*3/uL (ref 0.0–0.7)
Eosinophils Relative: 0 %
HCT: 40.8 % (ref 36.0–46.0)
HEMOGLOBIN: 13.7 g/dL (ref 12.0–15.0)
Lymphocytes Relative: 5 %
Lymphs Abs: 0.7 10*3/uL (ref 0.7–4.0)
MCH: 33.2 pg (ref 26.0–34.0)
MCHC: 33.6 g/dL (ref 30.0–36.0)
MCV: 98.8 fL (ref 78.0–100.0)
MONO ABS: 0.4 10*3/uL (ref 0.1–1.0)
MONOS PCT: 3 %
NEUTROS PCT: 92 %
Neutro Abs: 12.8 10*3/uL — ABNORMAL HIGH (ref 1.7–7.7)
Platelets: 223 10*3/uL (ref 150–400)
RBC: 4.13 MIL/uL (ref 3.87–5.11)
RDW: 13.5 % (ref 11.5–15.5)
WBC: 13.8 10*3/uL — ABNORMAL HIGH (ref 4.0–10.5)

## 2015-10-02 LAB — GLUCOSE, CAPILLARY
GLUCOSE-CAPILLARY: 121 mg/dL — AB (ref 65–99)
Glucose-Capillary: 145 mg/dL — ABNORMAL HIGH (ref 65–99)

## 2015-10-02 LAB — INFLUENZA PANEL BY PCR (TYPE A & B)
H1N1 flu by pcr: NOT DETECTED
INFLAPCR: POSITIVE — AB
Influenza B By PCR: NEGATIVE

## 2015-10-02 LAB — MRSA PCR SCREENING: MRSA BY PCR: NEGATIVE

## 2015-10-02 MED ORDER — OSELTAMIVIR PHOSPHATE 75 MG PO CAPS
75.0000 mg | ORAL_CAPSULE | Freq: Two times a day (BID) | ORAL | Status: AC
  Administered 2015-10-02 – 2015-10-06 (×10): 75 mg via ORAL
  Filled 2015-10-02 (×13): qty 1

## 2015-10-02 MED ORDER — GUAIFENESIN ER 600 MG PO TB12
600.0000 mg | ORAL_TABLET | Freq: Two times a day (BID) | ORAL | Status: DC
  Administered 2015-10-02 – 2015-10-06 (×9): 600 mg via ORAL
  Filled 2015-10-02 (×10): qty 1

## 2015-10-02 MED ORDER — HYDRALAZINE HCL 20 MG/ML IJ SOLN
20.0000 mg | Freq: Once | INTRAMUSCULAR | Status: AC
  Administered 2015-10-02: 20 mg via INTRAVENOUS
  Filled 2015-10-02: qty 1

## 2015-10-02 MED ORDER — OSELTAMIVIR PHOSPHATE 75 MG PO CAPS: 75.0000 mg | ORAL_CAPSULE | Freq: Two times a day (BID) | ORAL | Status: DC

## 2015-10-02 MED ORDER — METHYLPREDNISOLONE SODIUM SUCC 125 MG IJ SOLR
125.0000 mg | Freq: Four times a day (QID) | INTRAMUSCULAR | Status: DC
  Administered 2015-10-02 (×3): 125 mg via INTRAVENOUS
  Filled 2015-10-02 (×3): qty 2

## 2015-10-02 MED ORDER — CHLORHEXIDINE GLUCONATE 0.12 % MT SOLN
15.0000 mL | Freq: Two times a day (BID) | OROMUCOSAL | Status: DC
  Administered 2015-10-02 – 2015-10-03 (×4): 15 mL via OROMUCOSAL
  Filled 2015-10-02 (×5): qty 15

## 2015-10-02 MED ORDER — METHYLPREDNISOLONE SODIUM SUCC 125 MG IJ SOLR
60.0000 mg | Freq: Four times a day (QID) | INTRAMUSCULAR | Status: DC
  Administered 2015-10-02 – 2015-10-03 (×4): 60 mg via INTRAVENOUS
  Filled 2015-10-02 (×4): qty 2

## 2015-10-02 MED ORDER — ALBUTEROL SULFATE (2.5 MG/3ML) 0.083% IN NEBU: 5.0000 mg | INHALATION_SOLUTION | RESPIRATORY_TRACT | Status: DC | PRN

## 2015-10-02 MED ORDER — SODIUM CHLORIDE 0.9 % IV SOLN
Freq: Once | INTRAVENOUS | Status: AC
  Administered 2015-10-02: 15:00:00 via INTRAVENOUS

## 2015-10-02 MED ORDER — CETYLPYRIDINIUM CHLORIDE 0.05 % MT LIQD
7.0000 mL | Freq: Two times a day (BID) | OROMUCOSAL | Status: DC
  Administered 2015-10-04: 7 mL via OROMUCOSAL

## 2015-10-02 MED ORDER — ONDANSETRON HCL 4 MG/2ML IJ SOLN
4.0000 mg | Freq: Four times a day (QID) | INTRAMUSCULAR | Status: DC | PRN
  Administered 2015-10-03: 4 mg via INTRAVENOUS
  Filled 2015-10-02: qty 2

## 2015-10-02 MED ORDER — VITAMINS A & D EX OINT
TOPICAL_OINTMENT | CUTANEOUS | Status: AC
  Filled 2015-10-02: qty 5

## 2015-10-02 MED ORDER — HYDRALAZINE HCL 20 MG/ML IJ SOLN
10.0000 mg | INTRAMUSCULAR | Status: DC | PRN
  Administered 2015-10-02 – 2015-10-04 (×8): 10 mg via INTRAVENOUS
  Filled 2015-10-02 (×8): qty 1

## 2015-10-02 MED ORDER — ENOXAPARIN SODIUM 40 MG/0.4ML ~~LOC~~ SOLN
40.0000 mg | Freq: Every day | SUBCUTANEOUS | Status: DC
  Administered 2015-10-02 – 2015-10-05 (×4): 40 mg via SUBCUTANEOUS
  Filled 2015-10-02 (×4): qty 0.4

## 2015-10-02 MED ORDER — VALACYCLOVIR HCL 500 MG PO TABS
1000.0000 mg | ORAL_TABLET | Freq: Every day | ORAL | Status: DC
  Administered 2015-10-02 – 2015-10-06 (×5): 1000 mg via ORAL
  Filled 2015-10-02 (×5): qty 2

## 2015-10-02 MED ORDER — LORAZEPAM 2 MG/ML IJ SOLN
1.0000 mg | Freq: Four times a day (QID) | INTRAMUSCULAR | Status: AC | PRN
  Administered 2015-10-02 (×2): 1 mg via INTRAVENOUS
  Filled 2015-10-02 (×2): qty 1

## 2015-10-02 MED ORDER — ONDANSETRON HCL 4 MG PO TABS: 4.0000 mg | ORAL_TABLET | Freq: Four times a day (QID) | ORAL | Status: DC | PRN

## 2015-10-02 MED ORDER — FENTANYL CITRATE (PF) 100 MCG/2ML IJ SOLN: 50.0000 ug | INTRAMUSCULAR | Status: DC | PRN

## 2015-10-02 MED ORDER — IBUPROFEN 200 MG PO TABS
600.0000 mg | ORAL_TABLET | ORAL | Status: DC | PRN
  Administered 2015-10-02 – 2015-10-05 (×5): 600 mg via ORAL
  Filled 2015-10-02 (×5): qty 3

## 2015-10-02 MED ORDER — GUAIFENESIN-DM 100-10 MG/5ML PO SYRP: 5.0000 mL | ORAL_SOLUTION | ORAL | Status: DC | PRN

## 2015-10-02 MED ORDER — POTASSIUM CHLORIDE IN NACL 40-0.9 MEQ/L-% IV SOLN
INTRAVENOUS | Status: DC
  Administered 2015-10-02: 100 mL/h via INTRAVENOUS
  Filled 2015-10-02: qty 1000

## 2015-10-02 MED ORDER — DIPHENHYDRAMINE HCL 25 MG PO CAPS
25.0000 mg | ORAL_CAPSULE | Freq: Every day | ORAL | Status: DC
  Administered 2015-10-02 – 2015-10-05 (×5): 25 mg via ORAL
  Filled 2015-10-02 (×6): qty 1

## 2015-10-02 MED ORDER — HYDROMORPHONE HCL 1 MG/ML IJ SOLN
1.0000 mg | INTRAMUSCULAR | Status: DC | PRN
  Administered 2015-10-03: 1 mg via INTRAVENOUS
  Filled 2015-10-02: qty 1

## 2015-10-02 NOTE — Care Management Note (Signed)
Case Management Note  Patient Details  Name: Laura Clay MRN: [id number] Date of Birth: 1970-12-28  Subjective/Objective:           resp failure requiring bipap         Action/Plan:Date: October 02, 2015 Chart reviewed for concurrent status and case management needs. Will continue to follow patient for changes and needs: Marcelle Smiling, BSN, RN, [place]   [phone]   Expected Discharge Date:   (unknown)               Expected Discharge Plan:  Home/Self Care  In-House Referral:  NA  Discharge planning Services  CM Consult  Post Acute Care Choice:  NA Choice offered to:  NA  DME Arranged:    DME Agency:     HH Arranged:    HH Agency:     Status of Service:  In process, will continue to follow  Medicare Important Message Given:    Date Medicare IM Given:    Medicare IM give by:    Date Additional Medicare IM Given:    Additional Medicare Important Message give by:     If discussed at Long Length of Stay Meetings, dates discussed:    Additional Comments:  Golda Acre, RN 10/02/2015, 8:11 AM

## 2015-10-02 NOTE — Progress Notes (Signed)
Patient ID: Laura Clay, female   DOB: Jul 15, 1971, 45 y.o.   MRN: [id number]  [facility] PROGRESS NOTE  Aniyla Harling LKG:[id number] DOB: 1971-04-16 DOA: 10-08-2015 PCP: Elvina Sidle, MD   Brief narrative:    45 y.o. female with a past medical history of asthma presented with dyspnea.   In ED, pt intially required BiPAP and did respond well [facility] asked to admit for further evaluation. SDU bed requested. Influenza by PCR was later confirmed to be positive for influenza A virus.  Assessment/Plan:    Principal Problem:   Status asthmaticus / Influenza A  - better this AM, on oxygen via [place] - keep in SDU as pt still with mild tachypnea - continue BD's scheduled and as needed, continue solumedrol  - treat influenza A with Tamiflu day #2  Active Problems:   HTN-ive urgency - place on hydralazine as needed for better BP control     Leukocytosis - from Influenza  A - CBC in AM    Hyperglycemia - steroid induced - insulin SSI   DVT prophylaxis - Lovenox SQ  Code Status: Full.  Family Communication:  plan of care discussed with the patient Disposition Plan: Home by 2/27  IV access:  Peripheral IV  Procedures and diagnostic studies:    Dg Chest [facility] 10/08/2015   No edema or consolidation.   Medical Consultants:  None  Other Consultants:  None   IAnti-Infectives:   Tamiflu 07-Oct-2022 -->  Debbora Presto, MD  [facility] Pager [phone]  If 7PM-7AM, please contact night-coverage [email or web address] Password [email or web address] 10/02/2015, 2:26 PM   LOS: 1 day   HPI/Subjective: No events overnight.   Objective: Filed Vitals:   10/02/15 0840 10/02/15 1000 10/02/15 1200 10/02/15 1400  BP: 143/93 159/97 178/116 151/99  Pulse: 124 122 104 115  Temp:   98.9 F (37.2 C)   TempSrc:   Oral   Resp: Height:      Weight:      SpO2: 97% 95% 96% 95%    Intake/Output Summary (Last 24 hours) at 10/02/15 1426 Last data filed at 10/02/15 0815  Gross per 24 hour  Intake    1120 ml  Output    400 ml  Net    720 ml    Exam:   General:  Pt is alert, follows commands appropriately, not in acute distress  Cardiovascular: Regular rhyth, tachycardic, S1/S2  Respiratory: Course breath sounds with exp wheezing   Abdomen: Soft, non tender, non distended, bowel sounds present, no guarding  Extremities: No edema, pulses DP and PT palpable bilaterally  Neuro: Grossly nonfocal  Data Reviewed: Basic Metabolic Panel:  Recent Labs Lab October 08, 2015 1810 October 08, 2015 1824 10/02/15 0325  NA  --  139 135  K  --  3.6 4.2  CL  --  104 105  CO2  --   --  19*  GLUCOSE  --  184* 163*  BUN  --  10 10  CREATININE  --  0.70 0.72  CALCIUM  --   --  8.1*  MG 1.8  --   --   PHOS 2.9  --   --    Liver Function Tests:  Recent Labs Lab 10/02/15 0325  AST 29  ALT 25  ALKPHOS 54  BILITOT 0.4  PROT 6.8  ALBUMIN 3.7   CBC:  Recent Labs Lab 10/08/2015 1816 10-08-15 1824 10/02/15 0325  WBC 15.4*  --  13.8*  NEUTROABS 13.9*  --  12.8*  HGB 15.0 16.0* 13.7  HCT 43.0 47.0* 40.8  MCV 96.6  --  98.8  PLT 219  --  223   CBG:  Recent Labs Lab 10/02/15 0749 10/02/15 1236  GLUCAP 145* 121*    Recent Results (from the past 240 hour(s))  MRSA PCR Screening     Status: None   Collection Time: 10/02/15 12:26 AM  Result Value Ref Range Status   MRSA by PCR NEGATIVE NEGATIVE Final    Comment:        The GeneXpert MRSA Assay (FDA approved for NASAL specimens only), is one component of a comprehensive MRSA colonization surveillance program. It is not intended to diagnose MRSA infection nor to guide or monitor treatment for MRSA infections.      Scheduled Meds: . albuterol  10 mg/hr Nebulization Once  . antiseptic oral rinse  7 mL Mouth Rinse q12n4p  . chlorhexidine  15 mL Mouth Rinse BID  . diphenhydrAMINE  25 mg Oral QHS  . enoxaparin (LOVENOX) injection  40 mg Subcutaneous Daily  . ipratropium  0.5 mg Nebulization Once  . ipratropium-albuterol  3 mL  Nebulization Q6H  . methylPREDNISolone (SOLU-MEDROL) injection  125 mg Intravenous Q6H  . oseltamivir  75 mg Oral BID  . pantoprazole (PROTONIX) IV  40 mg Intravenous Q24H  . valACYclovir  1,000 mg Oral Daily   Continuous Infusions:

## 2015-10-02 NOTE — Progress Notes (Signed)
Pt transported to ICU on BIPAP without incident.  Pt tolerated well, RT to monitor and assess as needed.  

## 2015-10-02 NOTE — ED Provider Notes (Signed)
CSN: [id number]     Arrival date & time 10/01/15  1641 History   First MD Initiated Contact with Patient 10/01/15 1723     Chief Complaint  Patient presents with  . Asthma  . Hypertension     (Consider location/radiation/quality/duration/timing/severity/associated sxs/prior Treatment) HPI Comments: Pt comes in with cc of DIB. PT has hx of asthma. Pt reports that she has been having worsening dyspnea for the past few days, but her breathing got worse last night. Pt has had 5+ treatments for both yday and today, and she has gotten worse. No intubation hx, no admission in the last 1 year, and not someone with multiple ER visits or steroids for asthma. Pt has no hx of PE, DVT and denies any exogenous estrogen use, long distance travels or surgery in the past 6 weeks, active cancer, recent immobilization. No chest pain. Pt has  Some myalgias. Pt also noted to be tachycardic ,and hypertensive - reports that her BP goes up when she has asthma exacerbation.   ROS 10 Systems reviewed and are negative for acute change except as noted in the HPI.      Patient is a 44 y.o. female presenting with asthma and hypertension. The history is provided by the patient.  Asthma  Hypertension    Past Medical History  Diagnosis Date  . Hypertension   . Asthma   . Anxiety   . Depression   . GERD (gastroesophageal reflux disease)   . Kidney stones    Past Surgical History  Procedure Laterality Date  . Ovarian cyst removal    . Bunionectomy Left   . Abdominal hysterectomy  2000    Abnormal PAPs   Family History  Problem Relation Age of Onset  . Breast cancer Maternal Grandmother   . Colon cancer Maternal Aunt   . Colon polyps Cousin   . Colon polyps Maternal Aunt   . Diabetes Father   . Heart disease Father   . Kidney disease Father   . Kidney cancer Maternal Uncle    Social History  Substance Use Topics  . Smoking status: Former Smoker -- 12 years    Types: Cigarettes    Quit date:  03/19/2013  . Smokeless tobacco: Never Used     Comment: form given 09/25/12  . Alcohol Use: No     Comment: rarely   OB History    No data available     Review of Systems    Allergies  Morphine  Home Medications   Prior to Admission medications   Medication Sig Start Date End Date Taking? Authorizing Provider  albuterol (PROVENTIL HFA;VENTOLIN HFA) 108 (90 Base) MCG/ACT inhaler Inhale 2 puffs into the lungs every 6 (six) hours as needed for wheezing or shortness of breath. 08/22/15  Yes Vanetta Mulders, MD  albuterol (PROVENTIL) (2.5 MG/3ML) 0.083% nebulizer solution Take 3 mLs (2.5 mg total) by nebulization every 4 (four) hours as needed for wheezing or shortness of breath. 10/01/15  Yes Jessica Priest, MD  dextromethorphan-guaiFENesin [facility] DM) 30-600 MG 12hr tablet Take 1 tablet by mouth 2 (two) times daily as needed for cough.   Yes Historical Provider, MD  diphenhydrAMINE (BENADRYL) 25 MG tablet Take 25 mg by mouth at bedtime.   Yes Historical Provider, MD  ibuprofen (ADVIL,MOTRIN) 200 MG tablet Take 600 mg by mouth every 6 (six) hours as needed for headache.    Yes Historical Provider, MD  mometasone-formoterol (DULERA) 200-5 MCG/ACT AERO Inhale 2 puffs into the lungs 2 (  two) times daily. 08/22/15  Yes Vanetta Mulders, MD  omeprazole (PRILOSEC) 20 MG capsule Take 20 mg by mouth 2 (two) times daily.   Yes Historical Provider, MD  predniSONE (DELTASONE) 10 MG tablet Take 4 tablets (40 mg total) by mouth daily. Patient taking differently: Take 10 mg by mouth 2 (two) times daily as needed (sob).  08/22/15  Yes Vanetta Mulders, MD  valACYclovir (VALTREX) 1000 MG tablet Take 1,000 mg by mouth daily.   Yes Historical Provider, MD   BP 165/106 mmHg  Pulse 110  Temp(Src) 99.2 F (37.3 C) (Oral)  Resp 28  Ht  (1.651 m)  Wt 150 lb (68.04 kg)  BMI 24.96 kg/m2  SpO2 100% Physical Exam  Constitutional: She is oriented to person, place, and time. She appears well-developed.   HENT:  Head: Normocephalic and atraumatic.  Eyes: EOM are normal.  Neck: Normal range of motion. Neck supple. No JVD present.  Cardiovascular: Normal rate.   Pulmonary/Chest: She is in respiratory distress. She has wheezes.  Abdominal: Bowel sounds are normal.  Musculoskeletal: She exhibits no edema or tenderness.  Lymphadenopathy:    She has no cervical adenopathy.  Neurological: She is alert and oriented to person, place, and time.  Skin: Skin is warm and dry.  Nursing note and vitals reviewed.   ED Course  Procedures (including critical care time) Labs Review Labs Reviewed  CBC WITH DIFFERENTIAL/PLATELET - Abnormal; Notable for the following:    WBC 15.4 (*)    Neutro Abs 13.9 (*)    Lymphs Abs 0.5 (*)    All other components within normal limits  I-STAT CHEM 8, ED - Abnormal; Notable for the following:    Glucose, Bld 184 (*)    Calcium, Ion 1.05 (*)    Hemoglobin 16.0 (*)    HCT 47.0 (*)    All other components within normal limits  MRSA PCR SCREENING  MAGNESIUM  PHOSPHORUS  INFLUENZA PANEL BY PCR (TYPE A & B, H1N1)  CBC  CREATININE, SERUM  CBC WITH DIFFERENTIAL/PLATELET  COMPREHENSIVE METABOLIC PANEL  HEMOGLOBIN A1C    Imaging Review Dg Chest Port 1 View  10/01/2015  CLINICAL DATA:  Tachycardia and hypertension.  Wheezing EXAM: PORTABLE CHEST 1 VIEW COMPARISON:  August 22, 2015 FINDINGS: No edema or consolidation. Heart size and pulmonary vascularity are normal. No pneumothorax. No adenopathy. No bone lesions. IMPRESSION: No edema or consolidation. Electronically Signed   By: Bretta Bang III M.D.   On: 10/01/2015 18:12   I have personally reviewed and evaluated these images and lab results as part of my medical decision-making.   EKG Interpretation None      MDM   Final diagnoses:  Acute respiratory failure with hypoxia (HCC)  Asthma with acute exacerbation, severe persistent    Pt comes in with cc of dib. Pt has acute resp failure, with  hypoxia. She is tripoding, tachypneic. Diffuse wheezing. PT feels she is getting tired, will start bipap and continue continuous nebs. Pt will also get mag.  LATE ENTRY: 90 min post bipap, we removed the setup. Pt instantly started getting short of breath and requested bipap. We will need to admit there. More treatment ordered. I reluctantly gave labetalol 10 mg ivp x 1 for her elevated HR and BP. She was given zopimex for her wheezing.  Pt will need step down admission.  CRITICAL CARE Performed by: Derwood Kaplan   Total critical care time: 48 minutes  Critical care time was exclusive of separately  billable procedures and treating other patients.  Critical care was necessary to treat or prevent imminent or life-threatening deterioration.  Critical care was time spent personally by me on the following activities: development of treatment plan with patient and/or surrogate as well as nursing, discussions with consultants, evaluation of patient's response to treatment, examination of patient, obtaining history from patient or surrogate, ordering and performing treatments and interventions, ordering and review of laboratory studies, ordering and review of radiographic studies, pulse oximetry and re-evaluation of patient's condition.    Derwood Kaplan, MD 10/02/15 0111

## 2015-10-03 LAB — CBC WITH DIFFERENTIAL/PLATELET
Basophils Absolute: 0 10*3/uL (ref 0.0–0.1)
Basophils Relative: 0 %
EOS ABS: 0 10*3/uL (ref 0.0–0.7)
EOS PCT: 0 %
HCT: 41.9 % (ref 36.0–46.0)
Hemoglobin: 13.6 g/dL (ref 12.0–15.0)
LYMPHS ABS: 1 10*3/uL (ref 0.7–4.0)
Lymphocytes Relative: 6 %
MCH: 32.5 pg (ref 26.0–34.0)
MCHC: 32.5 g/dL (ref 30.0–36.0)
MCV: 100.2 fL — ABNORMAL HIGH (ref 78.0–100.0)
MONOS PCT: 5 %
Monocytes Absolute: 0.8 10*3/uL (ref 0.1–1.0)
Neutro Abs: 15.4 10*3/uL — ABNORMAL HIGH (ref 1.7–7.7)
Neutrophils Relative %: 89 %
PLATELETS: 224 10*3/uL (ref 150–400)
RBC: 4.18 MIL/uL (ref 3.87–5.11)
RDW: 14.3 % (ref 11.5–15.5)
WBC: 17.2 10*3/uL — ABNORMAL HIGH (ref 4.0–10.5)

## 2015-10-03 LAB — COMPREHENSIVE METABOLIC PANEL
ALK PHOS: 47 U/L (ref 38–126)
ALT: 22 U/L (ref 14–54)
AST: 28 U/L (ref 15–41)
Albumin: 3.2 g/dL — ABNORMAL LOW (ref 3.5–5.0)
Anion gap: 9 (ref 5–15)
BUN: 19 mg/dL (ref 6–20)
CALCIUM: 8.3 mg/dL — AB (ref 8.9–10.3)
CO2: 21 mmol/L — AB (ref 22–32)
CREATININE: 0.84 mg/dL (ref 0.44–1.00)
Chloride: 108 mmol/L (ref 101–111)
Glucose, Bld: 149 mg/dL — ABNORMAL HIGH (ref 65–99)
Potassium: 4.3 mmol/L (ref 3.5–5.1)
SODIUM: 138 mmol/L (ref 135–145)
Total Bilirubin: 0.2 mg/dL — ABNORMAL LOW (ref 0.3–1.2)
Total Protein: 6.3 g/dL — ABNORMAL LOW (ref 6.5–8.1)

## 2015-10-03 LAB — HEMOGLOBIN A1C
Hgb A1c MFr Bld: 5.5 % (ref 4.8–5.6)
Mean Plasma Glucose: 111 mg/dL

## 2015-10-03 LAB — GLUCOSE, CAPILLARY: Glucose-Capillary: 146 mg/dL — ABNORMAL HIGH (ref 65–99)

## 2015-10-03 MED ORDER — INSULIN ASPART 100 UNIT/ML ~~LOC~~ SOLN
0.0000 [IU] | Freq: Three times a day (TID) | SUBCUTANEOUS | Status: DC
  Administered 2015-10-03: 2 [IU] via SUBCUTANEOUS
  Administered 2015-10-04 (×2): 1 [IU] via SUBCUTANEOUS
  Administered 2015-10-05: 3 [IU] via SUBCUTANEOUS
  Administered 2015-10-05: 2 [IU] via SUBCUTANEOUS

## 2015-10-03 MED ORDER — PANTOPRAZOLE SODIUM 40 MG PO TBEC
40.0000 mg | DELAYED_RELEASE_TABLET | Freq: Every day | ORAL | Status: DC
  Administered 2015-10-03 – 2015-10-06 (×4): 40 mg via ORAL
  Filled 2015-10-03 (×4): qty 1

## 2015-10-03 MED ORDER — METHYLPREDNISOLONE SODIUM SUCC 125 MG IJ SOLR
60.0000 mg | Freq: Two times a day (BID) | INTRAMUSCULAR | Status: DC
  Administered 2015-10-04: 60 mg via INTRAVENOUS
  Filled 2015-10-03 (×2): qty 2

## 2015-10-03 MED ORDER — FAMOTIDINE IN NACL 20-0.9 MG/50ML-% IV SOLN
20.0000 mg | Freq: Once | INTRAVENOUS | Status: AC
  Administered 2015-10-03: 20 mg via INTRAVENOUS
  Filled 2015-10-03: qty 50

## 2015-10-03 MED ORDER — SODIUM CHLORIDE 0.9 % IV SOLN: Freq: Once | INTRAVENOUS | Status: AC

## 2015-10-03 MED ORDER — IPRATROPIUM-ALBUTEROL 0.5-2.5 (3) MG/3ML IN SOLN
3.0000 mL | RESPIRATORY_TRACT | Status: DC | PRN
  Administered 2015-10-03: 3 mL via RESPIRATORY_TRACT
  Filled 2015-10-03: qty 3

## 2015-10-03 MED ORDER — AMLODIPINE BESYLATE 5 MG PO TABS
5.0000 mg | ORAL_TABLET | Freq: Every day | ORAL | Status: DC
  Administered 2015-10-03 – 2015-10-06 (×4): 5 mg via ORAL
  Filled 2015-10-03 (×4): qty 1

## 2015-10-03 MED ORDER — DIPHENHYDRAMINE HCL 50 MG/ML IJ SOLN
25.0000 mg | Freq: Once | INTRAMUSCULAR | Status: AC
  Administered 2015-10-03: 25 mg via INTRAVENOUS

## 2015-10-03 MED ORDER — ALBUTEROL SULFATE (2.5 MG/3ML) 0.083% IN NEBU
5.0000 mg | INHALATION_SOLUTION | Freq: Four times a day (QID) | RESPIRATORY_TRACT | Status: DC
Start: 2015-10-03 — End: 2015-10-03

## 2015-10-03 MED ORDER — METHYLPREDNISOLONE SODIUM SUCC 125 MG IJ SOLR
120.0000 mg | Freq: Once | INTRAMUSCULAR | Status: AC
  Administered 2015-10-03: 120 mg via INTRAVENOUS

## 2015-10-03 NOTE — Progress Notes (Signed)
PHARMACIST - PHYSICIAN COMMUNICATION Key Points: Use following P&T approved IV to PO antibiotic change policy. Description contains the criteria that are approved Note: Policy Excludes:  Esophagectomy patients  DR:   [facility] CONCERNING: IV to Oral Route Change Policy  RECOMMENDATION: This patient is receiving pantoprazole by the intravenous route.  Based on criteria approved by the Pharmacy and Therapeutics Committee, the intravenous medication(s) is/are being converted to the equivalent oral dose form(s).   DESCRIPTION: These criteria include:  The patient is eating (either orally or via tube) and/or has been taking other orally administered medications for a least 24 hours  The patient has no evidence of active gastrointestinal bleeding or impaired GI absorption (gastrectomy, short bowel, patient on TNA or NPO).  If you have questions about this conversion, please contact the Pharmacy Department    [phone] )  Jeani Hawking   [phone] )  [facility]   [phone] )  [facility]   [phone] )  [facility]   [phone] )  [facility]   Juliette Alcide [place], [facility] 10/03/2015 1:12 PM

## 2015-10-03 NOTE — Progress Notes (Signed)
Pt alert x 4, was given Dilaudid  IV for body pain and weakness, around 1935. One hour after IV med's were given.The Pt started having c/o throat tightness, and hands welling. Her face became flush,  No abnormalities noted to her throat area. She  had been previously placed on BiPAP, order for Pepside IV and IV Benadryl . O2 remained in normal range 94 % and up, RR 16. Writer at the bedside, Consulting civil engineer at the bedside. The Pt had immediate relief from the med's, she is now sitting up in her bed texting and watching TV. Will continue to monitor.

## 2015-10-03 NOTE — Progress Notes (Signed)
Patient ID: Laura Clay, female   DOB: May 12, 1971, 45 y.o.   MRN: [id number] [facility] PROGRESS NOTE  Clotilda Hafer XBJ:[id number] DOB: 08/06/1971 DOA: 10/27/15 PCP: Elvina Sidle, MD  Brief narrative:    45 y.o. female with a past medical history of asthma presented with dyspnea.   In ED, pt intially required BiPAP. She was found to have influenza A on this admission and is treated with tamiflu.   Assessment/Plan:    Principal Problem: Influenza A /  Leukocytosis  - Required BiPAP and just got off of BiPAP this am - CXR showed no acute cardiopulmonary findings - Continue Tamiflu for influenza A - Leukocytosis likely due to solumedrol so will taper down from Q 6 hours to Q 12 hours   Active Problems: Acute respiratory failure with hypoxia / Status asthmaticus - CXR showed no acute cardiopulmonary findings - Continue oxygen support via [place] to keep O2 saturation above 90% - Adjusted nebs to duoneb every 6 hours scheduled and es needed every 4 hours for shortness of breath or wheezing  - Continue solumedrol but taper down from Q 6 hours to Q 12 hours   HTN-ive urgency / Essential hypertension - BP 155/111 - Start Norvasc 5 mg daily - May use as needed hydralazine for BP above 150 /90  Steroid induced hyperglycemia  - CBG's in past 24 hours: 145, 121, 146 - We are tapering steroids so hopefully CBG's will stabilize - Use SSI for now    DVT Prophylaxis  - Lovenox subQ ordered    Code Status: Full.  Family Communication:  plan of care discussed with the patient Disposition Plan: in SDU since she just got off of BiPAP  IV access:  Peripheral IV  Procedures and diagnostic studies:    Dg Chest Port 1 View October 27, 2015  No edema or consolidation. Electronically Signed   By: Bretta Bang III M.D.   On: Oct 27, 2015 18:12   Medical Consultants:  None  IAnti-Infectives:   Tamiflu 26-Oct-2022 -->   Manson Passey, MD  [facility] Pager [phone]  Time spent in  minutes: 25 minutes  If 7PM-7AM, please contact night-coverage [email or web address] Password [email or web address] 10/03/2015, 10:07 AM   LOS: 2 days    HPI/Subjective: No acute overnight events. Patient reports feeling little better,jsut got off of BiPAP.  Objective: Filed Vitals:   10/03/15 0400 10/03/15 0739 10/03/15 0746 10/03/15 0802  BP: 140/97   153/103  Pulse: 91   104  Temp:   98.1 F (36.7 C)   TempSrc:      Resp: 19   21  Height:      Weight:      SpO2: 97% 97% 97% 96%   No intake or output data in the 24 hours ending 10/03/15 1007  Exam:   General:  Pt is alert, follows commands appropriately, not in acute distress  Cardiovascular: Regular rate and rhythm, S1/S2 appreciated   Respiratory: diminished, rhonchorous   Abdomen: Soft, non tender, non distended, bowel sounds present  Extremities: No edema, pulses palpable bilaterally  Neuro: Grossly nonfocal  Data Reviewed: Basic Metabolic Panel:  Recent Labs Lab Oct 27, 2015 1810 27-Oct-2015 1824 10/02/15 0325 10/03/15 0325  NA  --  139 135 138  K  --  3.6 4.2 4.3  CL  --  104 105 108  CO2  --   --  19* 21*  GLUCOSE  --  184* 163* 149*  BUN  --  CREATININE  --  0.70 0.72 0.84  CALCIUM  --   --  8.1* 8.3*  MG 1.8  --   --   --   PHOS 2.9  --   --   --    Liver Function Tests:  Recent Labs Lab 10/02/15 0325 10/03/15 0325  AST 29 28  ALT 25 22  ALKPHOS 54 47  BILITOT 0.4 0.2*  PROT 6.8 6.3*  ALBUMIN 3.7 3.2*   No results for input(s): LIPASE, AMYLASE in the last 168 hours. No results for input(s): AMMONIA in the last 168 hours. CBC:  Recent Labs Lab 10/01/15 1816 10/01/15 1824 10/02/15 0325 10/03/15 0325  WBC 15.4*  --  13.8* 17.2*  NEUTROABS 13.9*  --  12.8* 15.4*  HGB 15.0 16.0* 13.7 13.6  HCT 43.0 47.0* 40.8 41.9  MCV 96.6  --  98.8 100.2*  PLT 219  --  223 224   Cardiac Enzymes: No results for input(s): CKTOTAL, CKMB, CKMBINDEX, TROPONINI in the last 168 hours. BNP: Invalid input(s):  POCBNP CBG:  Recent Labs Lab 10/02/15 0749 10/02/15 1236 10/03/15 0612  GLUCAP 145* 121* 146*    Recent Results (from the past 240 hour(s))  MRSA PCR Screening     Status: None   Collection Time: 10/02/15 12:26 AM  Result Value Ref Range Status   MRSA by PCR NEGATIVE NEGATIVE Final      Scheduled Meds: . albuterol  10 mg/hr Nebulization Once  . diphenhydrAMINE  25 mg Oral QHS  . enoxaparin (LOVENOX) injection  40 mg Subcutaneous Daily  . guaiFENesin  600 mg Oral BID  . ipratropium  0.5 mg Nebulization Once  . ipratropium-albuterol  3 mL Nebulization Q6H  . methylPREDNISolone (SOLU-MEDROL) injection  60 mg Intravenous Q6H  . oseltamivir  75 mg Oral BID  . pantoprazole (PROTONIX) IV  40 mg Intravenous Q24H  . valACYclovir  1,000 mg Oral Daily

## 2015-10-04 DIAGNOSIS — J101 Influenza due to other identified influenza virus with other respiratory manifestations: Principal | ICD-10-CM

## 2015-10-04 DIAGNOSIS — I10 Essential (primary) hypertension: Secondary | ICD-10-CM

## 2015-10-04 LAB — COMPREHENSIVE METABOLIC PANEL
ALBUMIN: 3.2 g/dL — AB (ref 3.5–5.0)
ALK PHOS: 43 U/L (ref 38–126)
ALT: 37 U/L (ref 14–54)
ANION GAP: 10 (ref 5–15)
AST: 26 U/L (ref 15–41)
BUN: 18 mg/dL (ref 6–20)
CALCIUM: 8.3 mg/dL — AB (ref 8.9–10.3)
CO2: 23 mmol/L (ref 22–32)
Chloride: 105 mmol/L (ref 101–111)
Creatinine, Ser: 0.6 mg/dL (ref 0.44–1.00)
GFR calc non Af Amer: >60 mL/min (ref >60)
GLUCOSE: 149 mg/dL — AB (ref 65–99)
POTASSIUM: 3.8 mmol/L (ref 3.5–5.1)
Sodium: 138 mmol/L (ref 135–145)
Total Bilirubin: 0.5 mg/dL (ref 0.3–1.2)
Total Protein: 6.3 g/dL — ABNORMAL LOW (ref 6.5–8.1)

## 2015-10-04 LAB — CBC WITH DIFFERENTIAL/PLATELET
BASOS ABS: 0 10*3/uL (ref 0.0–0.1)
Basophils Relative: 0 %
EOS PCT: 0 %
Eosinophils Absolute: 0 10*3/uL (ref 0.0–0.7)
HCT: 41.4 % (ref 36.0–46.0)
Hemoglobin: 13.6 g/dL (ref 12.0–15.0)
LYMPHS ABS: 0.8 10*3/uL (ref 0.7–4.0)
LYMPHS PCT: 6 %
MCH: 33.1 pg (ref 26.0–34.0)
MCHC: 32.9 g/dL (ref 30.0–36.0)
MCV: 100.7 fL — AB (ref 78.0–100.0)
MONO ABS: 0.4 10*3/uL (ref 0.1–1.0)
Monocytes Relative: 3 %
Neutro Abs: 12.1 10*3/uL — ABNORMAL HIGH (ref 1.7–7.7)
Neutrophils Relative %: 91 %
PLATELETS: 214 10*3/uL (ref 150–400)
RBC: 4.11 MIL/uL (ref 3.87–5.11)
RDW: 14.1 % (ref 11.5–15.5)
WBC: 13.2 10*3/uL — ABNORMAL HIGH (ref 4.0–10.5)

## 2015-10-04 LAB — GLUCOSE, CAPILLARY
GLUCOSE-CAPILLARY: 112 mg/dL — AB (ref 65–99)
GLUCOSE-CAPILLARY: 142 mg/dL — AB (ref 65–99)
Glucose-Capillary: 155 mg/dL — ABNORMAL HIGH (ref 65–99)

## 2015-10-04 MED ORDER — METHYLPREDNISOLONE SODIUM SUCC 125 MG IJ SOLR
60.0000 mg | Freq: Four times a day (QID) | INTRAMUSCULAR | Status: DC
  Administered 2015-10-04 – 2015-10-05 (×4): 60 mg via INTRAVENOUS
  Filled 2015-10-04 (×4): qty 2

## 2015-10-04 MED ORDER — LORAZEPAM 1 MG PO TABS
1.0000 mg | ORAL_TABLET | Freq: Once | ORAL | Status: AC
  Administered 2015-10-04: 1 mg via ORAL
  Filled 2015-10-04: qty 1

## 2015-10-04 MED ORDER — HYDRALAZINE HCL 10 MG PO TABS
10.0000 mg | ORAL_TABLET | Freq: Three times a day (TID) | ORAL | Status: DC
  Administered 2015-10-04 – 2015-10-05 (×3): 10 mg via ORAL
  Filled 2015-10-04 (×3): qty 1

## 2015-10-04 MED ORDER — POLYETHYLENE GLYCOL 3350 17 G PO PACK
17.0000 g | PACK | Freq: Every day | ORAL | Status: DC
  Administered 2015-10-04 – 2015-10-05 (×2): 17 g via ORAL
  Filled 2015-10-04 (×3): qty 1

## 2015-10-04 MED ORDER — MENTHOL 3 MG MT LOZG
1.0000 | LOZENGE | OROMUCOSAL | Status: DC | PRN
  Administered 2015-10-04: 3 mg via ORAL
  Filled 2015-10-04: qty 9

## 2015-10-04 MED ORDER — HYDRALAZINE HCL 20 MG/ML IJ SOLN
20.0000 mg | Freq: Four times a day (QID) | INTRAMUSCULAR | Status: DC | PRN
  Administered 2015-10-04: 10 mg via INTRAVENOUS
  Administered 2015-10-05: 20 mg via INTRAVENOUS
  Filled 2015-10-04: qty 1

## 2015-10-04 NOTE — Progress Notes (Signed)
Arrived to floor for 0400 BiPAP check when Pt's RN notified me that about an hour ago the Pt had a panic attack and was no longer able to tolerate the BiPAP.   At this time all vital signs normal: HR - 85, RR - 14, SpO2 - 99% on 4 Lpm nasal cannula.  No respiratory distress at this time.  RT will continue to monitor as needed.

## 2015-10-04 NOTE — Progress Notes (Signed)
Patient ID: Laura Clay, female   DOB: 09/10/1970, 45 y.o.   MRN: [id number] [facility] PROGRESS NOTE  Laura Clay LKG:[id number] DOB: August 04, 1971 DOA: Oct 19, 2015 PCP: Elvina Sidle, MD  Brief narrative:    45 y.o. female with a past medical history of asthma presented with dyspnea.   In ED, pt intially required BiPAP. She was found to have influenza A on this admission and is treated with tamiflu.   Assessment/Plan:    Principal Problem: Influenza A /  Leukocytosis  - has been off BiPAp in 48 hours  - CXR showed no acute cardiopulmonary findings - Continue Tamiflu for influenza A - Leukocytosis likely due to solumedrol, unfortunately pt with more wheezing this AM and will require slower tapering that initially thought  - will keep in SDU for 24 hours more hours until pt bit more comfortable from resp stand point   Active Problems: Acute respiratory failure with hypoxia / Status asthmaticus in the setting of influenza A - CXR showed no acute cardiopulmonary findings - Continue oxygen support via [place] to keep O2 saturation above 90% - Adjusted nebs to duoneb every 6 hours scheduled and es needed every 4 hours for shortness of breath or wheezing  - Continue solumedrol but taper increase frequency to Q6 hours until less wheezing on exam   HTN-ive urgency / Essential hypertension - continue Norvasc 5 mg daily - continue to use as needed hydralazine for BP above 150 /90  Steroid induced hyperglycemia  - Use SSI for now   DVT Prophylaxis  - Lovenox subQ ordered    Code Status: Full.  Family Communication:  plan of care discussed with the patient Disposition Plan: in SDU since still significant wheezing   IV access:  Peripheral IV  Procedures and diagnostic studies:    Dg Chest Port 1 View Oct 19, 2015  No edema or consolidation. Electronically Signed   By: Bretta Bang III M.D.   On: 2015/10/19 18:12   Medical Consultants:  None  IAnti-Infectives:   Tamiflu Oct 18, 2022  -->  Debbora Presto, MD  [facility] Pager [phone]  Time spent in minutes: 25 minutes  If 7PM-7AM, please contact night-coverage [email or web address] Password [email or web address] 10/04/2015, 12:22 PM   LOS: 3 days    HPI/Subjective: No acute overnight events. Patient reports feeling more short of breath and wheezing.   Objective: Filed Vitals:   10/04/15 0700 10/04/15 0800 10/04/15 0900 10/04/15 1110  BP:  145/75  149/112  Pulse: 84 80 95   Temp:   99 F (37.2 C)   TempSrc:   Oral   Resp: Height:      Weight:      SpO2: 98% 99% 98%     Intake/Output Summary (Last 24 hours) at 10/04/15 1222 Last data filed at 10/04/15 1100  Gross per 24 hour  Intake    570 ml  Output    800 ml  Net   -230 ml    Exam:   General:  Pt is alert, follows commands appropriately, not in acute distress  Cardiovascular: Regular rate and rhythm, S1/S2 appreciated   Respiratory: course breath sounds bilaterally with inspiratory and expiratory wheezing throughout   Abdomen: Soft, non tender, non distended, bowel sounds present  Extremities: No edema, pulses palpable bilaterally  Neuro: Grossly nonfocal  Data Reviewed: Basic Metabolic Panel:  Recent Labs Lab October 19, 2015 1810 19-Oct-2015 1824 10/02/15 0325 10/03/15 0325 10/04/15 0333  NA  --  139 135 138 138  K  --  3.6 4.2 4.3 3.8  CL  --  104 105 108 105  CO2  --   --  19* 21* 23  GLUCOSE  --  184* 163* 149* 149*  BUN  --  CREATININE  --  0.70 0.72 0.84 0.60  CALCIUM  --   --  8.1* 8.3* 8.3*  MG 1.8  --   --   --   --   PHOS 2.9  --   --   --   --    Liver Function Tests:  Recent Labs Lab 10/02/15 0325 10/03/15 0325 10/04/15 0333  AST ALT 25 22 37  ALKPHOS 54 47 43  BILITOT 0.4 0.2* 0.5  PROT 6.8 6.3* 6.3*  ALBUMIN 3.7 3.2* 3.2*   CBC:  Recent Labs Lab 10/01/15 1816 10/01/15 1824 10/02/15 0325 10/03/15 0325 10/04/15 0333  WBC 15.4*  --  13.8* 17.2* 13.2*  NEUTROABS 13.9*  --   12.8* 15.4* 12.1*  HGB 15.0 16.0* 13.7 13.6 13.6  HCT 43.0 47.0* 40.8 41.9 41.4  MCV 96.6  --  98.8 100.2* 100.7*  PLT 219  --  223 224 214   CBG:  Recent Labs Lab 10/02/15 0749 10/02/15 1236 10/03/15 0612 10/03/15 2220 10/04/15 0900  GLUCAP 145* 121* 146* 155* 112*    Recent Results (from the past 240 hour(s))  MRSA PCR Screening     Status: None   Collection Time: 10/02/15 12:26 AM  Result Value Ref Range Status   MRSA by PCR NEGATIVE NEGATIVE Final     Scheduled Meds: . albuterol  10 mg/hr Nebulization Once  . diphenhydrAMINE  25 mg Oral QHS  . enoxaparin (LOVENOX) injection  40 mg Subcutaneous Daily  . guaiFENesin  600 mg Oral BID  . ipratropium  0.5 mg Nebulization Once  . ipratropium-albuterol  3 mL Nebulization Q6H  . methylPREDNISolone (SOLU-MEDROL) injection  60 mg Intravenous Q6H  . oseltamivir  75 mg Oral BID  . pantoprazole (PROTONIX) IV  40 mg Intravenous Q24H  . valACYclovir  1,000 mg Oral Daily

## 2015-10-05 LAB — CBC WITH DIFFERENTIAL/PLATELET
BASOS ABS: 0 10*3/uL (ref 0.0–0.1)
Basophils Relative: 0 %
Eosinophils Absolute: 0 10*3/uL (ref 0.0–0.7)
Eosinophils Relative: 0 %
HEMATOCRIT: 41.7 % (ref 36.0–46.0)
Hemoglobin: 13.8 g/dL (ref 12.0–15.0)
LYMPHS PCT: 11 %
Lymphs Abs: 1 10*3/uL (ref 0.7–4.0)
MCH: 32.7 pg (ref 26.0–34.0)
MCHC: 33.1 g/dL (ref 30.0–36.0)
MCV: 98.8 fL (ref 78.0–100.0)
Monocytes Absolute: 0.5 10*3/uL (ref 0.1–1.0)
Monocytes Relative: 5 %
NEUTROS ABS: 7.4 10*3/uL (ref 1.7–7.7)
Neutrophils Relative %: 84 %
Platelets: 211 10*3/uL (ref 150–400)
RBC: 4.22 MIL/uL (ref 3.87–5.11)
RDW: 13.3 % (ref 11.5–15.5)
WBC: 8.8 10*3/uL (ref 4.0–10.5)

## 2015-10-05 LAB — BASIC METABOLIC PANEL
ANION GAP: 9 (ref 5–15)
BUN: 17 mg/dL (ref 6–20)
CO2: 26 mmol/L (ref 22–32)
Calcium: 8 mg/dL — ABNORMAL LOW (ref 8.9–10.3)
Chloride: 101 mmol/L (ref 101–111)
Creatinine, Ser: 0.7 mg/dL (ref 0.44–1.00)
GFR calc Af Amer: >60 mL/min (ref >60)
GFR calc non Af Amer: >60 mL/min (ref >60)
GLUCOSE: 142 mg/dL — AB (ref 65–99)
POTASSIUM: 3.9 mmol/L (ref 3.5–5.1)
Sodium: 136 mmol/L (ref 135–145)

## 2015-10-05 LAB — GLUCOSE, CAPILLARY
GLUCOSE-CAPILLARY: 179 mg/dL — AB (ref 65–99)
GLUCOSE-CAPILLARY: 132 mg/dL — AB (ref 65–99)
Glucose-Capillary: 116 mg/dL — ABNORMAL HIGH (ref 65–99)
Glucose-Capillary: 207 mg/dL — ABNORMAL HIGH (ref 65–99)

## 2015-10-05 MED ORDER — IPRATROPIUM-ALBUTEROL 0.5-2.5 (3) MG/3ML IN SOLN
3.0000 mL | Freq: Four times a day (QID) | RESPIRATORY_TRACT | Status: DC
  Administered 2015-10-05 – 2015-10-06 (×5): 3 mL via RESPIRATORY_TRACT
  Filled 2015-10-05 (×5): qty 3

## 2015-10-05 MED ORDER — PREDNISONE 50 MG PO TABS
50.0000 mg | ORAL_TABLET | Freq: Every day | ORAL | Status: DC
  Administered 2015-10-06: 50 mg via ORAL
  Filled 2015-10-05 (×2): qty 1

## 2015-10-05 MED ORDER — HYDRALAZINE HCL 25 MG PO TABS
25.0000 mg | ORAL_TABLET | Freq: Three times a day (TID) | ORAL | Status: DC
  Administered 2015-10-05 – 2015-10-06 (×2): 25 mg via ORAL
  Filled 2015-10-05 (×5): qty 1

## 2015-10-05 MED ORDER — LORAZEPAM 0.5 MG PO TABS
0.5000 mg | ORAL_TABLET | Freq: Once | ORAL | Status: AC
  Administered 2015-10-05: 0.5 mg via ORAL
  Filled 2015-10-05: qty 1

## 2015-10-05 NOTE — Progress Notes (Signed)
Patient ID: Laura Clay, female   DOB: Sep 01, 1970, 45 y.o.   MRN: [id number] [facility] PROGRESS NOTE  Hareem Surowiec WUJ:[id number] DOB: 1971-06-17 DOA: 2015-10-30 PCP: Elvina Sidle, MD  Brief narrative:    45 y.o. female with a past medical history of asthma presented with dyspnea.   In ED, pt intially required BiPAP. She was found to have influenza A on this admission and is treated with tamiflu.   Transfer to floor today.   Assessment/Plan:    Principal Problem: Influenza A / Leukocytosis  - Patient is stable for transfer to floor today - CXR showed no acute cardiopulmonary findings - We will continue Tamiflu - Stable respiratory status - Will change steroids to prednisone today   Active Problems: Acute respiratory failure with hypoxia / Status asthmaticus in the setting of influenza A - CXR on admission showed no acute cardiopulmonary findings - Stable respiratory status - Change Solu-Medrol to prednisone starting tomorrow - Continue oxygen support via nasal cannula to keep oxygen saturation above 90% - We will continue current nebulizer treatments  HTN-ive urgency / Essential hypertension - Blood pressure 137/95  - continue Norvasc daily  Steroid induced hyperglycemia  - Continue sliding scale insulin   DVT Prophylaxis  - Lovenox subQ ordered   Code Status: Full.  Family Communication: plan of care discussed with the patient Disposition Plan: Transfer to floor today    IV access:  Peripheral IV  Procedures and diagnostic studies:    Dg Chest Port 1 View 2015/10/30   No edema or consolidation.   Medical Consultants:  None   IAnti-Infectives:   Tamiflu 2022/10/29 -->   Manson Passey, MD  [facility] Pager [phone]  Time spent in minutes: 25 minutes  If 7PM-7AM, please contact night-coverage [email or web address] Password [email or web address] 10/05/2015, 11:23 AM   LOS: 4 days    HPI/Subjective: No acute overnight events. Patient reports she feels little  better.   Objective: Filed Vitals:   10/05/15 0800 10/05/15 0900 10/05/15 0905 10/05/15 1000  BP: 139/96  139/96 137/95  Pulse: 82 95 94 107  Temp: 98.5 F (36.9 C)     TempSrc: Oral     Resp: Height:      Weight:      SpO2: 98% 98% 96% 94%   No intake or output data in the 24 hours ending 10/05/15 1123  Exam:   General:  Pt is alert, follows commands appropriately, not in acute distress  Cardiovascular: Regular rate and rhythm, S1/S2 appreciated   Respiratory: Diminished bilaterally, no wheezing, no crackles, no rhonchi  Abdomen: Soft, non tender, non distended, bowel sounds present  Extremities: No edema, pulses palpable bilaterally  Neuro: Grossly nonfocal  Data Reviewed: Basic Metabolic Panel:  Recent Labs Lab 10/30/15 1810 2015-10-30 1824 10/02/15 0325 10/03/15 0325 10/04/15 0333 10/05/15 0333  NA  --  139 135 138 138 136  K  --  3.6 4.2 4.3 3.8 3.9  CL  --  104 105 108 105 101  CO2  --   --  19* 21* 23 26  GLUCOSE  --  184* 163* 149* 149* 142*  BUN  --  CREATININE  --  0.70 0.72 0.84 0.60 0.70  CALCIUM  --   --  8.1* 8.3* 8.3* 8.0*  MG 1.8  --   --   --   --   --   PHOS 2.9  --   --   --   --   --  Liver Function Tests:  Recent Labs Lab 10/02/15 0325 10/03/15 0325 10/04/15 0333  AST ALT 25 22 37  ALKPHOS 54 47 43  BILITOT 0.4 0.2* 0.5  PROT 6.8 6.3* 6.3*  ALBUMIN 3.7 3.2* 3.2*   No results for input(s): LIPASE, AMYLASE in the last 168 hours. No results for input(s): AMMONIA in the last 168 hours. CBC:  Recent Labs Lab 10/01/15 1816 10/01/15 1824 10/02/15 0325 10/03/15 0325 10/04/15 0333 10/05/15 0333  WBC 15.4*  --  13.8* 17.2* 13.2* 8.8  NEUTROABS 13.9*  --  12.8* 15.4* 12.1* 7.4  HGB 15.0 16.0* 13.7 13.6 13.6 13.8  HCT 43.0 47.0* 40.8 41.9 41.4 41.7  MCV 96.6  --  98.8 100.2* 100.7* 98.8  PLT 219  --  223 224 214 211   Cardiac Enzymes: No results for input(s): CKTOTAL, CKMB,  CKMBINDEX, TROPONINI in the last 168 hours. BNP: Invalid input(s): POCBNP CBG:  Recent Labs Lab 10/03/15 0612 10/03/15 2220 10/04/15 0900 10/04/15 1226 10/05/15 0858  GLUCAP 146* 155* 112* 142* 116*    Recent Results (from the past 240 hour(s))  MRSA PCR Screening     Status: None   Collection Time: 10/02/15 12:26 AM  Result Value Ref Range Status   MRSA by PCR NEGATIVE NEGATIVE Final     Scheduled Meds: . amLODipine  5 mg Oral Daily  . antiseptic oral rinse  7 mL Mouth Rinse q12n4p  . chlorhexidine  15 mL Mouth Rinse BID  . diphenhydrAMINE  25 mg Oral QHS  . enoxaparin (LOVENOX) injection  40 mg Subcutaneous Daily  . guaiFENesin  600 mg Oral BID  . hydrALAZINE  10 mg Oral 3 times per day  . insulin aspart  0-9 Units Subcutaneous TID WC  . ipratropium-albuterol  3 mL Nebulization Q6H WA  . methylPREDNISolone (SOLU-MEDROL) injection  60 mg Intravenous Q6H  . oseltamivir  75 mg Oral BID  . pantoprazole  40 mg Oral Daily  . polyethylene glycol  17 g Oral Daily  . valACYclovir  1,000 mg Oral Daily

## 2015-10-05 NOTE — Progress Notes (Signed)
Date: October 05, 2015 Chart reviewed for concurrent status and case management needs. Will continue to follow patient for changes and needs:  Transferred from sdu to acute care Marcelle Smiling, BSN, RN, [place]   [phone]

## 2015-10-06 LAB — CBC WITH DIFFERENTIAL/PLATELET
BASOS PCT: 0 %
Basophils Absolute: 0 10*3/uL (ref 0.0–0.1)
EOS ABS: 0 10*3/uL (ref 0.0–0.7)
Eosinophils Relative: 0 %
HCT: 42.9 % (ref 36.0–46.0)
Hemoglobin: 14 g/dL (ref 12.0–15.0)
LYMPHS ABS: 3.5 10*3/uL (ref 0.7–4.0)
Lymphocytes Relative: 36 %
MCH: 32.2 pg (ref 26.0–34.0)
MCHC: 32.6 g/dL (ref 30.0–36.0)
MCV: 98.6 fL (ref 78.0–100.0)
MONO ABS: 0.9 10*3/uL (ref 0.1–1.0)
Monocytes Relative: 9 %
NEUTROS PCT: 55 %
Neutro Abs: 5.3 10*3/uL (ref 1.7–7.7)
PLATELETS: 214 10*3/uL (ref 150–400)
RBC: 4.35 MIL/uL (ref 3.87–5.11)
RDW: 13 % (ref 11.5–15.5)
WBC: 9.7 10*3/uL (ref 4.0–10.5)

## 2015-10-06 LAB — GLUCOSE, CAPILLARY: GLUCOSE-CAPILLARY: 83 mg/dL (ref 65–99)

## 2015-10-06 MED ORDER — GUAIFENESIN-DM 100-10 MG/5ML PO SYRP: 5.0000 mL | ORAL_SOLUTION | ORAL | Status: DC | PRN

## 2015-10-06 MED ORDER — OSELTAMIVIR PHOSPHATE 75 MG PO CAPS: 75.0000 mg | ORAL_CAPSULE | Freq: Two times a day (BID) | ORAL | Status: DC

## 2015-10-06 NOTE — Discharge Summary (Signed)
Physician Discharge Summary  Laura Clay WUJ:[id number] DOB: February 22, 1971 DOA: 10/01/2015  PCP: Elvina Sidle, MD  Admit date: 10/01/2015 Discharge date: 10/06/2015  Recommendations for Outpatient Follow-up:  Continue Tamiflu for 1 more day for influenza A Please follow-up with primary care physician in about 2 weeks to recheck blood pressure.  Discharge Diagnoses:  Principal Problem:   Status asthmaticus Active Problems:   HTN (hypertension), accelerated    Depression with anxiety   Leukocytosis   Hyperglycemia   Influenza A    Discharge Condition: stable   Diet recommendation: as tolerated   History of present illness:  45 y.o. female with a past medical history of asthma presented with dyspnea.   In ED, pt intially required BiPAP. She was found to have influenza A on this admission and is treated with tamiflu.   Transferred to floor 2/27.   Hospital Course:   Assessment/Plan:    Principal Problem: Influenza A / Leukocytosis  - CXR showed no acute cardiopulmonary findings - Continue Tamiflu through 10/07/2015 - Stable respiratory status  Active Problems: Acute respiratory failure with hypoxia / Status asthmaticus in the setting of influenza A - CXR on admission showed no acute cardiopulmonary findings - Stable respiratory status - We'll stop steroids today - Continue inhalers per home regimen  HTN-ive urgency / Essential hypertension - SBP in 140's; likely due to acute infection. Can be followed up on outpatient basis per patient preference  Steroid induced hyperglycemia  - Received sliding scale insulin in hospital while on steroids  DVT Prophylaxis  - Lovenox subQ ordered in hospital   Code Status: Full.  Family Communication: plan of care discussed with the patient   IV access:  Peripheral IV  Procedures and diagnostic studies:   Dg Chest Port 1 View 10/01/2015 No edema or consolidation.   Medical Consultants:  None    IAnti-Infectives:   Tamiflu 2/23 --> 10/07/2015     Signed:  Manson Passey, MD  [facility] 10/06/2015, 10:18 AM  Pager #: [phone]  Time spent in minutes: more than 30 minutes  Discharge Exam: Filed Vitals:   10/05/15 2144 10/06/15 0519  BP: 167/113 149/102  Pulse: 98 86  Temp: 98.5 F (36.9 C) 98.6 F (37 C)  Resp: 19 19   Filed Vitals:   10/05/15 2144 10/06/15 0149 10/06/15 0519 10/06/15 0842  BP: 167/113  149/102   Pulse: 98  86   Temp: 98.5 F (36.9 C)  98.6 F (37 C)   TempSrc: Oral  Oral   Resp: 19  19   Height:      Weight:      SpO2: 93% 96% 95% 96%    General: Pt is alert, follows commands appropriately, not in acute distress Cardiovascular: Regular rate and rhythm, S1/S2 +, no murmurs Respiratory: congested, no rhonchi  Abdominal: Soft, non tender, non distended, bowel sounds +, no guarding Extremities: no edema, no cyanosis, pulses palpable bilaterally DP and PT Neuro: Grossly nonfocal  Discharge Instructions  Discharge Instructions    Call MD for:  difficulty breathing, headache or visual disturbances    Complete by:  As directed      Call MD for:  persistant dizziness or light-headedness    Complete by:  As directed      Call MD for:  persistant nausea and vomiting    Complete by:  As directed      Call MD for:  severe uncontrolled pain    Complete by:  As directed  Diet - low sodium heart healthy    Complete by:  As directed      Discharge instructions    Complete by:  As directed   Continue Tamiflu for 1 more day for influenza A Please follow-up with primary care physician in about 2 weeks to recheck blood pressure.     Increase activity slowly    Complete by:  As directed             Medication List    STOP taking these medications        dextromethorphan-guaiFENesin 30-600 MG 12hr tablet  Commonly known as:  MUCINEX DM  Replaced by:  guaiFENesin-dextromethorphan 100-10 MG/5ML syrup     predniSONE 10 MG  tablet  Commonly known as:  DELTASONE      TAKE these medications        albuterol 108 (90 Base) MCG/ACT inhaler  Commonly known as:  PROVENTIL HFA;VENTOLIN HFA  Inhale 2 puffs into the lungs every 6 (six) hours as needed for wheezing or shortness of breath.     albuterol (2.5 MG/3ML) 0.083% nebulizer solution  Commonly known as:  PROVENTIL  Take 3 mLs (2.5 mg total) by nebulization every 4 (four) hours as needed for wheezing or shortness of breath.     BENADRYL 25 MG tablet  Generic drug:  diphenhydrAMINE  Take 25 mg by mouth at bedtime.     guaiFENesin-dextromethorphan 100-10 MG/5ML syrup  Commonly known as:  ROBITUSSIN DM  Take 5 mLs by mouth every 4 (four) hours as needed for cough.     ibuprofen 200 MG tablet  Commonly known as:  ADVIL,MOTRIN  Take 600 mg by mouth every 6 (six) hours as needed for headache.     mometasone-formoterol 200-5 MCG/ACT Aero  Commonly known as:  DULERA  Inhale 2 puffs into the lungs 2 (two) times daily.     omeprazole 20 MG capsule  Commonly known as:  PRILOSEC  Take 20 mg by mouth 2 (two) times daily.     oseltamivir 75 MG capsule  Commonly known as:  TAMIFLU  Take 1 capsule (75 mg total) by mouth 2 (two) times daily.     VALTREX 1000 MG tablet  Generic drug:  valACYclovir  Take 1,000 mg by mouth daily.           Follow-up Information    Follow up with Elvina Sidle, MD. Schedule an appointment as soon as possible for a visit in 2 weeks.   Specialty:  Family Medicine   Why:  Follow up appt after recent hospitalization   Contact information:   [employer] [facility] [id number] [facility] [address]   [place]  [place] [phone]        The results of significant diagnostics from this hospitalization (including imaging, microbiology, ancillary and laboratory) are listed below for reference.    Significant Diagnostic Studies: Dg Chest Port 1 View  10/01/2015  CLINICAL DATA:  Tachycardia and hypertension.  Wheezing EXAM:  PORTABLE CHEST 1 VIEW COMPARISON:  August 22, 2015 FINDINGS: No edema or consolidation. Heart size and pulmonary vascularity are normal. No pneumothorax. No adenopathy. No bone lesions. IMPRESSION: No edema or consolidation. Electronically Signed   By: Bretta Bang III M.D.   On: 10/01/2015 18:12    Microbiology: Recent Results (from the past 240 hour(s))  MRSA PCR Screening     Status: None   Collection Time: 10/02/15 12:26 AM  Result Value Ref Range Status   MRSA by PCR NEGATIVE NEGATIVE  Final    Comment:        The GeneXpert MRSA Assay (FDA approved for NASAL specimens only), is one component of a comprehensive MRSA colonization surveillance program. It is not intended to diagnose MRSA infection nor to guide or monitor treatment for MRSA infections.      Labs: Basic Metabolic Panel:  Recent Labs Lab 10/01/15 1810 10/01/15 1824 10/02/15 0325 10/03/15 0325 10/04/15 0333 10/05/15 0333  NA  --  139 135 138 138 136  K  --  3.6 4.2 4.3 3.8 3.9  CL  --  104 105 108 105 101  CO2  --   --  19* 21* 23 26  GLUCOSE  --  184* 163* 149* 149* 142*  BUN  --  CREATININE  --  0.70 0.72 0.84 0.60 0.70  CALCIUM  --   --  8.1* 8.3* 8.3* 8.0*  MG 1.8  --   --   --   --   --   PHOS 2.9  --   --   --   --   --    Liver Function Tests:  Recent Labs Lab 10/02/15 0325 10/03/15 0325 10/04/15 0333  AST ALT 25 22 37  ALKPHOS 54 47 43  BILITOT 0.4 0.2* 0.5  PROT 6.8 6.3* 6.3*  ALBUMIN 3.7 3.2* 3.2*   No results for input(s): LIPASE, AMYLASE in the last 168 hours. No results for input(s): AMMONIA in the last 168 hours. CBC:  Recent Labs Lab 10/02/15 0325 10/03/15 0325 10/04/15 0333 10/05/15 0333 10/06/15 0457  WBC 13.8* 17.2* 13.2* 8.8 9.7  NEUTROABS 12.8* 15.4* 12.1* 7.4 5.3  HGB 13.7 13.6 13.6 13.8 14.0  HCT 40.8 41.9 41.4 41.7 42.9  MCV 98.8 100.2* 100.7* 98.8 98.6  PLT 223 224 214 211 214   Cardiac Enzymes: No results for input(s):  CKTOTAL, CKMB, CKMBINDEX, TROPONINI in the last 168 hours. BNP: BNP (last 3 results) No results for input(s): BNP in the last 8760 hours.  ProBNP (last 3 results) No results for input(s): PROBNP in the last 8760 hours.  CBG:  Recent Labs Lab 10/05/15 0858 10/05/15 1227 10/05/15 1621 10/05/15 2144 10/06/15 0747  GLUCAP 116* 207* 179* 132* 83

## 2015-10-06 NOTE — Discharge Instructions (Signed)

## 2015-10-06 NOTE — Care Management Note (Signed)
Case Management Note  Patient Details  Name: Fajr Fife MRN: [id number] Date of Birth: 1971/02/24  Subjective/Objective:    Admitted history of asthma presented with dyspnea                Action/Plan: Discharge planning, spoke with patient at bedside. Has PCP to follow up with. No anticipated d/c needs. Able to get medications at [facility].   Expected Discharge Date:   (unknown)               Expected Discharge Plan:  Home/Self Care  In-House Referral:  NA  Discharge planning Services  CM Consult  Post Acute Care Choice:  NA Choice offered to:  NA  DME Arranged:  N/A DME Agency:  NA  HH Arranged:  NA HH Agency:  NA  Status of Service:  Completed, signed off  Medicare Important Message Given:    Date Medicare IM Given:    Medicare IM give by:    Date Additional Medicare IM Given:    Additional Medicare Important Message give by:     If discussed at Long Length of Stay Meetings, dates discussed:    Additional Comments:  Alexis Goodell, RN 10/06/2015, 11:07 AM

## 2015-10-07 LAB — GLUCOSE, CAPILLARY
GLUCOSE-CAPILLARY: 119 mg/dL — AB (ref 65–99)
GLUCOSE-CAPILLARY: 164 mg/dL — AB (ref 65–99)
Glucose-Capillary: 99 mg/dL (ref 65–99)
Glucose-Capillary: 138 mg/dL — ABNORMAL HIGH (ref 65–99)

## 2015-12-02 ENCOUNTER — Ambulatory Visit: Payer: Self-pay | Admitting: Allergy and Immunology

## 2015-12-08 ENCOUNTER — Ambulatory Visit: Payer: Self-pay | Admitting: Allergy and Immunology

## 2015-12-22 ENCOUNTER — Encounter: Payer: Self-pay | Admitting: Allergy and Immunology

## 2015-12-22 ENCOUNTER — Ambulatory Visit (INDEPENDENT_AMBULATORY_CARE_PROVIDER_SITE_OTHER): Payer: Self-pay | Admitting: Allergy and Immunology

## 2015-12-22 VITALS — BP 128/84 | HR 64 | Resp 16 | Ht 63.9 in | Wt 160.8 lb

## 2015-12-22 DIAGNOSIS — J4551 Severe persistent asthma with (acute) exacerbation: Secondary | ICD-10-CM | POA: Diagnosis not present

## 2015-12-22 DIAGNOSIS — J309 Allergic rhinitis, unspecified: Secondary | ICD-10-CM | POA: Diagnosis not present

## 2015-12-22 DIAGNOSIS — H1010 Acute atopic conjunctivitis, unspecified eye: Secondary | ICD-10-CM

## 2015-12-22 DIAGNOSIS — Z8709 Personal history of other diseases of the respiratory system: Secondary | ICD-10-CM

## 2015-12-22 DIAGNOSIS — K219 Gastro-esophageal reflux disease without esophagitis: Secondary | ICD-10-CM | POA: Diagnosis not present

## 2015-12-22 MED ORDER — METHYLPREDNISOLONE ACETATE 80 MG/ML IJ SUSP
80.0000 mg | Freq: Once | INTRAMUSCULAR | Status: AC
  Administered 2015-12-22: 80 mg via INTRAMUSCULAR

## 2015-12-22 MED ORDER — ALBUTEROL SULFATE 108 (90 BASE) MCG/ACT IN AEPB: 2.0000 | INHALATION_SPRAY | RESPIRATORY_TRACT | Status: DC | PRN

## 2015-12-22 MED ORDER — MOMETASONE FURO-FORMOTEROL FUM 200-5 MCG/ACT IN AERO: 2.0000 | INHALATION_SPRAY | Freq: Two times a day (BID) | RESPIRATORY_TRACT | Status: DC

## 2015-12-22 NOTE — Progress Notes (Signed)
Follow-up Note  Referring Provider: Elvina Sidle, MD Primary Provider: Elvina Sidle, MD Date of Office Visit: 12/22/2015  Subjective:   Laura Clay (DOB: Sep 09, 1970) is a 45 y.o. female who [facility] in re-evaluation of the following:  HPI: Laura Clay presents this clinic in reevaluation of her asthma, allergic rhinitis, gastroesophageal reflux disease. I've not seen her in his clinic since October 2016.  Hayly states that in February 2017 she contracted influenza B and was admitted to the ICU with severe respiratory failure requiring BiPAP therapy.   Prior to that point in time she was doing relatively well without any significant asthma flareups requiring a systemic steroid and a bronchodilator requirement that was less than twice a week while consistently using her Dulera.   Since her discharge she unfortunately contracted a head cold about 2 weeks ago with nasal congestion and has developed coughing and wheezing and she is using her bronchodilator a few times per week. She was using it 3 times a day initially when this head cold was acute but now she has noted that her activity has gone down somewhat this past week. However, she still has nasal congestion and she still wheezes and coughs. She has not had any ugly nasal discharge or anosmia or headaches. She unfortunately is not using her [place] Sleight because she ran out of this medication several weeks ago. She consistently uses her Nasonex.  As well, and has had good control of her reflux while using omeprazole on a consistent basis.    Medication List           albuterol 108 (90 Base) MCG/ACT inhaler  Commonly known as:  PROVENTIL HFA;VENTOLIN HFA  Inhale 2 puffs into the lungs every 6 (six) hours as needed for wheezing or shortness of breath.     albuterol (2.5 MG/3ML) 0.083% nebulizer solution  Commonly known as:  PROVENTIL  Take 3 mLs (2.5 mg total) by nebulization every 4 (four)  hours as needed for wheezing or shortness of breath.     BENADRYL 25 MG tablet  Generic drug:  diphenhydrAMINE  Take 25 mg by mouth at bedtime.     ibuprofen 200 MG tablet  Commonly known as:  ADVIL,MOTRIN  Take 600 mg by mouth every 6 (six) hours as needed for headache.     mometasone 50 MCG/ACT nasal spray  Commonly known as:  NASONEX  Place 1 spray into the nose daily.     mometasone-formoterol 200-5 MCG/ACT Aero  Commonly known as:  DULERA  Inhale 2 puffs into the lungs 2 (two) times daily.     MUCINEX DM PO  Take by mouth 2 (two) times daily.     multivitamin tablet  Take 1 tablet by mouth daily.     omeprazole 20 MG capsule  Commonly known as:  PRILOSEC  Take 20 mg by mouth daily.     VALTREX 1000 MG tablet  Generic drug:  valACYclovir  Take 1,000 mg by mouth daily.        Past Medical History  Diagnosis Date  . Hypertension   . Asthma   . Anxiety   . Depression   . GERD (gastroesophageal reflux disease)   . Kidney stones     Past Surgical History  Procedure Laterality Date  . Ovarian cyst removal    . Bunionectomy Left   . Abdominal hysterectomy  2000    Abnormal PAPs    Allergies  Allergen Reactions  .  Dilaudid [Hydromorphone Hcl] Shortness Of Breath, Swelling and Other (See Comments)    Redness in hands and thorat.  . Morphine     REACTION: per pt gives her hives    Review of systems negative except as noted in HPI / PMHx or noted below:  Review of Systems  Constitutional: Negative.   HENT: Negative.   Eyes: Negative.   Respiratory: Negative.   Cardiovascular: Negative.   Gastrointestinal: Negative.   Genitourinary: Negative.   Musculoskeletal: Negative.   Skin: Negative.   Neurological: Negative.   Endo/Heme/Allergies: Negative.   Psychiatric/Behavioral: Negative.      Objective:   Filed Vitals:   12/22/15 1754  BP: 128/84  Pulse: 64  Resp: 16   Height: 5' 3.9" (162.3 cm)  Weight: 160 lb 12.8 oz (72.938 kg)   Physical  Exam  Constitutional: She is well-developed, well-nourished, and in no distress.  HENT:  Head: Normocephalic.  Right Ear: Tympanic membrane, external ear and ear canal normal.  Left Ear: Tympanic membrane, external ear and ear canal normal.  Nose: Mucosal edema present. No rhinorrhea.  Mouth/Throat: Uvula is midline, oropharynx is clear and moist and mucous membranes are normal. No oropharyngeal exudate.  Eyes: Conjunctivae are normal.  Neck: Trachea normal. No tracheal tenderness present. No tracheal deviation present. No thyromegaly present.  Cardiovascular: Normal rate, regular rhythm, S1 normal, S2 normal and normal heart sounds.   No murmur heard. Pulmonary/Chest: No stridor. No respiratory distress. She has wheezes. She has no rales.  Musculoskeletal: She exhibits no edema.  Lymphadenopathy:       Head (right side): No tonsillar adenopathy present.       Head (left side): No tonsillar adenopathy present.    She has no cervical adenopathy.  Neurological: She is alert. Gait normal.  Skin: No rash noted. She is not diaphoretic. No erythema. Nails show no clubbing.  Psychiatric: Mood and affect normal.    Diagnostics:    Spirometry was performed and demonstrated an FEV1 of 2.34 at 84 % of predicted.  The patient had an Asthma Control Test with the following results: ACT Total Score: 8.    Assessment and Plan:   1. Asthma, not well controlled, severe persistent, with acute exacerbation   2. Allergic rhinoconjunctivitis   3. Gastroesophageal reflux disease, esophagitis presence not specified   4. History of respiratory failure     1. Depomedrol 80 IM now  2. Continue Dulera 200 two inhalations two times per day - samples  3. Continue Nasonex or OTC Rhinocort (Coupon) one spray each nostril 1 time per day   4. Continue omeprazole 20 one tablet one time per day  5. Use ProAir Respiclick (Coupon) 2 inhalations every 2-4 hours or albuterol nebulization every 4-6 hours if  needed.  6. Return in 4 weeks or earlier if problem  [name] has significant inflammation of her respiratory tract and I've given her anti-inflammatory medications as noted above including a systemic steroid and we'll have her restart her Dulera. I gave her many samples of Dulera during today's visit. Given the fact that she had respiratory failure in February we need to be somewhat aggressive about treating her inflammation. She does have insurance at this point in time and it is somewhat easier for her to obtain medications. I looked at her labs from her recent admission in February and she did not appear to have any eosinophils and thus she is not a candidate for nucala. However, it should be noted that she had very  high doses of systemic steroids during that admission which probably drove her eosinophil count of 0. I will recheck her eosinophil count and also check an IgE level to see if she is a candidate for either nucala or Xolair with her next visit in 4 weeks.  Laurette Schimke, MD [facility]

## 2015-12-22 NOTE — Patient Instructions (Addendum)
  1. Depomedrol 80 IM now  2. Continue Dulera 200 two inhalations two times per day - samples  3. Continue Nasonex or OTC Rhinocort (Coupon) one spray each nostril 1 time per day   4. Continue omeprazole 20 one tablet one time per day  5. Use ProAir Respiclick (Coupon) 2 inhalations every 4-6 hours or albuterol nebulization every 4-6 hours if needed.  6. Return in 4 weeks or earlier if problem

## 2016-01-19 ENCOUNTER — Ambulatory Visit: Payer: 59 | Admitting: Allergy and Immunology

## 2016-05-09 ENCOUNTER — Other Ambulatory Visit: Payer: Self-pay

## 2016-05-09 MED ORDER — MOMETASONE FURO-FORMOTEROL FUM 200-5 MCG/ACT IN AERO: 2.0000 | INHALATION_SPRAY | Freq: Two times a day (BID) | RESPIRATORY_TRACT | 5 refills | Status: DC

## 2016-05-11 ENCOUNTER — Other Ambulatory Visit: Payer: Self-pay | Admitting: *Deleted

## 2016-05-12 ENCOUNTER — Other Ambulatory Visit: Payer: Self-pay | Admitting: Allergy and Immunology

## 2016-05-17 ENCOUNTER — Other Ambulatory Visit: Payer: Self-pay | Admitting: *Deleted

## 2016-05-17 MED ORDER — ALBUTEROL SULFATE HFA 108 (90 BASE) MCG/ACT IN AERS: 2.0000 | INHALATION_SPRAY | RESPIRATORY_TRACT | 1 refills | Status: DC | PRN

## 2016-06-13 ENCOUNTER — Other Ambulatory Visit: Payer: Self-pay | Admitting: Allergy and Immunology

## 2016-06-27 ENCOUNTER — Other Ambulatory Visit: Payer: Self-pay | Admitting: Allergy and Immunology

## 2016-06-27 ENCOUNTER — Telehealth: Payer: Self-pay | Admitting: Allergy and Immunology

## 2016-06-27 NOTE — Telephone Encounter (Signed)
Refilled already approved and sent into pharmacy.

## 2016-06-27 NOTE — Telephone Encounter (Signed)
Patient called and said she only wanted to talk to you. Said her asthma is not well controlled and she has eczema on her eyelid.

## 2016-06-27 NOTE — Telephone Encounter (Signed)
Pt called and needs albuterol nebulizer solution called into pharmacy .

## 2016-06-28 NOTE — Telephone Encounter (Signed)
Left message for patient to return phone call today.

## 2016-06-29 ENCOUNTER — Ambulatory Visit (INDEPENDENT_AMBULATORY_CARE_PROVIDER_SITE_OTHER): Payer: 59 | Admitting: Allergy and Immunology

## 2016-06-29 ENCOUNTER — Encounter: Payer: Self-pay | Admitting: Allergy and Immunology

## 2016-06-29 ENCOUNTER — Encounter (INDEPENDENT_AMBULATORY_CARE_PROVIDER_SITE_OTHER): Payer: Self-pay

## 2016-06-29 VITALS — BP 146/88 | HR 94 | Temp 98.9°F | Resp 18 | Ht 64.0 in | Wt 161.4 lb

## 2016-06-29 DIAGNOSIS — H1010 Acute atopic conjunctivitis, unspecified eye: Secondary | ICD-10-CM

## 2016-06-29 DIAGNOSIS — J4551 Severe persistent asthma with (acute) exacerbation: Secondary | ICD-10-CM

## 2016-06-29 DIAGNOSIS — J309 Allergic rhinitis, unspecified: Secondary | ICD-10-CM

## 2016-06-29 DIAGNOSIS — L308 Other specified dermatitis: Secondary | ICD-10-CM

## 2016-06-29 DIAGNOSIS — K219 Gastro-esophageal reflux disease without esophagitis: Secondary | ICD-10-CM | POA: Diagnosis not present

## 2016-06-29 DIAGNOSIS — Z8709 Personal history of other diseases of the respiratory system: Secondary | ICD-10-CM

## 2016-06-29 DIAGNOSIS — L989 Disorder of the skin and subcutaneous tissue, unspecified: Secondary | ICD-10-CM

## 2016-06-29 MED ORDER — METHYLPREDNISOLONE ACETATE 80 MG/ML IJ SUSP
80.0000 mg | Freq: Once | INTRAMUSCULAR | Status: AC
  Administered 2016-06-29: 80 mg via INTRAMUSCULAR

## 2016-06-29 MED ORDER — CLOCORTOLONE PIVALATE 0.1 % EX CREA: 1.0000 "application " | TOPICAL_CREAM | Freq: Two times a day (BID) | CUTANEOUS | 0 refills | Status: DC

## 2016-06-29 NOTE — Progress Notes (Signed)
Follow-up Note  Referring Provider: Elvina SidleLauenstein, Kurt, MD Primary Provider: Elvina SidleKurt Lauenstein, MD Date of Office Visit: 06/29/2016  Subjective:   Laura Clay (DOB: [birth year]) is a 45 y.o. female who [facility] in re-evaluation of the following:  HPI: Laura Clay presents to this clinic in evaluation of wheezing and coughing and using a bronchodilator multiple times per day and having to wake up at nighttime and use nebulized albuterol over the course of the past several months ever since she ran out of [place]. There is no obvious trigger giving rise to this issue. She's not had any fever or ugly sputum production or chest pain.  Her upper airways are doing relatively well. She has not required an antibiotic since I've last seen her in his clinic which was May 2017.  Her reflux is under very good control on her current medical therapy.  Laura Clay develops a rash around her eyes after using a moisturizer. She used a moisturizer for about 2 weeks and then developed this problem and she's discontinued this topical agent over the course of the past 2 weeks but still has somewhat swollen and scaly lesions around her eyes.    Medication List      albuterol 108 (90 Base) MCG/ACT inhaler Commonly known as:  PROAIR HFA Inhale 2 puffs into the lungs every 4 (four) hours as needed for wheezing or shortness of breath.   albuterol (2.5 MG/3ML) 0.083% nebulizer solution Commonly known as:  PROVENTIL USE ONE VIAL IN NEBULIZER EVERY 4 TO 6 HOURS AS NEEDED FOR COUGH OR WHEEZING   BENADRYL 25 MG tablet Generic drug:  diphenhydrAMINE Take 25 mg by mouth at bedtime.   ibuprofen 200 MG tablet Commonly known as:  ADVIL,MOTRIN Take 600 mg by mouth every 6 (six) hours as needed for headache.   mometasone 50 MCG/ACT nasal spray Commonly known as:  NASONEX Place 1 spray into the nose daily.   mometasone-formoterol 200-5 MCG/ACT Aero Commonly known as:   DULERA Inhale 2 puffs into the lungs 2 (two) times daily.   MUCINEX DM PO Take by mouth 2 (two) times daily.   multivitamin tablet Take 1 tablet by mouth daily.   omeprazole 20 MG capsule Commonly known as:  PRILOSEC Take 20 mg by mouth daily.   VALTREX 1000 MG tablet Generic drug:  valACYclovir Take 1,000 mg by mouth daily.       Past Medical History:  Diagnosis Date  . Anxiety   . Asthma   . Depression   . GERD (gastroesophageal reflux disease)   . Hypertension   . Kidney stones     Past Surgical History:  Procedure Laterality Date  . ABDOMINAL HYSTERECTOMY  2000   Abnormal PAPs  . BUNIONECTOMY Left   . OVARIAN CYST REMOVAL      Allergies  Allergen Reactions  . Dilaudid [Hydromorphone Hcl] Shortness Of Breath, Swelling and Other (See Comments)    Redness in hands and thorat.  . Morphine     REACTION: per pt gives her hives    Review of systems negative except as noted in HPI / PMHx or noted below:  Review of Systems  Constitutional: Negative.   HENT: Negative.   Eyes: Negative.   Respiratory: Negative.   Cardiovascular: Negative.   Gastrointestinal: Negative.   Genitourinary: Negative.   Musculoskeletal: Negative.   Skin: Negative.   Neurological: Negative.   Endo/Heme/Allergies: Negative.   Psychiatric/Behavioral: Negative.      Objective:  Vitals:   06/29/16 1053  BP: (!) 146/88  Pulse: 94  Resp: 18  Temp: 98.9 F (37.2 C)   Height: 5\' 4"  (162.6 cm)  Weight: 161 lb 6.4 oz (73.2 kg)   Physical Exam  Constitutional: She is well-developed, well-nourished, and in no distress.  HENT:  Head: Normocephalic.  Right Ear: Tympanic membrane, external ear and ear canal normal.  Left Ear: Tympanic membrane, external ear and ear canal normal.  Nose: Nose normal. No mucosal edema or rhinorrhea.  Mouth/Throat: Uvula is midline, oropharynx is clear and moist and mucous membranes are normal. No oropharyngeal exudate.  Eyes: Conjunctivae are  normal.  Neck: Trachea normal. No tracheal tenderness present. No tracheal deviation present. No thyromegaly present.  Cardiovascular: Normal rate, regular rhythm, S1 normal, S2 normal and normal heart sounds.   No murmur heard. Pulmonary/Chest: No stridor. No respiratory distress. She has wheezes (Inspiratory and expiratory wheezing in all lung fields). She has no rales.  Musculoskeletal: She exhibits no edema.  Lymphadenopathy:       Head (right side): No tonsillar adenopathy present.       Head (left side): No tonsillar adenopathy present.    She has no cervical adenopathy.  Neurological: She is alert. Gait normal.  Skin: Rash (Slight periorbital erythema and scale) noted. She is not diaphoretic. No erythema. Nails show no clubbing.  Psychiatric: Mood and affect normal.    Diagnostics:    Spirometry was performed and demonstrated an FEV1 of 2.07 at 75 % of predicted.  The patient had an Asthma Control Test with the following results: ACT Total Score: 7.    Assessment and Plan:   1. Asthma, not well controlled, severe persistent, with acute exacerbation   2. History of respiratory failure   3. Allergic rhinoconjunctivitis   4. Gastroesophageal reflux disease, esophagitis presence not specified   5. Inflammatory dermatosis     1. Depomedrol 80 IM now plus Prednisone 10mg  - four tabs now  2. Start Symbicort 160 two inhalations two times per day - samples, coupon  3. Continue OTC Rhinocort one spray each nostril 1 time per day   4. Continue omeprazole 20 one tablet one time per day  5. Use ProAir Respiclick (Coupon) 2 inhalations every 4-6 hours or albuterol nebulization every 4-6 hours if needed.  6. Can use cloderm applied around eyes twice a day until resolved.   7. Return in 4 weeks or earlier if problem  Laura Clay once again has had an exacerbation of her asthma most likely secondary to the fact that she discontinued her medications over the course the past 2 months. I'll  give her some systemic anti-inflammatory agents and have her start Symbicort. As well, I've given her a topical anti-inflammatory agent to use with what appears to be a contact dermatitis that hopefully will resolve over the course the next week or 2 with a combination of systemic steroid and her topical steroid. I'll see her back in this clinic in 4 weeks or earlier if there is a problem. At that point in time we'll approach the possibility of starting a biological agent to treat her asthma given the fact that she has already developed respiratory tract failure requiring an ICU admission back in February 2017 and is not the best about consistently using her anti-inflammatory medications for her respiratory tract.  Laurette SchimkeEric Javoni Lucken, MD [facility]

## 2016-06-29 NOTE — Patient Instructions (Addendum)
  1. Depomedrol 80 IM now plus Prednisone 10mg  - four tabs now  2. Start Symbicort 160 two inhalations two times per day - samples, coupon  3. Continue OTC Rhinocort one spray each nostril 1 time per day   4. Continue omeprazole 20 one tablet one time per day  5. Use ProAir Respiclick (Coupon) 2 inhalations every 4-6 hours or albuterol nebulization every 4-6 hours if needed.  6. Can use cloderm applied around eyes twice a day until resolved.   7. Return in 4 weeks or earlier if problem

## 2016-07-04 ENCOUNTER — Telehealth: Payer: Self-pay | Admitting: *Deleted

## 2016-07-04 MED ORDER — METHYLPREDNISOLONE ACETATE 80 MG/ML IJ SUSP
80.0000 mg | Freq: Once | INTRAMUSCULAR | Status: AC
  Administered 2016-06-29: 80 mg via INTRAMUSCULAR

## 2016-07-04 NOTE — Telephone Encounter (Signed)
What mild topical steroid cream will cover?

## 2016-07-04 NOTE — Telephone Encounter (Signed)
Patient insurance will not cover Clocortolone Piv 0.1 % cream. Please advise.

## 2016-07-05 MED ORDER — DESONIDE 0.05 % EX CREA: TOPICAL_CREAM | Freq: Two times a day (BID) | CUTANEOUS | 2 refills | Status: DC

## 2016-07-05 NOTE — Telephone Encounter (Signed)
Sent alternate rx in to [facility] and L/m for patient advising same

## 2016-07-05 NOTE — Telephone Encounter (Signed)
Looks like coverage for Fluocinonide, triamcinolone,desonide,hydrocortisone, clobetasol

## 2016-07-05 NOTE — Telephone Encounter (Signed)
Please provide patient with desonide cream.

## 2016-07-05 NOTE — Addendum Note (Signed)
Addended by: Devoria GlassingVONCANNON, TAMMY M on: 07/05/2016 02:58 PM   Modules accepted: Orders

## 2016-07-07 ENCOUNTER — Other Ambulatory Visit: Payer: Self-pay | Admitting: *Deleted

## 2016-07-07 ENCOUNTER — Telehealth: Payer: Self-pay | Admitting: *Deleted

## 2016-07-07 MED ORDER — AMOXICILLIN-POT CLAVULANATE 875-125 MG PO TABS: ORAL_TABLET | ORAL | 0 refills | Status: DC

## 2016-07-07 MED ORDER — PREDNISONE 10 MG PO TABS: ORAL_TABLET | ORAL | 0 refills | Status: DC

## 2016-07-07 NOTE — Telephone Encounter (Signed)
Patien advised symptoms for 2 days yellow nasal drainage went to chest deep cough and wheezing requesting something to be called in. She is currently taking Symbicort 160 2 bid and Rhinocort and using albuterol inhaler and nebulizer

## 2016-07-07 NOTE — Telephone Encounter (Signed)
Please prescribe patient Augmentin 875 one tablet twice a day for the next 14 days and prednisone 10 mg tablet 1 tablet once a day for the next 10 days

## 2016-07-07 NOTE — Telephone Encounter (Signed)
Patient informed and RX sent.  

## 2016-07-08 ENCOUNTER — Inpatient Hospital Stay (HOSPITAL_COMMUNITY)
Admission: EM | Admit: 2016-07-08 | Discharge: 2016-07-10 | DRG: 203 | Disposition: A | Discharge status: Home / Self Care | Payer: 59 | Attending: Internal Medicine | Admitting: Internal Medicine

## 2016-07-08 ENCOUNTER — Encounter (HOSPITAL_COMMUNITY): Payer: Self-pay | Admitting: Emergency Medicine

## 2016-07-08 ENCOUNTER — Emergency Department (HOSPITAL_COMMUNITY): Payer: 59

## 2016-07-08 DIAGNOSIS — G4700 Insomnia, unspecified: Secondary | ICD-10-CM | POA: Diagnosis present

## 2016-07-08 DIAGNOSIS — Z79899 Other long term (current) drug therapy: Secondary | ICD-10-CM

## 2016-07-08 DIAGNOSIS — Z803 Family history of malignant neoplasm of breast: Secondary | ICD-10-CM

## 2016-07-08 DIAGNOSIS — F17210 Nicotine dependence, cigarettes, uncomplicated: Secondary | ICD-10-CM | POA: Diagnosis present

## 2016-07-08 DIAGNOSIS — Z8051 Family history of malignant neoplasm of kidney: Secondary | ICD-10-CM

## 2016-07-08 DIAGNOSIS — Z8249 Family history of ischemic heart disease and other diseases of the circulatory system: Secondary | ICD-10-CM

## 2016-07-08 DIAGNOSIS — J4541 Moderate persistent asthma with (acute) exacerbation: Principal | ICD-10-CM | POA: Diagnosis present

## 2016-07-08 DIAGNOSIS — Z833 Family history of diabetes mellitus: Secondary | ICD-10-CM

## 2016-07-08 DIAGNOSIS — K219 Gastro-esophageal reflux disease without esophagitis: Secondary | ICD-10-CM | POA: Diagnosis present

## 2016-07-08 DIAGNOSIS — R0602 Shortness of breath: Secondary | ICD-10-CM | POA: Diagnosis not present

## 2016-07-08 DIAGNOSIS — Z7951 Long term (current) use of inhaled steroids: Secondary | ICD-10-CM

## 2016-07-08 DIAGNOSIS — Z800 Family history of malignant neoplasm of digestive organs: Secondary | ICD-10-CM

## 2016-07-08 DIAGNOSIS — I10 Essential (primary) hypertension: Secondary | ICD-10-CM | POA: Diagnosis present

## 2016-07-08 DIAGNOSIS — R0902 Hypoxemia: Secondary | ICD-10-CM | POA: Diagnosis present

## 2016-07-08 DIAGNOSIS — R000 Tachycardia, unspecified: Secondary | ICD-10-CM

## 2016-07-08 DIAGNOSIS — Z90710 Acquired absence of both cervix and uterus: Secondary | ICD-10-CM

## 2016-07-08 LAB — CBC WITH DIFFERENTIAL/PLATELET
BASOS ABS: 0 10*3/uL (ref 0.0–0.1)
BASOS PCT: 0 %
Eosinophils Absolute: 0 10*3/uL (ref 0.0–0.7)
Eosinophils Relative: 0 %
HEMATOCRIT: 45.9 % (ref 36.0–46.0)
Hemoglobin: 16.3 g/dL — ABNORMAL HIGH (ref 12.0–15.0)
LYMPHS PCT: 13 %
Lymphs Abs: 2 10*3/uL (ref 0.7–4.0)
MCH: 33.3 pg (ref 26.0–34.0)
MCHC: 35.5 g/dL (ref 30.0–36.0)
MCV: 93.9 fL (ref 78.0–100.0)
Monocytes Absolute: 0.7 10*3/uL (ref 0.1–1.0)
Monocytes Relative: 4 %
NEUTROS ABS: 13.2 10*3/uL — AB (ref 1.7–7.7)
Neutrophils Relative %: 83 %
PLATELETS: 212 10*3/uL (ref 150–400)
RBC: 4.89 MIL/uL (ref 3.87–5.11)
RDW: 12.7 % (ref 11.5–15.5)
WBC: 15.9 10*3/uL — AB (ref 4.0–10.5)

## 2016-07-08 LAB — BASIC METABOLIC PANEL
ANION GAP: 11 (ref 5–15)
BUN: 10 mg/dL (ref 6–20)
CO2: 21 mmol/L — ABNORMAL LOW (ref 22–32)
Calcium: 9.2 mg/dL (ref 8.9–10.3)
Chloride: 103 mmol/L (ref 101–111)
Creatinine, Ser: 0.86 mg/dL (ref 0.44–1.00)
GLUCOSE: 135 mg/dL — AB (ref 65–99)
POTASSIUM: 4 mmol/L (ref 3.5–5.1)
Sodium: 135 mmol/L (ref 135–145)

## 2016-07-08 MED ORDER — ALBUTEROL (5 MG/ML) CONTINUOUS INHALATION SOLN
INHALATION_SOLUTION | RESPIRATORY_TRACT | Status: AC
  Administered 2016-07-08: 21:00:00
  Filled 2016-07-08: qty 20

## 2016-07-08 MED ORDER — ALBUTEROL (5 MG/ML) CONTINUOUS INHALATION SOLN
5.0000 mg/h | INHALATION_SOLUTION | RESPIRATORY_TRACT | Status: DC
  Administered 2016-07-08: 5 mg/h via RESPIRATORY_TRACT

## 2016-07-08 MED ORDER — MAGNESIUM SULFATE 2 GM/50ML IV SOLN
2.0000 g | Freq: Once | INTRAVENOUS | Status: AC
  Administered 2016-07-08: 2 g via INTRAVENOUS
  Filled 2016-07-08: qty 50

## 2016-07-08 MED ORDER — ALBUTEROL SULFATE (2.5 MG/3ML) 0.083% IN NEBU
5.0000 mg | INHALATION_SOLUTION | Freq: Once | RESPIRATORY_TRACT | Status: AC
  Administered 2016-07-08: 5 mg via RESPIRATORY_TRACT
  Filled 2016-07-08: qty 6

## 2016-07-08 MED ORDER — ALBUTEROL (5 MG/ML) CONTINUOUS INHALATION SOLN
5.0000 mg/h | INHALATION_SOLUTION | RESPIRATORY_TRACT | Status: DC
  Administered 2016-07-08: 5 mg/h via RESPIRATORY_TRACT
  Filled 2016-07-08 (×2): qty 20

## 2016-07-08 MED ORDER — PREDNISONE 20 MG PO TABS
60.0000 mg | ORAL_TABLET | Freq: Once | ORAL | Status: AC
  Administered 2016-07-08: 60 mg via ORAL
  Filled 2016-07-08: qty 3

## 2016-07-08 MED ORDER — IPRATROPIUM BROMIDE 0.02 % IN SOLN
RESPIRATORY_TRACT | Status: AC
  Administered 2016-07-08: 21:00:00
  Filled 2016-07-08: qty 5

## 2016-07-08 MED ORDER — METHYLPREDNISOLONE SODIUM SUCC 125 MG IJ SOLR
INTRAMUSCULAR | Status: AC
  Administered 2016-07-09: 62.5 mg via INTRAMUSCULAR
  Filled 2016-07-08: qty 2

## 2016-07-08 MED ORDER — ACETAMINOPHEN 325 MG PO TABS
650.0000 mg | ORAL_TABLET | Freq: Once | ORAL | Status: AC
  Administered 2016-07-08: 650 mg via ORAL
  Filled 2016-07-08: qty 2

## 2016-07-08 MED ORDER — ALBUTEROL (5 MG/ML) CONTINUOUS INHALATION SOLN
10.0000 mg/h | INHALATION_SOLUTION | RESPIRATORY_TRACT | Status: AC
  Administered 2016-07-08: 10 mg/h via RESPIRATORY_TRACT

## 2016-07-08 MED ORDER — METHYLPREDNISOLONE SODIUM SUCC 125 MG IJ SOLR
125.0000 mg | Freq: Once | INTRAMUSCULAR | Status: AC
  Administered 2016-07-08: 125 mg via INTRAVENOUS

## 2016-07-08 MED ORDER — IPRATROPIUM BROMIDE 0.02 % IN SOLN
1.0000 mg | Freq: Once | RESPIRATORY_TRACT | Status: AC
  Administered 2016-07-08: 1 mg via RESPIRATORY_TRACT

## 2016-07-08 NOTE — ED Triage Notes (Signed)
Pt states she has been short of breath for the past 2 days with a cough  Pt states she has been getting progressively worse  Pt states her pulmonary dr is changing her medications and she was started on prednisone last week  Pt states she had a flu shot 2 weeks ago and has also had cold sxs  Pt's blood pressure is elevated in triage  Pt states she has hx of HTN but does not take any medication for it  Pt state she has been using her albuterol neb today without relief

## 2016-07-08 NOTE — ED Provider Notes (Signed)
[facility]-EMERGENCY DEPT Provider Note   CSN: [id number] Arrival date & time: 07/08/16  1901     History   Chief Complaint Chief Complaint  Patient presents with  . Shortness of Breath    HPI Laura Clay is a 45 y.o. female.  The history is provided by the patient.  Shortness of Breath  This is a recurrent problem. The average episode lasts 2 days. The problem occurs continuously.The problem has been gradually worsening. Associated symptoms include a fever (subjective), rhinorrhea, cough and sputum production. Pertinent negatives include no sore throat, no ear pain, no chest pain, no vomiting, no abdominal pain and no rash. She has tried oral steroids, beta-agonist inhalers and inhaled steroids for the symptoms. The treatment provided mild relief. Associated medical issues include asthma.   Treated for presumed PNA with Amoxicillin by PCP. On 10 mg prednisone daily as well.  Past Medical History:  Diagnosis Date  . Anxiety   . Asthma   . Depression   . GERD (gastroesophageal reflux disease)   . Hypertension   . Kidney stones     Patient Active Problem List   Diagnosis Date Noted  . Influenza A 10/02/2015  . Status asthmaticus 10/01/2015  . Leukocytosis 10/01/2015  . Hyperglycemia 10/01/2015  . Depression with anxiety 06/22/2012  . HTN (hypertension), accelerated  05/08/2012    Past Surgical History:  Procedure Laterality Date  . ABDOMINAL HYSTERECTOMY  2000   Abnormal PAPs  . BUNIONECTOMY Left   . OVARIAN CYST REMOVAL      OB History    No data available       Home Medications    Prior to Admission medications   Medication Sig Start Date End Date Taking? Authorizing Provider  albuterol (PROAIR HFA) 108 (90 Base) MCG/ACT inhaler Inhale 2 puffs into the lungs every 4 (four) hours as needed for wheezing or shortness of breath. 05/17/16  Yes Jessica PriestEric J Kozlow, MD  albuterol (PROVENTIL) (2.5 MG/3ML) 0.083% nebulizer solution USE ONE VIAL IN NEBULIZER EVERY 4 TO 6 HOURS  AS NEEDED FOR COUGH OR WHEEZING 06/27/16  Yes Jessica PriestEric J Kozlow, MD  amoxicillin-clavulanate (AUGMENTIN) 875-125 MG tablet Take one tablet twice daily for 14 days 07/07/16  Yes Jessica PriestEric J Kozlow, MD  budesonide (RHINOCORT ALLERGY) 32 MCG/ACT nasal spray Place 2 sprays into both nostrils daily.   Yes Historical Provider, MD  budesonide-formoterol (SYMBICORT) 160-4.5 MCG/ACT inhaler Inhale 2 puffs into the lungs 2 (two) times daily.   Yes Historical Provider, MD  desonide (DESOWEN) 0.05 % cream Apply topically 2 (two) times daily. 07/05/16  Yes Jessica PriestEric J Kozlow, MD  dextromethorphan (DELSYM) 30 MG/5ML liquid Take 30 mg by mouth 2 (two) times daily as needed for cough.   Yes Historical Provider, MD  Dextromethorphan-Guaifenesin [facility](MUCINEX DM PO) Take 1 tablet by mouth 2 (two) times daily.    Yes Historical Provider, MD  diphenhydrAMINE (BENADRYL) 25 MG tablet Take 50 mg by mouth at bedtime.    Yes Historical Provider, MD  Flunisolide HFA (AEROSPAN) 80 MCG/ACT AERS Inhale 2 puffs into the lungs daily as needed (sob and wheezing).   Yes Historical Provider, MD  ibuprofen (ADVIL,MOTRIN) 200 MG tablet Take 600 mg by mouth every 6 (six) hours as needed for headache.    Yes Historical Provider, MD  mometasone (NASONEX) 50 MCG/ACT nasal spray Place 1 spray into the nose daily as needed (allergies).    Yes Historical Provider, MD  Multiple Vitamin (MULTIVITAMIN) tablet Take 1 tablet by mouth daily.  Yes Historical Provider, MD  omeprazole (PRILOSEC) 20 MG capsule Take 20 mg by mouth daily.    Yes Historical Provider, MD  predniSONE (DELTASONE) 10 MG tablet Take one tablet once daily for 10 days 07/07/16  Yes Jessica Priest, MD  valACYclovir (VALTREX) 1000 MG tablet Take 1,000 mg by mouth daily.   Yes Historical Provider, MD  mometasone-formoterol (DULERA) 200-5 MCG/ACT AERO Inhale 2 puffs into the lungs 2 (two) times daily. Patient not taking: Reported on 07/08/2016 05/09/16   Jessica Priest, MD    Family History Family  History  Problem Relation Age of Onset  . Breast cancer Maternal Grandmother   . Colon cancer Maternal Aunt   . Colon polyps Cousin   . Colon polyps Maternal Aunt   . Diabetes Father   . Heart disease Father   . Kidney disease Father   . Kidney cancer Maternal Uncle     Social History Social History  Substance Use Topics  . Smoking status: Current Some Day Smoker    Years: 12.00    Types: Cigarettes  . Smokeless tobacco: Never Used     Comment: form given 09/25/12  . Alcohol use Yes     Comment: rarely     Allergies   Dilaudid [hydromorphone hcl] and Morphine   Review of Systems Review of Systems  Constitutional: Positive for fever (subjective). Negative for chills.  HENT: Positive for rhinorrhea. Negative for ear pain and sore throat.   Eyes: Negative for pain and visual disturbance.  Respiratory: Positive for cough, sputum production and shortness of breath.   Cardiovascular: Negative for chest pain and palpitations.  Gastrointestinal: Negative for abdominal pain and vomiting.  Genitourinary: Negative for dysuria and hematuria.  Musculoskeletal: Negative for arthralgias and back pain.  Skin: Negative for color change and rash.  Neurological: Negative for seizures and syncope.  All other systems reviewed and are negative.    Physical Exam Updated Vital Signs BP (!) 152/101   Pulse (!) 126   Temp 98.3 F (36.8 C) (Oral)   Resp 24   SpO2 96%   Physical Exam  Constitutional: She is oriented to person, place, and time. She appears well-developed and well-nourished. No distress.  HENT:  Head: Normocephalic and atraumatic.  Nose: Nose normal.  Eyes: Conjunctivae and EOM are normal. Pupils are equal, round, and reactive to light. Right eye exhibits no discharge. Left eye exhibits no discharge. No scleral icterus.  Neck: Normal range of motion. Neck supple.  Cardiovascular: Regular rhythm.  Tachycardia present.  Exam reveals no gallop and no friction rub.   No  murmur heard. Pulmonary/Chest: Effort normal. No stridor. No respiratory distress. She has wheezes (diffuse). She has no rales.  Abdominal: Soft. She exhibits no distension. There is no tenderness.  Musculoskeletal: She exhibits no edema or tenderness.  Neurological: She is alert and oriented to person, place, and time.  Skin: Skin is warm and dry. No rash noted. She is not diaphoretic. No erythema.  Psychiatric: She has a normal mood and affect.  Vitals reviewed.    ED Treatments / Results  Labs (all labs ordered are listed, but only abnormal results are displayed) Labs Reviewed  CBC WITH DIFFERENTIAL/PLATELET - Abnormal; Notable for the following:       Result Value   WBC 15.9 (*)    Hemoglobin 16.3 (*)    Neutro Abs 13.2 (*)    All other components within normal limits  BASIC METABOLIC PANEL - Abnormal; Notable for the following:  CO2 21 (*)    Glucose, Bld 135 (*)    All other components within normal limits  BLOOD GAS, ARTERIAL    EKG  EKG Interpretation None       Radiology Dg Chest 2 View  Result Date: 07/08/2016 CLINICAL DATA:  Shortness of breath and cough for 2 days EXAM: CHEST  2 VIEW COMPARISON:  10/01/2015 FINDINGS: Mild interstitial prominence similar compared to previous exam. No acute consolidation or effusion. Heart size is normal. No pneumothorax. IMPRESSION: No focal pulmonary infiltrate or overt pulmonary edema. Electronically Signed   By: Jasmine PangKim  Fujinaga M.D.   On: 07/08/2016 21:46    Procedures Procedures (including critical care time)  Medications Ordered in ED Medications  albuterol (PROVENTIL,VENTOLIN) solution continuous neb (0 mg/hr Nebulization Stopped 07/08/16 2207)  albuterol (PROVENTIL,VENTOLIN) solution continuous neb (5 mg/hr Nebulization New Bag/Given 07/08/16 2150)  albuterol (PROVENTIL,VENTOLIN) solution continuous neb (not administered)  methylPREDNISolone sodium succinate (SOLU-MEDROL) 125 mg/2 mL injection 125 mg (not administered)   albuterol (PROVENTIL) (2.5 MG/3ML) 0.083% nebulizer solution 5 mg (5 mg Nebulization Given 07/08/16 1951)  ipratropium (ATROVENT) nebulizer solution 1 mg (1 mg Nebulization Given 07/08/16 2013)  albuterol (PROVENTIL, VENTOLIN) (5 MG/ML) 0.5% continuous inhalation solution (  Given by Other 07/08/16 2053)  ipratropium (ATROVENT) 0.02 % nebulizer solution (  Given 07/08/16 2054)  predniSONE (DELTASONE) tablet 60 mg (60 mg Oral Given 07/08/16 2039)  acetaminophen (TYLENOL) tablet 650 mg (650 mg Oral Given 07/08/16 2039)  magnesium sulfate IVPB 2 g 50 mL (0 g Intravenous Stopped 07/08/16 2306)     Initial Impression / Assessment and Plan / ED Course  I have reviewed the triage vital signs and the nursing notes.  Pertinent labs & imaging results that were available during my care of the patient were reviewed by me and considered in my medical decision making (see chart for details).  Clinical Course     Asthma exacerbation. Multiple rounds of breathing treatments attempted in addition to by mouth and IV steroids as well as magnesium without improvement. Chest x-ray without evidence of pneumonia.  Require admission for continued management.  Final Clinical Impressions(s) / ED Diagnoses   Final diagnoses:  Moderate persistent asthma with exacerbation      Nira ConnPedro Eduardo Tyrah Broers, MD 07/08/16 [phone]

## 2016-07-09 DIAGNOSIS — I10 Essential (primary) hypertension: Secondary | ICD-10-CM

## 2016-07-09 DIAGNOSIS — J45901 Unspecified asthma with (acute) exacerbation: Secondary | ICD-10-CM | POA: Diagnosis not present

## 2016-07-09 DIAGNOSIS — R0902 Hypoxemia: Secondary | ICD-10-CM

## 2016-07-09 DIAGNOSIS — J4541 Moderate persistent asthma with (acute) exacerbation: Secondary | ICD-10-CM | POA: Diagnosis not present

## 2016-07-09 DIAGNOSIS — Z8249 Family history of ischemic heart disease and other diseases of the circulatory system: Secondary | ICD-10-CM | POA: Diagnosis not present

## 2016-07-09 DIAGNOSIS — Z79899 Other long term (current) drug therapy: Secondary | ICD-10-CM | POA: Diagnosis not present

## 2016-07-09 DIAGNOSIS — Z7951 Long term (current) use of inhaled steroids: Secondary | ICD-10-CM | POA: Diagnosis not present

## 2016-07-09 DIAGNOSIS — G4700 Insomnia, unspecified: Secondary | ICD-10-CM | POA: Diagnosis present

## 2016-07-09 DIAGNOSIS — R000 Tachycardia, unspecified: Secondary | ICD-10-CM

## 2016-07-09 DIAGNOSIS — Z803 Family history of malignant neoplasm of breast: Secondary | ICD-10-CM | POA: Diagnosis not present

## 2016-07-09 DIAGNOSIS — Z8051 Family history of malignant neoplasm of kidney: Secondary | ICD-10-CM | POA: Diagnosis not present

## 2016-07-09 DIAGNOSIS — K219 Gastro-esophageal reflux disease without esophagitis: Secondary | ICD-10-CM | POA: Diagnosis present

## 2016-07-09 DIAGNOSIS — Z90710 Acquired absence of both cervix and uterus: Secondary | ICD-10-CM | POA: Diagnosis not present

## 2016-07-09 DIAGNOSIS — Z800 Family history of malignant neoplasm of digestive organs: Secondary | ICD-10-CM | POA: Diagnosis not present

## 2016-07-09 DIAGNOSIS — F17210 Nicotine dependence, cigarettes, uncomplicated: Secondary | ICD-10-CM | POA: Diagnosis present

## 2016-07-09 DIAGNOSIS — J441 Chronic obstructive pulmonary disease with (acute) exacerbation: Secondary | ICD-10-CM | POA: Diagnosis not present

## 2016-07-09 DIAGNOSIS — R0602 Shortness of breath: Secondary | ICD-10-CM | POA: Diagnosis present

## 2016-07-09 DIAGNOSIS — Z833 Family history of diabetes mellitus: Secondary | ICD-10-CM | POA: Diagnosis not present

## 2016-07-09 LAB — BLOOD GAS, VENOUS
Acid-base deficit: 4.3 mmol/L — ABNORMAL HIGH (ref 0.0–2.0)
BICARBONATE: 19.7 mmol/L — AB (ref 20.0–28.0)
DRAWN BY: 235321
FIO2: 21
O2 Saturation: 75 %
PATIENT TEMPERATURE: 98.6
PH VEN: 7.37 (ref 7.250–7.430)
PO2 VEN: 42.4 mmHg (ref 32.0–45.0)
pCO2, Ven: 34.9 mmHg — ABNORMAL LOW (ref 44.0–60.0)

## 2016-07-09 LAB — CBC
HCT: 42.6 % (ref 36.0–46.0)
Hemoglobin: 14.3 g/dL (ref 12.0–15.0)
MCH: 32.6 pg (ref 26.0–34.0)
MCHC: 33.6 g/dL (ref 30.0–36.0)
MCV: 97 fL (ref 78.0–100.0)
PLATELETS: 216 10*3/uL (ref 150–400)
RBC: 4.39 MIL/uL (ref 3.87–5.11)
RDW: 13.2 % (ref 11.5–15.5)
WBC: 14.3 10*3/uL — ABNORMAL HIGH (ref 4.0–10.5)

## 2016-07-09 LAB — BASIC METABOLIC PANEL
Anion gap: 16 — ABNORMAL HIGH (ref 5–15)
BUN: 9 mg/dL (ref 6–20)
CALCIUM: 8.2 mg/dL — AB (ref 8.9–10.3)
CO2: 15 mmol/L — ABNORMAL LOW (ref 22–32)
CREATININE: 0.82 mg/dL (ref 0.44–1.00)
Chloride: 105 mmol/L (ref 101–111)
GFR calc Af Amer: >60 mL/min (ref >60)
GLUCOSE: 187 mg/dL — AB (ref 65–99)
Potassium: 3.8 mmol/L (ref 3.5–5.1)
SODIUM: 136 mmol/L (ref 135–145)

## 2016-07-09 MED ORDER — PANTOPRAZOLE SODIUM 40 MG PO TBEC
40.0000 mg | DELAYED_RELEASE_TABLET | Freq: Every day | ORAL | Status: DC
  Administered 2016-07-09 – 2016-07-10 (×2): 40 mg via ORAL
  Filled 2016-07-09 (×2): qty 1

## 2016-07-09 MED ORDER — BUDESONIDE 0.25 MG/2ML IN SUSP
0.2500 mg | Freq: Two times a day (BID) | RESPIRATORY_TRACT | Status: DC
  Administered 2016-07-09 – 2016-07-10 (×3): 0.25 mg via RESPIRATORY_TRACT
  Filled 2016-07-09 (×3): qty 2

## 2016-07-09 MED ORDER — DEXTROMETHORPHAN POLISTIREX ER 30 MG/5ML PO SUER
30.0000 mg | Freq: Two times a day (BID) | ORAL | Status: DC | PRN
  Administered 2016-07-10: 30 mg via ORAL
  Filled 2016-07-09 (×2): qty 5

## 2016-07-09 MED ORDER — ALBUTEROL SULFATE (2.5 MG/3ML) 0.083% IN NEBU: 2.5000 mg | INHALATION_SOLUTION | RESPIRATORY_TRACT | Status: DC | PRN

## 2016-07-09 MED ORDER — DIPHENHYDRAMINE HCL 25 MG PO CAPS
50.0000 mg | ORAL_CAPSULE | Freq: Every evening | ORAL | Status: DC | PRN
  Administered 2016-07-09 (×2): 50 mg via ORAL
  Filled 2016-07-09: qty 2

## 2016-07-09 MED ORDER — METHYLPREDNISOLONE SODIUM SUCC 125 MG IJ SOLR
60.0000 mg | Freq: Two times a day (BID) | INTRAMUSCULAR | Status: DC
  Administered 2016-07-09 – 2016-07-10 (×2): 60 mg via INTRAVENOUS
  Filled 2016-07-09 (×2): qty 2

## 2016-07-09 MED ORDER — VALACYCLOVIR HCL 500 MG PO TABS
1000.0000 mg | ORAL_TABLET | Freq: Every day | ORAL | Status: DC
  Administered 2016-07-09 – 2016-07-10 (×2): 1000 mg via ORAL
  Filled 2016-07-09 (×2): qty 2

## 2016-07-09 MED ORDER — KETOROLAC TROMETHAMINE 30 MG/ML IJ SOLN
30.0000 mg | Freq: Four times a day (QID) | INTRAMUSCULAR | Status: DC | PRN
  Administered 2016-07-09 – 2016-07-10 (×2): 30 mg via INTRAVENOUS
  Filled 2016-07-09 (×2): qty 1

## 2016-07-09 MED ORDER — HYDRALAZINE HCL 20 MG/ML IJ SOLN
20.0000 mg | Freq: Four times a day (QID) | INTRAMUSCULAR | Status: DC | PRN
  Administered 2016-07-09 (×2): 20 mg via INTRAVENOUS
  Filled 2016-07-09 (×2): qty 1

## 2016-07-09 MED ORDER — ALUM & MAG HYDROXIDE-SIMETH 200-200-20 MG/5ML PO SUSP
30.0000 mL | ORAL | Status: DC | PRN
  Administered 2016-07-09: 30 mL via ORAL
  Filled 2016-07-09: qty 30

## 2016-07-09 MED ORDER — METHYLPREDNISOLONE SODIUM SUCC 125 MG IJ SOLR: 60.0000 mg | Freq: Four times a day (QID) | INTRAMUSCULAR | Status: DC

## 2016-07-09 MED ORDER — ALBUTEROL SULFATE (2.5 MG/3ML) 0.083% IN NEBU
2.5000 mg | INHALATION_SOLUTION | Freq: Four times a day (QID) | RESPIRATORY_TRACT | Status: DC
  Administered 2016-07-09 (×3): 2.5 mg via RESPIRATORY_TRACT
  Filled 2016-07-09 (×3): qty 3

## 2016-07-09 MED ORDER — ONDANSETRON HCL 4 MG PO TABS: 4.0000 mg | ORAL_TABLET | Freq: Four times a day (QID) | ORAL | Status: DC | PRN

## 2016-07-09 MED ORDER — ACETAMINOPHEN 325 MG PO TABS
650.0000 mg | ORAL_TABLET | Freq: Four times a day (QID) | ORAL | Status: DC | PRN
  Administered 2016-07-09 (×2): 650 mg via ORAL
  Filled 2016-07-09: qty 2

## 2016-07-09 MED ORDER — ALBUTEROL SULFATE (2.5 MG/3ML) 0.083% IN NEBU
2.5000 mg | INHALATION_SOLUTION | Freq: Four times a day (QID) | RESPIRATORY_TRACT | Status: DC
  Administered 2016-07-10 (×2): 2.5 mg via RESPIRATORY_TRACT
  Filled 2016-07-09 (×2): qty 3

## 2016-07-09 MED ORDER — ALBUTEROL SULFATE (2.5 MG/3ML) 0.083% IN NEBU
2.5000 mg | INHALATION_SOLUTION | RESPIRATORY_TRACT | Status: DC | PRN
  Administered 2016-07-09: 2.5 mg via RESPIRATORY_TRACT
  Filled 2016-07-09: qty 3

## 2016-07-09 MED ORDER — ONDANSETRON HCL 4 MG/2ML IJ SOLN: 4.0000 mg | Freq: Four times a day (QID) | INTRAMUSCULAR | Status: DC | PRN

## 2016-07-09 MED ORDER — HYDROCODONE-ACETAMINOPHEN 7.5-325 MG PO TABS
1.0000 | ORAL_TABLET | Freq: Four times a day (QID) | ORAL | Status: DC | PRN
  Administered 2016-07-09 (×2): 1 via ORAL
  Filled 2016-07-09 (×2): qty 1

## 2016-07-09 MED ORDER — ACETAMINOPHEN 650 MG RE SUPP: 650.0000 mg | Freq: Four times a day (QID) | RECTAL | Status: DC | PRN

## 2016-07-09 MED ORDER — DIPHENHYDRAMINE HCL 50 MG PO CAPS: 50.0000 mg | ORAL_CAPSULE | Freq: Once | ORAL | Status: DC

## 2016-07-09 MED ORDER — MOMETASONE FURO-FORMOTEROL FUM 200-5 MCG/ACT IN AERO
2.0000 | INHALATION_SPRAY | Freq: Two times a day (BID) | RESPIRATORY_TRACT | Status: DC
  Filled 2016-07-09: qty 8.8

## 2016-07-09 MED ORDER — ALBUTEROL SULFATE (2.5 MG/3ML) 0.083% IN NEBU: 2.5000 mg | INHALATION_SOLUTION | Freq: Four times a day (QID) | RESPIRATORY_TRACT | Status: DC

## 2016-07-09 MED ORDER — KETOROLAC TROMETHAMINE 30 MG/ML IJ SOLN: 30.0000 mg | Freq: Four times a day (QID) | INTRAMUSCULAR | Status: DC

## 2016-07-09 NOTE — ED Notes (Signed)
Care hand off [facility].

## 2016-07-09 NOTE — Progress Notes (Addendum)
Patient ID: Laura Clay, female   DOB: 23-Oct-1970, 45 y.o.   MRN: [id number] Pt seen and examined at the bedside this am.  Pt admitted after midnight so for details please refer to admission note done today 12/2/217.  45 y.o. female with past medical history of asthma [facility] worsening shortness of breath, wheezing in past 1-2 days prior to this admission. No fevers.  In ED, she was hypoxic with O2 sats 89% on room air but this has improved to 99% with [place] oxygen support.   Assessment and Plan:  Asthma exacerbation - Stable respiratory status but still wheezing this am - Will continue solumedrol but will reduce frequency to Q 12 hours (from Q 6 hours)  - Continue albuterol every 6 hours scheduled and every 2 hours as needed for shortness of breath or wheezing  - Continue oxygen support via  to keep O2 sats above 90%   [name] [facility] [phone]

## 2016-07-09 NOTE — H&P (Signed)
History and Physical    Laura Dropnna Shouse WUJ:[id number] DOB: Dec 18, 1970 DOA: 07/08/2016  PCP: Elvina SidleKurt Lauenstein, MD   Patient coming from: Home  Chief Complaint: Shortness of breath, increased wheezing  HPI: Laura Clay is a 45 y.o. woman with a history of asthma who has had cough, congestion (yellow secretions), progressive shortness of breath, and headache for the past three days.  Her significant other has also had URI symptoms.  She came to the ED tonight for increased wheezing and increased work of breathing.  No fever.  She was found to have mild hypoxia upon arrival.  She is not on home oxygen.    Of note, she saw an outpatient provider earlier this week for her symptoms, and she was given prescriptions for Augmentin and oral prednisone.  Also, she had her flu shot approximately two weeks ago.  In February, she presented with an asthma exacerbation in the setting of influenza A.  She received Tamiflu.  She required BiPAP therapy for several days.  She has never been intubated for an asthma exacerbation.  ED Course: Symptoms have been refractory to multiple albuterol nebulizer treatments, IV magnesium, oral and IV steroids.  Hospitalist asked to place in observation.  O2 sat now 96% on 3L [place].  Review of Systems: As per HPI otherwise 10 point review of systems negative.    Past Medical History:  Diagnosis Date  . Anxiety   . Asthma   . Depression   . GERD (gastroesophageal reflux disease)   . Hypertension   . Kidney stones     Past Surgical History:  Procedure Laterality Date  . ABDOMINAL HYSTERECTOMY  2000   Abnormal PAPs  . BUNIONECTOMY Left   . OVARIAN CYST REMOVAL       reports that she has been smoking Cigarettes.  She has smoked for the past 12.00 years. She has never used smokeless tobacco. She reports that she drinks alcohol. She reports that she does not use drugs.  She reports social tobacco and EtOH use to me.  No illicit drug use.  Allergies  Allergen Reactions  .  Dilaudid [Hydromorphone Hcl] Shortness Of Breath, Swelling and Other (See Comments)    Redness in hands and thorat.  . Morphine     REACTION: per pt gives her hives    Family History  Problem Relation Age of Onset  . Breast cancer Maternal Grandmother   . Colon cancer Maternal Aunt   . Colon polyps Cousin   . Colon polyps Maternal Aunt   . Diabetes Father   . Heart disease Father   . Kidney disease Father   . Kidney cancer Maternal Uncle      Prior to Admission medications   Medication Sig Start Date End Date Taking? Authorizing Provider  albuterol (PROAIR HFA) 108 (90 Base) MCG/ACT inhaler Inhale 2 puffs into the lungs every 4 (four) hours as needed for wheezing or shortness of breath. 05/17/16  Yes Jessica PriestEric J Kozlow, MD  albuterol (PROVENTIL) (2.5 MG/3ML) 0.083% nebulizer solution USE ONE VIAL IN NEBULIZER EVERY 4 TO 6 HOURS AS NEEDED FOR COUGH OR WHEEZING 06/27/16  Yes Jessica PriestEric J Kozlow, MD  amoxicillin-clavulanate (AUGMENTIN) 875-125 MG tablet Take one tablet twice daily for 14 days 07/07/16  Yes Jessica PriestEric J Kozlow, MD  budesonide (RHINOCORT ALLERGY) 32 MCG/ACT nasal spray Place 2 sprays into both nostrils daily.   Yes Historical Provider, MD  budesonide-formoterol (SYMBICORT) 160-4.5 MCG/ACT inhaler Inhale 2 puffs into the lungs 2 (two) times daily.   Yes  Historical Provider, MD  desonide (DESOWEN) 0.05 % cream Apply topically 2 (two) times daily. 07/05/16  Yes Jessica PriestEric J Kozlow, MD  dextromethorphan (DELSYM) 30 MG/5ML liquid Take 30 mg by mouth 2 (two) times daily as needed for cough.   Yes Historical Provider, MD  Dextromethorphan-Guaifenesin [facility](MUCINEX DM PO) Take 1 tablet by mouth 2 (two) times daily.    Yes Historical Provider, MD  diphenhydrAMINE (BENADRYL) 25 MG tablet Take 50 mg by mouth at bedtime.    Yes Historical Provider, MD  Flunisolide HFA (AEROSPAN) 80 MCG/ACT AERS Inhale 2 puffs into the lungs daily as needed (sob and wheezing).   Yes Historical Provider, MD  ibuprofen  (ADVIL,MOTRIN) 200 MG tablet Take 600 mg by mouth every 6 (six) hours as needed for headache.    Yes Historical Provider, MD  mometasone (NASONEX) 50 MCG/ACT nasal spray Place 1 spray into the nose daily as needed (allergies).    Yes Historical Provider, MD  Multiple Vitamin (MULTIVITAMIN) tablet Take 1 tablet by mouth daily.   Yes Historical Provider, MD  omeprazole (PRILOSEC) 20 MG capsule Take 20 mg by mouth daily.    Yes Historical Provider, MD  predniSONE (DELTASONE) 10 MG tablet Take one tablet once daily for 10 days 07/07/16  Yes Jessica PriestEric J Kozlow, MD  valACYclovir (VALTREX) 1000 MG tablet Take 1,000 mg by mouth daily.   Yes Historical Provider, MD    Physical Exam: Vitals:   07/08/16 2300 07/08/16 2330 07/08/16 2357 07/09/16 0035  BP: (!) 152/101 152/94  (!) 183/102  Pulse: (!) 126 (!) 124  (!) 124  Resp: 24 26  (!) 22  Temp:    98.3 F (36.8 C)  TempSrc:    Oral  SpO2: 96% 97% 95% 96%  Weight:    72.3 kg (159 lb 8 oz)  Height:    5\' 4"  (1.626 m)      Constitutional: Still has mild tachypnea, increased work of breathing, but she resting/able to lie flat Vitals:   07/08/16 2300 07/08/16 2330 07/08/16 2357 07/09/16 0035  BP: (!) 152/101 152/94  (!) 183/102  Pulse: (!) 126 (!) 124  (!) 124  Resp: 24 26  (!) 22  Temp:    98.3 F (36.8 C)  TempSrc:    Oral  SpO2: 96% 97% 95% 96%  Weight:    72.3 kg (159 lb 8 oz)  Height:    5\' 4"  (1.626 m)   Eyes: PERRL, lids and conjunctivae normal ENMT: Mucous membranes are moist. Posterior pharynx clear of any exudate or lesions. Normal dentition.  Neck: normal appearance, supple Respiratory: Bilateral wheezing.  No ronchi.  Increased work of breathing but no accessory muscle use.  Cardiovascular: Tachycardic but regular.  No murmurs / rubs / gallops. No extremity edema. 2+ pedal pulses.  GI: abdomen is soft and compressible.  No distention.  No tenderness.  Bowel sounds are present. Musculoskeletal:  No joint deformity in upper and lower  extremities. Good ROM, no contractures. Normal muscle tone.  Skin: no rashes, warm and dry Neurologic: No focal deficits. Psychiatric: Normal judgment and insight. Alert and oriented x 3. Normal mood.     Labs on Admission: I have personally reviewed following labs and imaging studies  CBC:  Recent Labs Lab 07/08/16 2035  WBC 15.9*  NEUTROABS 13.2*  HGB 16.3*  HCT 45.9  MCV 93.9  PLT 212   Basic Metabolic Panel:  Recent Labs Lab 07/08/16 2035  NA 135  K 4.0  CL 103  CO2 21*  GLUCOSE 135*  BUN 10  CREATININE 0.86  CALCIUM 9.2   GFR: Estimated Creatinine Clearance: 82.2 mL/min (by C-G formula based on SCr of 0.86 mg/dL).  Radiological Exams on Admission: Dg Chest 2 View  Result Date: 07/08/2016 CLINICAL DATA:  Shortness of breath and cough for 2 days EXAM: CHEST  2 VIEW COMPARISON:  10/01/2015 FINDINGS: Mild interstitial prominence similar compared to previous exam. No acute consolidation or effusion. Heart size is normal. No pneumothorax. IMPRESSION: No focal pulmonary infiltrate or overt pulmonary edema. Electronically Signed   By: Jasmine Pang M.D.   On: 07/08/2016 21:46    EKG: Independently reviewed. Sinus tachycardia.  I do not see significant ST depressions or elevations.  Assessment/Plan Principal Problem:   Moderate persistent asthma with exacerbation Active Problems:   HTN (hypertension), accelerated    Sinus tachycardia   Asthma attack      Acute asthma exacerbation --Place in observation --Continue duonebs q6h scheduled and q2h prn --IV solumedrol 60mg  q6h --Incentive spirometer use --HOLD empiric antibiotics for now --Pulmicort nebulizer BID (substitute for flunisolide) --Formulary substitute for Symbicort  Accelerated HTN --IV hydralazine prn, she is not on BP meds at home  Insomnia --PO Benadryl  Of note, she confirms that she is on Valtrex daily.   DVT prophylaxis: Early ambulation Code Status: FULL Family Communication:  Patient alone at time of admission Disposition Plan: Expect she will go home at discharge Consults called: NONE Admission status: Place in observation   TIME SPENT: 60 minutes   Jerene Bears MD [facility] Pager [phone]  If 7PM-7AM, please contact night-coverage [email or web address] Password [email or web address]  07/09/2016, 1:31 AM

## 2016-07-09 NOTE — Progress Notes (Signed)
Patient requested discontinued use of  Bed Alarm.  Explained risk involved.  She is ambulating to the rest room without assistance with a steady gait.

## 2016-07-10 DIAGNOSIS — J441 Chronic obstructive pulmonary disease with (acute) exacerbation: Secondary | ICD-10-CM

## 2016-07-10 DIAGNOSIS — J45901 Unspecified asthma with (acute) exacerbation: Secondary | ICD-10-CM

## 2016-07-10 MED ORDER — LISINOPRIL 2.5 MG PO TABS: 2.5000 mg | ORAL_TABLET | Freq: Every day | ORAL | 0 refills | Status: DC

## 2016-07-10 MED ORDER — ALBUTEROL SULFATE (2.5 MG/3ML) 0.083% IN NEBU: INHALATION_SOLUTION | RESPIRATORY_TRACT | 1 refills | Status: DC

## 2016-07-10 MED ORDER — ALBUTEROL SULFATE HFA 108 (90 BASE) MCG/ACT IN AERS: 2.0000 | INHALATION_SPRAY | RESPIRATORY_TRACT | 1 refills | Status: DC | PRN

## 2016-07-10 MED ORDER — BUTALBITAL-APAP-CAFFEINE 50-325-40 MG PO TABS
2.0000 | ORAL_TABLET | Freq: Once | ORAL | Status: AC
  Administered 2016-07-10: 2 via ORAL
  Filled 2016-07-10: qty 1
  Filled 2016-07-10: qty 2

## 2016-07-10 MED ORDER — PHENOL 1.4 % MT LIQD
1.0000 | OROMUCOSAL | Status: DC | PRN
  Filled 2016-07-10: qty 177

## 2016-07-10 MED ORDER — PREDNISONE 50 MG PO TABS: 50.0000 mg | ORAL_TABLET | Freq: Every day | ORAL | 0 refills | Status: DC

## 2016-07-10 NOTE — Discharge Instructions (Signed)
Asthma, Acute Bronchospasm °Acute bronchospasm caused by asthma is also referred to as an asthma attack. Bronchospasm means your air passages become narrowed. The narrowing is caused by inflammation and tightening of the muscles in the air tubes (bronchi) in your lungs. This can make it hard to breathe or cause you to wheeze and cough. °What are the causes? °Possible triggers are: °· Animal dander from the skin, hair, or feathers of animals. °· Dust mites contained in house dust. °· Cockroaches. °· Pollen from trees or grass. °· Mold. °· Cigarette or tobacco smoke. °· Air pollutants such as dust, household cleaners, hair sprays, aerosol sprays, paint fumes, strong chemicals, or strong odors. °· Cold air or weather changes. Cold air may trigger inflammation. Winds increase molds and pollens in the air. °· Strong emotions such as crying or laughing hard. °· Stress. °· Certain medicines such as aspirin or beta-blockers. °· Sulfites in foods and drinks, such as dried fruits and wine. °· Infections or inflammatory conditions, such as a flu, cold, or inflammation of the nasal membranes (rhinitis). °· Gastroesophageal reflux disease (GERD). GERD is a condition where stomach acid backs up into your esophagus. °· Exercise or strenuous activity. ° °What are the signs or symptoms? °· Wheezing. °· Excessive coughing, particularly at night. °· Chest tightness. °· Shortness of breath. °How is this diagnosed? °Your health care provider will ask you about your medical history and perform a physical exam. A chest X-ray or blood testing may be performed to look for other causes of your symptoms or other conditions that may have triggered your asthma attack. °How is this treated? °Treatment is aimed at reducing inflammation and opening up the airways in your lungs. Most asthma attacks are treated with inhaled medicines. These include quick relief or rescue medicines (such as bronchodilators) and controller medicines (such as inhaled  corticosteroids). These medicines are sometimes given through an inhaler or a nebulizer. Systemic steroid medicine taken by mouth or given through an IV tube also can be used to reduce the inflammation when an attack is moderate or severe. Antibiotic medicines are only used if a bacterial infection is present. °Follow these instructions at home: °· Rest. °· Drink plenty of liquids. This helps the mucus to remain thin and be easily coughed up. Only use caffeine in moderation and do not use alcohol until you have recovered from your illness. °· Do not smoke. Avoid being exposed to secondhand smoke. °· You play a critical role in keeping yourself in good health. Avoid exposure to things that cause you to wheeze or to have breathing problems. °· Keep your medicines up-to-date and available. Carefully follow your health care provider’s treatment plan. °· Take your medicine exactly as prescribed. °· When pollen or pollution is bad, keep windows closed and use an air conditioner or go to places with air conditioning. °· Asthma requires careful medical care. See your health care provider for a follow-up as advised. If you are more than [redacted] weeks pregnant and you were prescribed any new medicines, let your obstetrician know about the visit and how you are doing. Follow up with your health care provider as directed. °· After you have recovered from your asthma attack, make an appointment with your outpatient doctor to talk about ways to reduce the likelihood of future attacks. If you do not have a doctor who manages your asthma, make an appointment with a primary care doctor to discuss your asthma. °Get help right away if: °· You are getting worse. °·   You have trouble breathing. If severe, call your local emergency services (911 in the [place] °· You develop chest pain or discomfort. °· You are vomiting. °· You are not able to keep fluids down. °· You are coughing up yellow, green, brown, or bloody sputum. °· You have a fever  and your symptoms suddenly get worse. °· You have trouble swallowing. °This information is not intended to replace advice given to you by your health care provider. Make sure you discuss any questions you have with your health care provider. °Document Released: 11/09/2006 Document Revised: 01/06/2016 Document Reviewed: 01/30/2013 °Elsevier Interactive Patient Education © 2017 [employer] ° °

## 2016-07-10 NOTE — Discharge Summary (Addendum)
Physician Discharge Summary  Laura Clay ZOX:[id number] DOB: Jul 10, 1971 DOA: 07/08/2016  PCP: Laura Sidle, MD  Admit date: 07/08/2016 Discharge date: 07/10/2016  Recommendations for Outpatient Follow-up:  1. Patient will continue prednisone 50 mg a day for 5 days. She will also resume Augmentin as per prior to this admission.  Discharge Diagnoses:  Principal Problem:   Moderate persistent asthma with exacerbation Active Problems:   HTN (hypertension), accelerated    Sinus tachycardia   Asthma attack   Moderate persistent asthma with acute exacerbation   Asthma exacerbation    Discharge Condition: stable   Diet recommendation: as tolerated   History of present illness:  45 y.o.female with past medical history of asthma who presented to [facility] ED with worsening shortness of breath, wheezing in past 1-2 days prior to this admission. No fevers.  In ED, she was hypoxic with O2 sats 89% on room air but this has improved to 99% with [place] oxygen support.  Hospital Course:   Assessment and Plan:  Asthma exacerbation - Stable respiratory status - Continue bronchodilators as prescribed - Continue prednisone 50 mg a day for 5 days on discharge - Patient can resume Augmentin which she was taking prior to this admission  Signed:  Manson Passey, MD  [facility] 07/10/2016, 8:39 AM  Pager #: [phone]  Time spent in minutes: less than 30 minutes    Discharge Exam: Vitals:   07/09/16 2338 07/10/16 0416  BP: 139/82 (!) 143/92  Pulse:  (!) 102  Resp:  18  Temp:  98.5 F (36.9 C)   Vitals:   07/09/16 2338 07/10/16 0416 07/10/16 0747 07/10/16 0748  BP: 139/82 (!) 143/92    Pulse:  (!) 102    Resp:  18    Temp:  98.5 F (36.9 C)    TempSrc:  Oral    SpO2:  95% 97% 98%  Weight:      Height:        General: Pt is alert, follows commands appropriately, not in acute distress Cardiovascular: Regular rate and rhythm, S1/S2 +, no murmurs Respiratory: Still  wheezing but better since yesterday, slightly rhonchorous in upper lung lobes Abdominal: Soft, non tender, non distended, bowel sounds +, no guarding Extremities: no edema, no cyanosis, pulses palpable bilaterally DP and PT Neuro: Grossly nonfocal  Discharge Instructions  Discharge Instructions    Call MD for:  persistant nausea and vomiting    Complete by:  As directed    Call MD for:  redness, tenderness, or signs of infection (pain, swelling, redness, odor or green/yellow discharge around incision site)    Complete by:  As directed    Call MD for:  severe uncontrolled pain    Complete by:  As directed    Diet - low sodium heart healthy    Complete by:  As directed    Discharge instructions    Complete by:  As directed    Continue prednisone 50 mg a day for 5 days on discharge. Continue bronchodilators as prescribed.   Increase activity slowly    Complete by:  As directed        Medication List    TAKE these medications   AEROSPAN 80 MCG/ACT Aers Generic drug:  Flunisolide HFA Inhale 2 puffs into the lungs daily as needed (sob and wheezing).   albuterol 108 (90 Base) MCG/ACT inhaler Commonly known as:  PROAIR HFA Inhale 2 puffs into the lungs every 4 (four) hours as needed for wheezing or shortness of breath.  What changed:  Another medication with the same name was changed. Make sure you understand how and when to take each.   albuterol (2.5 MG/3ML) 0.083% nebulizer solution Commonly known as:  PROVENTIL USE ONE VIAL IN NEBULIZER EVERY 4 TO 6 HOURS AS NEEDED FOR COUGH OR WHEEZING What changed:  See the new instructions.   amoxicillin-clavulanate 875-125 MG tablet Commonly known as:  AUGMENTIN Take one tablet twice daily for 14 days   BENADRYL 25 MG tablet Generic drug:  diphenhydrAMINE Take 50 mg by mouth at bedtime.   budesonide-formoterol 160-4.5 MCG/ACT inhaler Commonly known as:  SYMBICORT Inhale 2 puffs into the lungs 2 (two) times daily.   DELSYM 30 MG/5ML  liquid Generic drug:  dextromethorphan Take 30 mg by mouth 2 (two) times daily as needed for cough.   desonide 0.05 % cream Commonly known as:  DESOWEN Apply topically 2 (two) times daily.   ibuprofen 200 MG tablet Commonly known as:  ADVIL,MOTRIN Take 600 mg by mouth every 6 (six) hours as needed for headache.   mometasone 50 MCG/ACT nasal spray Commonly known as:  NASONEX Place 1 spray into the nose daily as needed (allergies).   MUCINEX DM PO Take 1 tablet by mouth 2 (two) times daily.   multivitamin tablet Take 1 tablet by mouth daily.   omeprazole 20 MG capsule Commonly known as:  PRILOSEC Take 20 mg by mouth daily.   predniSONE 50 MG tablet Commonly known as:  DELTASONE Take 1 tablet (50 mg total) by mouth daily with breakfast. What changed:  medication strength  how much to take  how to take this  when to take this  additional instructions   RHINOCORT ALLERGY 32 MCG/ACT nasal spray Generic drug:  budesonide Place 2 sprays into both nostrils daily.   VALTREX 1000 MG tablet Generic drug:  valACYclovir Take 1,000 mg by mouth daily.       Follow-up Information    Laura SidleKurt Lauenstein, MD. Schedule an appointment as soon as possible for a visit in 2 week(s).   Specialty:  Family Medicine Contact information: [facility] [id number] [facility] [address], [address]   [place]  [place] [phone]            The results of significant diagnostics from this hospitalization (including imaging, microbiology, ancillary and laboratory) are listed below for reference.    Significant Diagnostic Studies: Dg Chest 2 View  Result Date: 07/08/2016 CLINICAL DATA:  Shortness of breath and cough for 2 days EXAM: CHEST  2 VIEW COMPARISON:  10/01/2015 FINDINGS: Mild interstitial prominence similar compared to previous exam. No acute consolidation or effusion. Heart size is normal. No pneumothorax. IMPRESSION: No focal pulmonary infiltrate or overt pulmonary edema.  Electronically Signed   By: Jasmine PangKim  Fujinaga M.D.   On: 07/08/2016 21:46    Microbiology: No results found for this or any previous visit (from the past 240 hour(s)).   Labs: Basic Metabolic Panel:  Recent Labs Lab 07/08/16 2035 07/09/16 0412  NA 135 136  K 4.0 3.8  CL 103 105  CO2 21* 15*  GLUCOSE 135* 187*  BUN 10 9  CREATININE 0.86 0.82  CALCIUM 9.2 8.2*   Liver Function Tests: No results for input(s): AST, ALT, ALKPHOS, BILITOT, PROT, ALBUMIN in the last 168 hours. No results for input(s): LIPASE, AMYLASE in the last 168 hours. No results for input(s): AMMONIA in the last 168 hours. CBC:  Recent Labs Lab 07/08/16 2035 07/09/16 0412  WBC 15.9* 14.3*  NEUTROABS 13.2*  --  HGB 16.3* 14.3  HCT 45.9 42.6  MCV 93.9 97.0  PLT 212 216   Cardiac Enzymes: No results for input(s): CKTOTAL, CKMB, CKMBINDEX, TROPONINI in the last 168 hours. BNP: BNP (last 3 results) No results for input(s): BNP in the last 8760 hours.  ProBNP (last 3 results) No results for input(s): PROBNP in the last 8760 hours.  CBG: No results for input(s): GLUCAP in the last 168 hours.

## 2016-07-19 ENCOUNTER — Ambulatory Visit: Payer: 59 | Admitting: Allergy and Immunology

## 2016-08-16 ENCOUNTER — Ambulatory Visit: Payer: 59 | Admitting: Allergy and Immunology

## 2016-08-26 ENCOUNTER — Other Ambulatory Visit: Payer: Self-pay | Admitting: Allergy and Immunology

## 2016-09-09 ENCOUNTER — Other Ambulatory Visit: Payer: Self-pay | Admitting: *Deleted

## 2016-09-09 ENCOUNTER — Telehealth: Payer: Self-pay | Admitting: Allergy and Immunology

## 2016-09-09 MED ORDER — PREDNISONE 10 MG PO TABS: ORAL_TABLET | ORAL | 0 refills | Status: DC

## 2016-09-09 MED ORDER — BUDESONIDE-FORMOTEROL FUMARATE 160-4.5 MCG/ACT IN AERO: 2.0000 | INHALATION_SPRAY | Freq: Two times a day (BID) | RESPIRATORY_TRACT | 5 refills | Status: DC

## 2016-09-09 NOTE — Telephone Encounter (Signed)
Pt called and needs to have symbicort sent in and she would like to get some predisone . [facility] [employer]. [phone]

## 2016-09-09 NOTE — Telephone Encounter (Signed)
Please provide patient prescription for Symbicort 160-2 inhalations twice a day and please provide her with samples of Symbicort and prednisone 20 mg a day for 5 days. [place] next week if she is having a problem.

## 2016-09-09 NOTE — Telephone Encounter (Signed)
Patient informed and RX sent.  

## 2016-09-20 ENCOUNTER — Ambulatory Visit: Payer: 59 | Admitting: Allergy and Immunology

## 2016-10-18 ENCOUNTER — Ambulatory Visit: Payer: 59 | Admitting: Allergy and Immunology

## 2016-11-08 ENCOUNTER — Encounter: Payer: Self-pay | Admitting: Allergy and Immunology

## 2016-11-08 ENCOUNTER — Ambulatory Visit (INDEPENDENT_AMBULATORY_CARE_PROVIDER_SITE_OTHER): Payer: 59 | Admitting: Allergy and Immunology

## 2016-11-08 VITALS — BP 220/130 | HR 88 | Resp 16

## 2016-11-08 DIAGNOSIS — I10 Essential (primary) hypertension: Secondary | ICD-10-CM

## 2016-11-08 DIAGNOSIS — J309 Allergic rhinitis, unspecified: Secondary | ICD-10-CM

## 2016-11-08 DIAGNOSIS — H1010 Acute atopic conjunctivitis, unspecified eye: Secondary | ICD-10-CM | POA: Diagnosis not present

## 2016-11-08 DIAGNOSIS — J4541 Moderate persistent asthma with (acute) exacerbation: Secondary | ICD-10-CM

## 2016-11-08 DIAGNOSIS — K219 Gastro-esophageal reflux disease without esophagitis: Secondary | ICD-10-CM | POA: Diagnosis not present

## 2016-11-08 MED ORDER — METHYLPREDNISOLONE ACETATE 80 MG/ML IJ SUSP
80.0000 mg | Freq: Once | INTRAMUSCULAR | Status: AC
  Administered 2016-11-08: 80 mg via INTRAMUSCULAR

## 2016-11-08 NOTE — Patient Instructions (Addendum)
  1. Depomedrol 80 IM delivered in clinic today  2. Symbicort 160 two inhalations two times per day    3. Continue OTC Rhinocort / Nasacort one spray each nostril 1 time per day   4. Continue omeprazole 20 one tablet one time per day  5. Use ProAir Respiclick (Coupon) 2 inhalations every 4-6 hours or albuterol nebulization every 4-6 hours if needed.  6. Use OTC antihistamine if needed  7. Return in 6 months or earlier if problem  8. Have BP issue checked ASAP

## 2016-11-08 NOTE — Progress Notes (Signed)
Follow-up Note  Referring Provider: Elvina Sidle, MD Primary Provider: Elvina Sidle, MD Date of Office Visit: 11/08/2016  Subjective:   Laura Clay (DOB: 1971-05-04) is a 46 y.o. female [facility] in re-evaluation of the following:  HPI: Laura Clay presents to this clinic in reevaluation of her asthma and allergic rhinoconjunctivitis and reflux. I have not seen her in this clinic since November 2017.  She was doing wonderful since that last visit without a requirement for systemic steroid or an antibiotic and rarely had any problems with her asthma and could exercise without any difficulty and rarely uses short acting bronchodilator. Likewise, her nose has really been doing quite well and she was able to obtain all this benefit while consistently using Symbicort and a nasal steroid. In addition, her reflux is under excellent control while using her omeprazole.  Unfortunately, this week she was outdoors for a bicycle ride for several hours and developed coughing and wheezing and chest congestion fortunately without any nasal issues. As well, reflux been under good control. She has not had any associated fever or ugly nasal discharge or ugly sputum production or chest pain. She has had to use her short-acting bronchodilator multiple times.  She did obtain the flu vaccine this fall.  Allergies as of 11/08/2016      Reactions   Dilaudid [hydromorphone Hcl] Shortness Of Breath, Swelling, Other (See Comments)   Redness in hands and thorat.   Morphine    REACTION: per pt gives her hives      Medication List      PROAIR RESPICLICK 108 (90 Base) MCG/ACT Aepb Generic drug:  Albuterol Sulfate Inhale two doses every four to six hours as needed for cough or wheeze.   albuterol (2.5 MG/3ML) 0.083% nebulizer solution Commonly known as:  PROVENTIL USE ONE VIAL IN NEBULIZER EVERY 4 TO 6 HOURS AS NEEDED FOR COUGH OR WHEEZING   BENADRYL 25 MG  tablet Generic drug:  diphenhydrAMINE Take 50 mg by mouth at bedtime.   budesonide-formoterol 160-4.5 MCG/ACT inhaler Commonly known as:  SYMBICORT Inhale 2 puffs into the lungs 2 (two) times daily.   ibuprofen 200 MG tablet Commonly known as:  ADVIL,MOTRIN Take 600 mg by mouth every 6 (six) hours as needed for headache.   multivitamin tablet Take 1 tablet by mouth daily.   NASACORT ALLERGY 24HR 55 MCG/ACT Aero nasal inhaler Generic drug:  triamcinolone Place 1 spray into the nose daily.   omeprazole 20 MG capsule Commonly known as:  PRILOSEC Take 20 mg by mouth daily.   VALTREX 1000 MG tablet Generic drug:  valACYclovir Take 1,000 mg by mouth daily.       Past Medical History:  Diagnosis Date  . Anxiety   . Asthma   . Depression   . GERD (gastroesophageal reflux disease)   . Hypertension   . Kidney stones     Past Surgical History:  Procedure Laterality Date  . ABDOMINAL HYSTERECTOMY  2000   Abnormal PAPs  . BUNIONECTOMY Left   . OVARIAN CYST REMOVAL      Review of systems negative except as noted in HPI / PMHx or noted below:  Review of Systems  Constitutional: Negative.   HENT: Negative.   Eyes: Negative.   Respiratory: Negative.   Cardiovascular: Negative.   Gastrointestinal: Negative.   Genitourinary: Negative.   Musculoskeletal: Negative.   Skin: Negative.   Neurological: Negative.   Endo/Heme/Allergies: Negative.   Psychiatric/Behavioral: Negative.  Objective:   Vitals:   11/08/16 1653  BP: (!) 220/130  Pulse: 88  Resp: 16          Physical Exam  Constitutional: She is well-developed, well-nourished, and in no distress.  HENT:  Head: Normocephalic.  Right Ear: Tympanic membrane, external ear and ear canal normal.  Left Ear: Tympanic membrane, external ear and ear canal normal.  Nose: Nose normal. No mucosal edema or rhinorrhea.  Mouth/Throat: Uvula is midline, oropharynx is clear and moist and mucous membranes are normal.  No oropharyngeal exudate.  Eyes: Conjunctivae are normal.  Neck: Trachea normal. No tracheal tenderness present. No tracheal deviation present. No thyromegaly present.  Cardiovascular: Normal rate, regular rhythm, S1 normal, S2 normal and normal heart sounds.   No murmur heard. Pulmonary/Chest: No stridor. No respiratory distress. She has wheezes (end expiratory wheezes posterior lung fields). She has no rales.  Musculoskeletal: She exhibits no edema.  Lymphadenopathy:       Head (right side): No tonsillar adenopathy present.       Head (left side): No tonsillar adenopathy present.    She has no cervical adenopathy.  Neurological: She is alert. Gait normal.  Skin: No rash noted. She is not diaphoretic. No erythema. Nails show no clubbing.  Psychiatric: Mood and affect normal.    Diagnostics:    Spirometry was performed and demonstrated an FEV1 of 2.24 at 81 % of predicted.  Assessment and Plan:   1. Asthma, not well controlled, moderate persistent, with acute exacerbation   2. Allergic rhinoconjunctivitis   3. Gastroesophageal reflux disease, esophagitis presence not specified   4. Essential hypertension     1. Depomedrol 80 IM delivered in clinic today  2. Symbicort 160 two inhalations two times per day    3. Continue OTC Rhinocort / Nasacort one spray each nostril 1 time per day   4. Continue omeprazole 20 one tablet one time per day  5. Use ProAir Respiclick (Coupon) 2 inhalations every 4-6 hours or albuterol nebulization every 4-6 hours if needed.  6. Use OTC antihistamine if needed  7. Return in 6 months or earlier if problem  8. Have BP issue checked ASAP  Joby I will use a systemic steroid in addition to her other anti-inflammatory agents for her respiratory tract and continue on therapy for reflux and I will assume that she'll do well through the spring and other seasons of the year and see her back in this clinic in 6 months or earlier if there is a problem. She  will contact me should she notice significant problems that she moves forward. She did have a history of respiratory tract failure in the past requiring intubation and she now is very aware when she is developing significant problems with her respiratory tract. As well, she obviously has an elevated blood pressure which has been a problem in the past and she basically ignores this issue and I had a long talk with her today about the need to address this issue with her primary care doctor ASAP. She needs to contact them and tell them what her blood pressure was today and asked them to give her a form of therapy that will keep this issue under better control.  Laurette Schimke, MD Allergy / Immunology [facility]

## 2016-11-17 ENCOUNTER — Encounter: Payer: Self-pay | Admitting: Physician Assistant

## 2016-11-17 ENCOUNTER — Other Ambulatory Visit: Payer: Self-pay | Admitting: *Deleted

## 2016-11-18 ENCOUNTER — Encounter: Payer: Self-pay | Admitting: Physician Assistant

## 2016-11-18 ENCOUNTER — Ambulatory Visit (INDEPENDENT_AMBULATORY_CARE_PROVIDER_SITE_OTHER): Payer: 59 | Admitting: Physician Assistant

## 2016-11-18 VITALS — BP 200/140 | HR 105 | Ht 64.0 in | Wt 162.0 lb

## 2016-11-18 DIAGNOSIS — I10 Essential (primary) hypertension: Secondary | ICD-10-CM

## 2016-11-18 LAB — CBC WITH DIFFERENTIAL/PLATELET
BASOS ABS: 0 {cells}/uL (ref 0–200)
Basophils Relative: 0 %
EOS ABS: 390 {cells}/uL (ref 15–500)
Eosinophils Relative: 5 %
HEMATOCRIT: 45 % (ref 35.0–45.0)
HEMOGLOBIN: 15.6 g/dL — AB (ref 11.7–15.5)
LYMPHS ABS: 3588 {cells}/uL (ref 850–3900)
Lymphocytes Relative: 46 %
MCH: 33 pg (ref 27.0–33.0)
MCHC: 34.7 g/dL (ref 32.0–36.0)
MCV: 95.1 fL (ref 80.0–100.0)
MPV: 8.7 fL (ref 7.5–12.5)
Monocytes Absolute: 546 cells/uL (ref 200–950)
Monocytes Relative: 7 %
NEUTROS ABS: 3276 {cells}/uL (ref 1500–7800)
NEUTROS PCT: 42 %
Platelets: 244 10*3/uL (ref 140–400)
RBC: 4.73 MIL/uL (ref 3.80–5.10)
RDW: 13.1 % (ref 11.0–15.0)
WBC: 7.8 10*3/uL (ref 3.8–10.8)

## 2016-11-18 LAB — COMPREHENSIVE METABOLIC PANEL
ALBUMIN: 4.3 g/dL (ref 3.6–5.1)
ALT: 34 U/L — ABNORMAL HIGH (ref 6–29)
AST: 23 U/L (ref 10–30)
Alkaline Phosphatase: 61 U/L (ref 33–115)
BILIRUBIN TOTAL: 0.4 mg/dL (ref 0.2–1.2)
BUN: 10 mg/dL (ref 7–25)
CALCIUM: 9.3 mg/dL (ref 8.6–10.2)
CHLORIDE: 105 mmol/L (ref 98–110)
CO2: 22 mmol/L (ref 20–31)
Creat: 0.91 mg/dL (ref 0.50–1.10)
Glucose, Bld: 97 mg/dL (ref 65–99)
Potassium: 4.1 mmol/L (ref 3.5–5.3)
Sodium: 139 mmol/L (ref 135–146)
TOTAL PROTEIN: 7.3 g/dL (ref 6.1–8.1)

## 2016-11-18 MED ORDER — LISINOPRIL-HYDROCHLOROTHIAZIDE 10-12.5 MG PO TABS: 1.0000 | ORAL_TABLET | Freq: Every day | ORAL | 0 refills | Status: DC

## 2016-11-18 NOTE — Assessment & Plan Note (Signed)
EKG tracing is personally reviewed.  EKG notes NSR.  No acute changes. Will order routine labs. Discussed with patient that she needs to monitor BP daily at home, keep a record so we can review at next visit. Start Lisinopril-HCTZ 10-12.5. Follow-up in 2 weeks. If any change in symptoms, such as severe HA, blurred vision, chest pain or SOB, patient should go to the emergency room.

## 2016-11-18 NOTE — Progress Notes (Signed)
Pre visit review using our clinic review tool, if applicable. No additional management support is needed unless otherwise documented below in the visit note. 

## 2016-11-18 NOTE — Patient Instructions (Signed)
It was great meeting you today!  Please start your blood pressure medication. Please check your blood pressure at least daily and record for [place] to review at your next visit. I would like for you to see [place] in 2 weeks so we can re-check your blood pressure in the office and adjust medications as needed.  Stop adding any additional salt to foods. Stop all diet pills. Limit caffeine and stress.  If you develop chest pain or shortness of breath, or a sudden severe headache or vision changes --> GO TO THE EMERGENCY ROOM!   Hypertension Hypertension, commonly called high blood pressure, is when the force of blood pumping through the arteries is too strong. The arteries are the blood vessels that carry blood from the heart throughout the body. Hypertension forces the heart to work harder to pump blood and may cause arteries to become narrow or stiff. Having untreated or uncontrolled hypertension can cause heart attacks, strokes, kidney disease, and other problems. A blood pressure reading consists of a higher number over a lower number. Ideally, your blood pressure should be below 120/80. The first ("top") number is called the systolic pressure. It is a measure of the pressure in your arteries as your heart beats. The second ("bottom") number is called the diastolic pressure. It is a measure of the pressure in your arteries as the heart relaxes. What are the causes? The cause of this condition is not known. What increases the risk? Some risk factors for high blood pressure are under your control. Others are not. Factors you can change   Smoking.  Having type 2 diabetes mellitus, high cholesterol, or both.  Not getting enough exercise or physical activity.  Being overweight.  Having too much fat, sugar, calories, or salt (sodium) in your diet.  Drinking too much alcohol. Factors that are difficult or impossible to change   Having chronic kidney disease.  Having a family history of high blood  pressure.  Age. Risk increases with age.  Race. You may be at higher risk if you are African-American.  Gender. Men are at higher risk than women before age 4. After age 62, women are at higher risk than men.  Having obstructive sleep apnea.  Stress. What are the signs or symptoms? Extremely high blood pressure (hypertensive crisis) may cause:  Headache.  Anxiety.  Shortness of breath.  Nosebleed.  Nausea and vomiting.  Severe chest pain.  Jerky movements you cannot control (seizures). How is this diagnosed? This condition is diagnosed by measuring your blood pressure while you are seated, with your arm resting on a surface. The cuff of the blood pressure monitor will be placed directly against the skin of your upper arm at the level of your heart. It should be measured at least twice using the same arm. Certain conditions can cause a difference in blood pressure between your right and left arms. Certain factors can cause blood pressure readings to be lower or higher than normal (elevated) for a short period of time:  When your blood pressure is higher when you are in a health care provider's office than when you are at home, this is called white coat hypertension. Most people with this condition do not need medicines.  When your blood pressure is higher at home than when you are in a health care provider's office, this is called masked hypertension. Most people with this condition may need medicines to control blood pressure. If you have a high blood pressure reading during one visit  or you have normal blood pressure with other risk factors:  You may be asked to return on a different day to have your blood pressure checked again.  You may be asked to monitor your blood pressure at home for 1 week or longer. If you are diagnosed with hypertension, you may have other blood or imaging tests to help your health care provider understand your overall risk for other conditions. How  is this treated? This condition is treated by making healthy lifestyle changes, such as eating healthy foods, exercising more, and reducing your alcohol intake. Your health care provider may prescribe medicine if lifestyle changes are not enough to get your blood pressure under control, and if:  Your systolic blood pressure is above 130.  Your diastolic blood pressure is above 80. Your personal target blood pressure may vary depending on your medical conditions, your age, and other factors. Follow these instructions at home: Eating and drinking   Eat a diet that is high in fiber and potassium, and low in sodium, added sugar, and fat. An example eating plan is called the DASH (Dietary Approaches to Stop Hypertension) diet. To eat this way:  Eat plenty of fresh fruits and vegetables. Try to fill half of your plate at each meal with fruits and vegetables.  Eat whole grains, such as whole wheat pasta, brown rice, or whole grain bread. Fill about one quarter of your plate with whole grains.  Eat or drink low-fat dairy products, such as skim milk or low-fat yogurt.  Avoid fatty cuts of meat, processed or cured meats, and poultry with skin. Fill about one quarter of your plate with lean proteins, such as fish, chicken without skin, beans, eggs, and tofu.  Avoid premade and processed foods. These tend to be higher in sodium, added sugar, and fat.  Reduce your daily sodium intake. Most people with hypertension should eat less than 1,500 mg of sodium a day.  Limit alcohol intake to no more than 1 drink a day for nonpregnant women and 2 drinks a day for men. One drink equals 12 oz of beer, 5 oz of wine, or 1 oz of hard liquor. Lifestyle   Work with your health care provider to maintain a healthy body weight or to lose weight. Ask what an ideal weight is for you.  Get at least 30 minutes of exercise that causes your heart to beat faster (aerobic exercise) most days of the week. Activities may  include walking, swimming, or biking.  Include exercise to strengthen your muscles (resistance exercise), such as pilates or lifting weights, as part of your weekly exercise routine. Try to do these types of exercises for 30 minutes at least 3 days a week.  Do not use any products that contain nicotine or tobacco, such as cigarettes and e-cigarettes. If you need help quitting, ask your health care provider.  Monitor your blood pressure at home as told by your health care provider.  Keep all follow-up visits as told by your health care provider. This is important. Medicines   Take over-the-counter and prescription medicines only as told by your health care provider. Follow directions carefully. Blood pressure medicines must be taken as prescribed.  Do not skip doses of blood pressure medicine. Doing this puts you at risk for problems and can make the medicine less effective.  Ask your health care provider about side effects or reactions to medicines that you should watch for. Contact a health care provider if:  You think you are  having a reaction to a medicine you are taking.  You have headaches that keep coming back (recurring).  You feel dizzy.  You have swelling in your ankles.  You have trouble with your vision. Get help right away if:  You develop a severe headache or confusion.  You have unusual weakness or numbness.  You feel faint.  You have severe pain in your chest or abdomen.  You vomit repeatedly.  You have trouble breathing. Summary  Hypertension is when the force of blood pumping through your arteries is too strong. If this condition is not controlled, it may put you at risk for serious complications.  Your personal target blood pressure may vary depending on your medical conditions, your age, and other factors. For most people, a normal blood pressure is less than 120/80.  Hypertension is treated with lifestyle changes, medicines, or a combination of both.  Lifestyle changes include weight loss, eating a healthy, low-sodium diet, exercising more, and limiting alcohol. This information is not intended to replace advice given to you by your health care provider. Make sure you discuss any questions you have with your health care provider. Document Released: 07/25/2005 Document Revised: 06/22/2016 Document Reviewed: 06/22/2016 Elsevier Interactive Patient Education  2017 [employer].

## 2016-11-18 NOTE — Progress Notes (Signed)
Laura Clay is a 46 y.o. female here for a new problem.  Clovis Cao, CMA acted as a Neurosurgeon for [employer], [place].  History of Present Illness:   Chief Complaint  Patient presents with  . Establish Care    [employer]  . Hypertension    Years ago she took Lisinopril HCTZ 12.5 with good results. Reading from Allergist on 11/08/2016 was 220/130. She is not monitoring BP at home. Reports frequent headaches. Denies any SOB, chest pain, dizziness, or weakness.     Acute Concerns: HTN -- patient has a history of HTN, was dx 3-5 years ago and was put on lisinopril-HCTZ, she states that after some time she was able to come off of the medication and stabilize her BP with diet and exercise. Prior to this, she does state that she was sent to Dr. Caryn Section for nephrology because she was told that maybe her BP was secondary to renal issues, but was told by MD that it wasn't from this (states that she may have had a renal ultrasound and other renal work-up, states all of this was negative). During the month of March, she took "[employer] Diet Pills" and stopped taking them about 2 weeks ago. She said that she went to her allergist appointment on 11/08/16 and BP was found to be 220/130 and told to follow up with her PCP to discuss her BP. She reports that she  doesn't check her blood pressure at home. Denies chest pain, SOB, lower leg swelling. She drinks about 20oz of coffee daily, no energy drinks or sodas with caffeine. She does snore at night, is unsure if she has apneic episodes. She tries to limit salt but does eat out often and "just bought a new bottle of Mrs. Dash."  PMHx, SurgHx, SocialHx, Medications, and Allergies were reviewed in the Visit Navigator and updated as appropriate.  Current Medications:   Current Outpatient Prescriptions:  .  albuterol (PROVENTIL HFA;VENTOLIN HFA) 108 (90 Base) MCG/ACT inhaler, Inhale 2 puffs into the lungs every 6 (six) hours as needed for wheezing or  shortness of breath., Disp: , Rfl:  .  albuterol (PROVENTIL) (2.5 MG/3ML) 0.083% nebulizer solution, USE ONE VIAL IN NEBULIZER EVERY 4 TO 6 HOURS AS NEEDED FOR COUGH OR WHEEZING, Disp: 50 vial, Rfl: 1 .  diphenhydrAMINE (BENADRYL) 25 MG tablet, Take 50 mg by mouth at bedtime., Disp: , Rfl:  .  ibuprofen (ADVIL,MOTRIN) 200 MG tablet, Take 600 mg by mouth every 6 (six) hours as needed for headache. , Disp: , Rfl:  .  Multiple Vitamin (MULTIVITAMIN) tablet, Take 1 tablet by mouth daily., Disp: , Rfl:  .  omeprazole (PRILOSEC) 20 MG capsule, Take 20 mg by mouth daily. , Disp: , Rfl:  .  SYMBICORT 160-4.5 MCG/ACT inhaler, Inhale 2 puffs into the lungs 2 (two) times daily. , Disp: , Rfl:  .  triamcinolone (NASACORT ALLERGY 24HR) 55 MCG/ACT AERO nasal inhaler, Place 1 spray into the nose daily., Disp: , Rfl:  .  valACYclovir (VALTREX) 1000 MG tablet, Take 500 mg by mouth daily., Disp: , Rfl:  .  lisinopril-hydrochlorothiazide (PRINZIDE,ZESTORETIC) 10-12.5 MG tablet, Take 1 tablet by mouth daily., Disp: 30 tablet, Rfl: 0   Review of Systems:   Review of Systems  Constitutional: Negative.   HENT: Negative.   Eyes: Negative.   Respiratory: Negative.   Cardiovascular: Positive for palpitations.  Gastrointestinal: Negative.   Genitourinary: Negative.   Musculoskeletal: Negative.   Skin: Negative.   Neurological: Positive  for headaches.  Endo/Heme/Allergies: Negative.   Psychiatric/Behavioral: Negative.     Vitals:   Vitals:   11/18/16 1445 11/18/16 1625  BP: (!) 210/140 (!) 200/140  Pulse: (!) 105   SpO2: 99%   Weight: 162 lb (73.5 kg)   Height:  (1.626 m)      Body mass index is 27.81 kg/m.  Physical Exam:   Physical Exam  Constitutional: She appears well-developed. She is cooperative.  Non-toxic appearance. She does not have a sickly appearance. She does not appear ill. No distress.  Eyes:  Fundoscopic exam:      The right eye shows no hemorrhage. The right eye shows red  reflex.       The left eye shows no hemorrhage. The left eye shows red reflex.  Cardiovascular: Normal rate, regular rhythm, S1 normal, S2 normal, normal heart sounds and normal pulses.   No LE edema  Pulmonary/Chest: Effort normal and breath sounds normal.  Neurological: She is alert.  Nursing note and vitals reviewed.   EKG tracing is personally reviewed.  EKG notes NSR.  No acute changes.   Assessment and Plan:    Problem List Items Addressed This Visit      Cardiovascular and Mediastinum   HTN (hypertension), accelerated  - Primary (Chronic)    EKG tracing is personally reviewed.  EKG notes NSR.  No acute changes. Will order routine labs. Discussed with patient that she needs to monitor BP daily at home, keep a record so we can review at next visit. Start Lisinopril-HCTZ 10-12.5. Follow-up in 2 weeks. If any change in symptoms, such as severe HA, blurred vision, chest pain or SOB, patient should go to the emergency room.      Relevant Medications   lisinopril-hydrochlorothiazide (PRINZIDE,ZESTORETIC) 10-12.5 MG tablet   Other Relevant Orders   EKG 12-Lead (Completed)   T3, free   TSH   CBC with Differential/Platelet   Comprehensive metabolic panel   Microalbumin / creatinine urine ratio      . Reviewed expectations re: course of current medical issues. . Discussed self-management of symptoms. . Outlined signs and symptoms indicating need for more acute intervention. . Patient verbalized understanding and all questions were answered. . See orders for this visit as documented in the electronic medical record. . Patient received an After-Visit Summary.  CMA or LPN served as scribe during this visit. History, Physical, and Plan performed by medical provider. Documentation and orders reviewed and attested to.  Jarold Motto, PA-C

## 2016-11-19 LAB — MICROALBUMIN / CREATININE URINE RATIO
CREATININE, URINE: 47 mg/dL (ref 20–320)
MICROALB UR: 0.4 mg/dL
MICROALB/CREAT RATIO: 9 ug/mg{creat} (ref <30)

## 2016-11-19 LAB — T3, FREE: T3 FREE: 3 pg/mL (ref 2.3–4.2)

## 2016-11-19 LAB — TSH: TSH: 0.91 mIU/L

## 2016-12-08 NOTE — Progress Notes (Signed)
Laura Clay is a 46 y.o. female is here for 2 week follow up on Hypertension.  I acted as a Neurosurgeonscribe for [employer] Claron Rosencrans, PA-C Laura Mullonna Orphanos, LPN  History of Present Illness:   Chief Complaint  Patient presents with  . Follow-up    2 week  . Hypertension    Bp still elevate running 173-181/ 127-135  . Headache    less frequent    HPI   Patient is here for blood pressure follow-up. Blood pressures at home have been 173-181/127-135. She has a blood pressure monitor at home that she has been using daily. She has been taking her Lisinopril-HCTZ daily. She states that she has been having fewer headaches since starting this medication but has noticed that she has been having some hot flashes. Denies SOB, chest pain, lower leg swelling.  BP Readings from Last 3 Encounters:  12/09/16 (!) 180/130  11/18/16 (!) 200/140  11/08/16 (!) 220/130     Health Maintenance Due  Topic Date Due  . HIV Screening  01/24/1988    PMHx, SurgHx, SocialHx, FamHx, Medications, and Allergies were reviewed in the Visit Navigator and updated as appropriate.   Patient Active Problem List   Diagnosis Date Noted  . Sinus tachycardia 07/09/2016  . Moderate persistent asthma with acute exacerbation   . Status asthmaticus 10/01/2015  . Leukocytosis 10/01/2015  . Hyperglycemia 10/01/2015  . Depression with anxiety 06/22/2012  . HTN (hypertension), accelerated  05/08/2012    Social History  Substance Use Topics  . Smoking status: Former Smoker    Years: 12.00  . Smokeless tobacco: Never Used  . Alcohol use Yes     Comment: Socially    Current Medications and Allergies:    Current Outpatient Prescriptions:  .  albuterol (PROVENTIL HFA;VENTOLIN HFA) 108 (90 Base) MCG/ACT inhaler, Inhale 2 puffs into the lungs every 6 (six) hours as needed for wheezing or shortness of breath., Disp: , Rfl:  .  albuterol (PROVENTIL) (2.5 MG/3ML) 0.083% nebulizer solution, USE ONE VIAL IN NEBULIZER EVERY 4 TO  6 HOURS AS NEEDED FOR COUGH OR WHEEZING, Disp: 50 vial, Rfl: 1 .  diphenhydrAMINE (BENADRYL) 25 MG tablet, Take 50 mg by mouth at bedtime., Disp: , Rfl:  .  ibuprofen (ADVIL,MOTRIN) 200 MG tablet, Take 600 mg by mouth every 6 (six) hours as needed for headache. , Disp: , Rfl:  .  Multiple Vitamin (MULTIVITAMIN) tablet, Take 1 tablet by mouth daily., Disp: , Rfl:  .  omeprazole (PRILOSEC) 20 MG capsule, Take 20 mg by mouth daily. , Disp: , Rfl:  .  SYMBICORT 160-4.5 MCG/ACT inhaler, Inhale 2 puffs into the lungs 2 (two) times daily. , Disp: , Rfl:  .  triamcinolone (NASACORT ALLERGY 24HR) 55 MCG/ACT AERO nasal inhaler, Place 1 spray into the nose daily., Disp: , Rfl:  .  valACYclovir (VALTREX) 1000 MG tablet, Take 500 mg by mouth daily., Disp: , Rfl:  .  amLODipine (NORVASC) 5 MG tablet, Take 1 tablet (5 mg total) by mouth daily., Disp: 30 tablet, Rfl: 0 .  lisinopril-hydrochlorothiazide (ZESTORETIC) 20-12.5 MG tablet, Take 1 tablet by mouth daily., Disp: 30 tablet, Rfl: 0   Allergies  Allergen Reactions  . Dilaudid [Hydromorphone Hcl] Shortness Of Breath, Swelling and Other (See Comments)    Redness in hands and thorat.  . Morphine     REACTION: per pt gives her hives    Review of Systems   Review of Systems  Constitutional: Negative for chills, fever and  weight loss.  Respiratory: Negative for shortness of breath.   Cardiovascular: Negative for chest pain, palpitations and leg swelling.  Gastrointestinal: Negative for abdominal pain, diarrhea, heartburn, nausea and vomiting.  Neurological: Positive for headaches. Negative for dizziness.    Vitals:   Vitals:   12/09/16 1508 12/09/16 1530  BP: (!) 190/140 (!) 180/130  Pulse: (!) 104   Temp: 99.5 F (37.5 C)   TempSrc: Oral   SpO2: 97%   Weight: 161 lb (73 kg)   Height: 5\' 4"  (1.626 m)      Body mass index is 27.64 kg/m.   Physical Exam:    Physical Exam  Constitutional: She appears well-developed. She is cooperative.   Non-toxic appearance. She does not have a sickly appearance. She does not appear ill. No distress.  Cardiovascular: Normal rate, regular rhythm, S1 normal, S2 normal, normal heart sounds and normal pulses.   No LE edema  Pulmonary/Chest: Effort normal and breath sounds normal.  Neurological: She is alert.  Nursing note and vitals reviewed.    Assessment and Plan:    Laura Clay was seen today for follow-up, hypertension and headache.  Diagnoses and all orders for this visit:  Essential hypertension  Other orders -     lisinopril-hydrochlorothiazide (ZESTORETIC) 20-12.5 MG tablet; Take 1 tablet by mouth daily. -     amLODipine (NORVASC) 5 MG tablet; Take 1 tablet (5 mg total) by mouth daily.   Blood pressure remains uncontrolled. Will increase Lisinopril-HCTZ to 20-25mg  and add 5mg  Norvasc daily. She will follow-up in 2-4 weeks, sooner if needed. I told her that if her blood pressure remains uncontrolled, we may need to consider cardiology consult. Patient verbalized understanding.   . Reviewed expectations re: course of current medical issues. . Discussed self-management of symptoms. . Outlined signs and symptoms indicating need for more acute intervention. . Patient verbalized understanding and all questions were answered. . See orders for this visit as documented in the electronic medical record. . Patient received an After Visit Summary.  CMA or LPN served as scribe during this visit. History, Physical, and Plan performed by medical provider. Documentation and orders reviewed and attested to.  Jarold Motto, PA-C [place] [place] 12/09/2016  Follow-up: Return in about 3 weeks (around 12/30/2016).

## 2016-12-09 ENCOUNTER — Encounter: Payer: Self-pay | Admitting: Physician Assistant

## 2016-12-09 ENCOUNTER — Ambulatory Visit (INDEPENDENT_AMBULATORY_CARE_PROVIDER_SITE_OTHER): Payer: 59 | Admitting: Physician Assistant

## 2016-12-09 ENCOUNTER — Other Ambulatory Visit: Payer: Self-pay | Admitting: *Deleted

## 2016-12-09 VITALS — BP 180/130 | HR 104 | Temp 99.5°F | Ht 64.0 in | Wt 161.0 lb

## 2016-12-09 DIAGNOSIS — I10 Essential (primary) hypertension: Secondary | ICD-10-CM | POA: Diagnosis not present

## 2016-12-09 MED ORDER — LISINOPRIL-HYDROCHLOROTHIAZIDE 20-12.5 MG PO TABS: 1.0000 | ORAL_TABLET | Freq: Every day | ORAL | 0 refills | Status: DC

## 2016-12-09 MED ORDER — AMLODIPINE BESYLATE 5 MG PO TABS: 5.0000 mg | ORAL_TABLET | Freq: Every day | ORAL | 0 refills | Status: DC

## 2016-12-09 MED ORDER — LISINOPRIL-HYDROCHLOROTHIAZIDE 20-25 MG PO TABS: 1.0000 | ORAL_TABLET | Freq: Every day | ORAL | 0 refills | Status: DC

## 2016-12-09 NOTE — Telephone Encounter (Signed)
Called [facility] pharmacy and spoke to April, told her need to cancel Rx Lisinopril/HCTZ 20-12.5 mg provider wanted pt to have Lisinopril/HCTZ 20-25 mg one tablet daily. April verbalized understanding and will cancel original Rx.

## 2016-12-09 NOTE — Patient Instructions (Addendum)
It was great seeing you!  Let's follow-up in 2-3 weeks. Continue to measure your blood pressure at home.  If you develop severe headache or chest pain, please go to the emergency room.

## 2016-12-30 ENCOUNTER — Ambulatory Visit (INDEPENDENT_AMBULATORY_CARE_PROVIDER_SITE_OTHER): Payer: 59 | Admitting: Physician Assistant

## 2016-12-30 VITALS — BP 142/96 | HR 85

## 2016-12-30 DIAGNOSIS — I10 Essential (primary) hypertension: Secondary | ICD-10-CM

## 2016-12-30 MED ORDER — AMLODIPINE BESYLATE 10 MG PO TABS: 10.0000 mg | ORAL_TABLET | Freq: Every day | ORAL | 0 refills | Status: DC

## 2016-12-30 NOTE — Progress Notes (Signed)
Patient's blood pressure elevated at 152/102 initially.  Per Jarold MottoSamantha Worley, PA, rechecked blood pressure 10 minutes later.  Second BP reading was 142/96.  Per Jarold MottoSamantha Worley, patient was instructed to increase Norvasc to 10 mg daily and return in 2-4 weeks for BP check.  Patient verbalized understanding.  Rx sent to pharmacy for Norvasc 10 mg daily, #30, 0 refills.  Patient scheduled nurse visit for BP check on 01/27/2017.

## 2016-12-30 NOTE — Progress Notes (Signed)
I agree with the information documented by [employer] Agner, CMA.  Jarold MottoSamantha Clarise Chacko PA-C

## 2017-01-09 ENCOUNTER — Other Ambulatory Visit: Payer: Self-pay | Admitting: *Deleted

## 2017-01-09 MED ORDER — LISINOPRIL-HYDROCHLOROTHIAZIDE 20-25 MG PO TABS: 1.0000 | ORAL_TABLET | Freq: Every day | ORAL | 2 refills | Status: DC

## 2017-01-27 ENCOUNTER — Ambulatory Visit (INDEPENDENT_AMBULATORY_CARE_PROVIDER_SITE_OTHER): Payer: 59 | Admitting: Physician Assistant

## 2017-01-27 VITALS — BP 140/110 | HR 86

## 2017-01-27 DIAGNOSIS — I10 Essential (primary) hypertension: Secondary | ICD-10-CM | POA: Diagnosis not present

## 2017-01-27 MED ORDER — VALACYCLOVIR HCL 1 G PO TABS: ORAL_TABLET | ORAL | 11 refills | Status: DC

## 2017-01-27 MED ORDER — SYMBICORT 160-4.5 MCG/ACT IN AERO: 2.0000 | INHALATION_SPRAY | Freq: Two times a day (BID) | RESPIRATORY_TRACT | 3 refills | Status: DC

## 2017-01-27 NOTE — Progress Notes (Signed)
I agree with the plan written by Clovis CaoAutumn McNeil, CMA.  Jarold MottoSamantha Rayelle Armor PA-C 01/27/17

## 2017-01-27 NOTE — Progress Notes (Signed)
Patient is here for blood pressure follow-up. Pt has not been check home blood pressures. She has been taking her Lisinopril-HCTZ and Amlodipine daily.  Hot Flashes and headaches have subsided for the most part.  Denies SOB, chest pain, lower leg swelling.

## 2017-02-02 ENCOUNTER — Other Ambulatory Visit: Payer: Self-pay | Admitting: Physician Assistant

## 2017-03-06 ENCOUNTER — Encounter: Payer: Self-pay | Admitting: *Deleted

## 2017-03-06 ENCOUNTER — Ambulatory Visit (INDEPENDENT_AMBULATORY_CARE_PROVIDER_SITE_OTHER): Payer: 59 | Admitting: *Deleted

## 2017-03-06 VITALS — BP 146/100 | HR 95

## 2017-03-06 DIAGNOSIS — I10 Essential (primary) hypertension: Secondary | ICD-10-CM | POA: Diagnosis not present

## 2017-03-06 NOTE — Patient Instructions (Signed)
Pt to continue medications Amlodipine 10 mg one tablet daily and Lisinopril-HCTZ 20-25 mg one tablet daily as prescribed until further notice for blood pressure control.

## 2017-03-06 NOTE — Progress Notes (Signed)
Pt here for blood pressure check, Pt has been checking blood pressure here and there not everyday, has been running on average 132/98 per pt. Pt taking Amlodipine 10 mg daily and Lisinopril-HCTZ 20-25 mg one tablet daily. Pt denies headache, dizziness, SOB, Chest pain, and no lower leg edema. Pt also said hot flashes are gone. Denies side effects from medications tolerating them well. Told pt I will call her tomorrow and let her know when Lelon MastSamantha wants to see her back and if any changes to medications. Pt verbalized understanding.

## 2017-03-07 NOTE — Progress Notes (Signed)
Reviewed note from [name], LPN.  Please have patient continue current medication regimen.  I would like for her to follow-up with me in 1 month (please schedule this with me, and NOT a RN visit.)  Continue to monitor BP and provide readings at follow-up.  Jarold MottoSamantha Olander Friedl PA-C 03/07/17

## 2017-03-09 ENCOUNTER — Telehealth: Payer: Self-pay | Admitting: *Deleted

## 2017-03-09 NOTE — Telephone Encounter (Signed)
Tried to contact pt, mailbox is full unable to leave message. Will try again later.

## 2017-03-09 NOTE — Telephone Encounter (Signed)
Please have patient continue current medication regimen.  I would like for her to follow-up with me in 1 month (please schedule this with me, and NOT a RN visit.)  Continue to monitor BP and provide readings at follow-up.  Jarold MottoSamantha Worley PA-C 03/07/17

## 2017-03-10 NOTE — Telephone Encounter (Signed)
Tried to contact pt again, voicemail box full unable to leave message. Will try again Monday.

## 2017-03-14 NOTE — Telephone Encounter (Signed)
Spoke to pt, told her Laura Clay would like her to follow up in one month with her. Continue to current medication and monitor blood pressure and bring readings to appointment. Pt verbalized understanding and will schedule follow up.

## 2017-04-06 ENCOUNTER — Telehealth: Payer: Self-pay | Admitting: Physician Assistant

## 2017-04-06 MED ORDER — LISINOPRIL-HYDROCHLOROTHIAZIDE 20-25 MG PO TABS: 1.0000 | ORAL_TABLET | Freq: Every day | ORAL | 0 refills | Status: DC

## 2017-04-06 NOTE — Telephone Encounter (Signed)
Rx refill sent to pharmacy. 

## 2017-04-06 NOTE — Telephone Encounter (Signed)
MEDICATION: lisinopril-hydrochlorothiazide (PRINZIDE,ZESTORETIC) 20-25 MG tablet  [facility]  [facility] [address], [place] - [address] [phone] (Phone) [phone] (Fax)   IS THIS A 90 DAY SUPPLY : no  IS PATIENT OUT OF MEDICATION:yes  IF NOT; HOW MUCH IS LEFT:n/a   LAST APPOINTMENT DATE:03/06/17  NEXT APPOINTMENT DATE:04/11/17  OTHER COMMENTS:    **Let patient know to contact pharmacy at the end of the day to make sure medication is ready. **  ** Please notify patient to allow 48-72 hours to process**  **Encourage patient to contact the pharmacy for refills or they can request refills through [facility]**

## 2017-04-11 ENCOUNTER — Ambulatory Visit (INDEPENDENT_AMBULATORY_CARE_PROVIDER_SITE_OTHER): Payer: 59 | Admitting: Physician Assistant

## 2017-04-11 ENCOUNTER — Encounter: Payer: Self-pay | Admitting: Physician Assistant

## 2017-04-11 VITALS — BP 146/100 | HR 118 | Temp 99.0°F | Ht 64.0 in | Wt 159.5 lb

## 2017-04-11 DIAGNOSIS — Z114 Encounter for screening for human immunodeficiency virus [HIV]: Secondary | ICD-10-CM | POA: Diagnosis not present

## 2017-04-11 DIAGNOSIS — I10 Essential (primary) hypertension: Secondary | ICD-10-CM | POA: Diagnosis not present

## 2017-04-11 LAB — CBC WITH DIFFERENTIAL/PLATELET
BASOS ABS: 0 10*3/uL (ref 0.0–0.1)
Basophils Relative: 0.4 % (ref 0.0–3.0)
EOS ABS: 0.2 10*3/uL (ref 0.0–0.7)
Eosinophils Relative: 2 % (ref 0.0–5.0)
HEMATOCRIT: 45.2 % (ref 36.0–46.0)
Hemoglobin: 15.2 g/dL — ABNORMAL HIGH (ref 12.0–15.0)
LYMPHS ABS: 2.6 10*3/uL (ref 0.7–4.0)
Lymphocytes Relative: 32.9 % (ref 12.0–46.0)
MCHC: 33.7 g/dL (ref 30.0–36.0)
MCV: 99.7 fl (ref 78.0–100.0)
MONO ABS: 0.5 10*3/uL (ref 0.1–1.0)
Monocytes Relative: 6.9 % (ref 3.0–12.0)
NEUTROS ABS: 4.5 10*3/uL (ref 1.4–7.7)
NEUTROS PCT: 57.8 % (ref 43.0–77.0)
PLATELETS: 269 10*3/uL (ref 150.0–400.0)
RBC: 4.53 Mil/uL (ref 3.87–5.11)
RDW: 12.8 % (ref 11.5–15.5)
WBC: 7.8 10*3/uL (ref 4.0–10.5)

## 2017-04-11 LAB — COMPREHENSIVE METABOLIC PANEL
ALT: 29 U/L (ref 0–35)
AST: 23 U/L (ref 0–37)
Albumin: 4.4 g/dL (ref 3.5–5.2)
Alkaline Phosphatase: 61 U/L (ref 39–117)
BUN: 11 mg/dL (ref 6–23)
CALCIUM: 9.4 mg/dL (ref 8.4–10.5)
CHLORIDE: 97 meq/L (ref 96–112)
CO2: 26 meq/L (ref 19–32)
CREATININE: 0.97 mg/dL (ref 0.40–1.20)
GFR: 66.24 mL/min (ref >60.00)
GLUCOSE: 122 mg/dL — AB (ref 70–99)
Potassium: 3.8 mEq/L (ref 3.5–5.1)
SODIUM: 135 meq/L (ref 135–145)
Total Bilirubin: 0.4 mg/dL (ref 0.2–1.2)
Total Protein: 7.1 g/dL (ref 6.0–8.3)

## 2017-04-11 MED ORDER — AMLODIPINE BESYLATE 10 MG PO TABS: 10.0000 mg | ORAL_TABLET | Freq: Every day | ORAL | 0 refills | Status: DC

## 2017-04-11 MED ORDER — LISINOPRIL-HYDROCHLOROTHIAZIDE 20-25 MG PO TABS: 1.0000 | ORAL_TABLET | Freq: Every day | ORAL | 0 refills | Status: DC

## 2017-04-11 NOTE — Progress Notes (Signed)
Laura Clay is a 46 y.o. female is here to follow up on Hypertension.  I acted as a Neurosurgeonscribe for [employer], PA-C [name], LPN  History of Present Illness:   Chief Complaint  Patient presents with  . Follow-up  . Hypertension    Hypertension  This is a recurrent problem. Episode onset: Pt here for one month follow up, blood pressure has been averaging 135 /100. Pt thinks medication needs to be changed.  The problem is unchanged. The problem is uncontrolled. Pertinent negatives include no blurred vision, chest pain, headaches, malaise/fatigue, palpitations, peripheral edema or shortness of breath. Risk factors for coronary artery disease include family history. Past treatments include calcium channel blockers. The current treatment provides no improvement. There are no compliance problems.    She does report that she snores, but has not been told that she has issue with "pauses in breathing" while she snores. She is not interested in a sleep study at this time.  Denies any excess stress or caffeine use.   Health Maintenance Due  Topic Date Due  . HIV Screening  01/24/1988    Past Medical History:  Diagnosis Date  . Anxiety   . Asthma   . Depression   . GERD (gastroesophageal reflux disease)   . Hypertension   . Kidney stones      Social History   Social History  . Marital status: Widowed    Spouse name: N/A  . Number of children: N/A  . Years of education: N/A   Occupational History  . Not on file.   Social History Main Topics  . Smoking status: Former Smoker    Years: 12.00  . Smokeless tobacco: Never Used  . Alcohol use Yes     Comment: Socially  . Drug use: No  . Sexual activity: Yes    Partners: Male    Birth control/ protection: Surgical   Other Topics Concern  . 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 ABDOMINAL HYSTERECTOMY  2000   Abnormal PAPs  . BUNIONECTOMY Left   . OVARIAN CYST REMOVAL      Family History  Problem Relation Age of Onset  . Breast cancer Maternal Grandmother   . Colon cancer Maternal Aunt   . Colon polyps Cousin   . Colon polyps Maternal Aunt   . Diabetes Father   . Heart disease Father   . Kidney disease Father   . Kidney cancer Maternal Uncle   . Hypertension Mother     PMHx, SurgHx, SocialHx, FamHx, Medications, and Allergies were reviewed in the Visit Navigator and updated as appropriate.   Patient Active Problem List   Diagnosis Date Noted  . Sinus tachycardia 07/09/2016  . Moderate persistent asthma with acute exacerbation   . Status asthmaticus 10/01/2015  . Leukocytosis 10/01/2015  . Hyperglycemia 10/01/2015  . Depression with anxiety 06/22/2012  . HTN (hypertension), accelerated  05/08/2012    Social History  Substance Use Topics  . Smoking status: Former Smoker    Years: 12.00  . Smokeless tobacco: Never Used  . Alcohol use Yes     Comment: Socially    Current Medications and Allergies:    Current Outpatient Prescriptions:  .  albuterol (PROVENTIL HFA;VENTOLIN HFA) 108 (90 Base) MCG/ACT  inhaler, Inhale 2 puffs into the lungs every 6 (six) hours as needed for wheezing or shortness of breath., Disp: , Rfl:  .  albuterol (PROVENTIL) (2.5 MG/3ML) 0.083% nebulizer solution, USE ONE VIAL IN NEBULIZER EVERY 4 TO 6 HOURS AS NEEDED FOR COUGH OR WHEEZING, Disp: 50 vial, Rfl: 1 .  amLODipine (NORVASC) 10 MG tablet, Take 1 tablet (10 mg total) by mouth daily., Disp: 90 tablet, Rfl: 0 .  diphenhydrAMINE (BENADRYL) 25 MG tablet, Take 50 mg by mouth at bedtime., Disp: , Rfl:  .  ibuprofen (ADVIL,MOTRIN) 200 MG tablet, Take 600 mg by mouth every 6 (six) hours as needed for headache. , Disp: , Rfl:  .  lisinopril-hydrochlorothiazide (PRINZIDE,ZESTORETIC) 20-25 MG tablet, Take 1 tablet by mouth daily., Disp: 30  tablet, Rfl: 0 .  Multiple Vitamin (MULTIVITAMIN) tablet, Take 1 tablet by mouth daily., Disp: , Rfl:  .  omeprazole (PRILOSEC) 20 MG capsule, Take 20 mg by mouth daily. , Disp: , Rfl:  .  SYMBICORT 160-4.5 MCG/ACT inhaler, Inhale 2 puffs into the lungs 2 (two) times daily., Disp: 1 Inhaler, Rfl: 3 .  triamcinolone (NASACORT ALLERGY 24HR) 55 MCG/ACT AERO nasal inhaler, Place 1 spray into the nose daily., Disp: , Rfl:  .  valACYclovir (VALTREX) 1000 MG tablet, Take one tablet daily., Disp: 30 tablet, Rfl: 11   Allergies  Allergen Reactions  . Dilaudid [Hydromorphone Hcl] Shortness Of Breath, Swelling and Other (See Comments)    Redness in hands and thorat.  . Morphine     REACTION: per pt gives her hives    Review of Systems   Review of Systems  Constitutional: Negative for malaise/fatigue.  Eyes: Negative for blurred vision.  Respiratory: Negative for shortness of breath.   Cardiovascular: Negative for chest pain and palpitations.  Neurological: Negative for headaches.    Vitals:   Vitals:   04/11/17 1409  BP: (!) 146/100  Pulse: (!) 118  Temp: 99 F (37.2 C)  TempSrc: Oral  SpO2: 96%  Weight: 159 lb 8 oz (72.3 kg)  Height: 5\' 4"  (1.626 m)     Body mass index is 27.38 kg/m.   Physical Exam:    Physical Exam  Constitutional: She appears well-developed. She is cooperative.  Non-toxic appearance. She does not have a sickly appearance. She does not appear ill. No distress.  Cardiovascular: Normal rate, regular rhythm, S1 normal, S2 normal, normal heart sounds and normal pulses.   No LE edema  Pulmonary/Chest: Effort normal and breath sounds normal.  Neurological: She is alert. GCS eye subscore is 4. GCS verbal subscore is 5. GCS motor subscore is 6.  Skin: Skin is warm, dry and intact.  Psychiatric: She has a normal mood and affect. Her speech is normal and behavior is normal.  Nursing note and vitals reviewed.    Assessment and Plan:    Laura Clay was seen today for  follow-up and hypertension.  Diagnoses and all orders for this visit:  Essential hypertension Blood pressure remains uncontrolled despite maximum dosage of Prinzide and Norvasc. She was on a beta blocker at one point but was taken off of it because it was making her asthma worse, so I am reluctant to start this medication and would rather her be evaluated by cardiology -- which she is in agreement with. She will consider the sleep study however she wants to wait until after she sees the cardiologist to determine whether or not she wants to do this. Will re-check CBC and CMP today. TSH  was completed in April and was normal.  -     CBC with Differential/Platelet -     Comprehensive metabolic panel -     Ambulatory referral to Cardiology  Encounter for screening for HIV -     HIV antibody  Other orders -     lisinopril-hydrochlorothiazide (PRINZIDE,ZESTORETIC) 20-25 MG tablet; Take 1 tablet by mouth daily. -     amLODipine (NORVASC) 10 MG tablet; Take 1 tablet (10 mg total) by mouth daily.    . Reviewed expectations re: course of current medical issues. . Discussed self-management of symptoms. . Outlined signs and symptoms indicating need for more acute intervention. . Patient verbalized understanding and all questions were answered. . See orders for this visit as documented in the electronic medical record. . Patient received an After Visit Summary.  CMA or LPN served as scribe during this visit. History, Physical, and Plan performed by medical provider. Documentation and orders reviewed and attested to.  Jarold Motto, PA-C [place] [place] 04/11/2017  Follow-up: No Follow-up on file.

## 2017-04-11 NOTE — Patient Instructions (Signed)
You will be contacted about your lab results and referral to cardiology.  Hypertension Hypertension, commonly called high blood pressure, is when the force of blood pumping through the arteries is too strong. The arteries are the blood vessels that carry blood from the heart throughout the body. Hypertension forces the heart to work harder to pump blood and may cause arteries to become narrow or stiff. Having untreated or uncontrolled hypertension can cause heart attacks, strokes, kidney disease, and other problems. A blood pressure reading consists of a higher number over a lower number. Ideally, your blood pressure should be below 120/80. The first ("top") number is called the systolic pressure. It is a measure of the pressure in your arteries as your heart beats. The second ("bottom") number is called the diastolic pressure. It is a measure of the pressure in your arteries as the heart relaxes. What are the causes? The cause of this condition is not known. What increases the risk? Some risk factors for high blood pressure are under your control. Others are not. Factors you can change  Smoking.  Having type 2 diabetes mellitus, high cholesterol, or both.  Not getting enough exercise or physical activity.  Being overweight.  Having too much fat, sugar, calories, or salt (sodium) in your diet.  Drinking too much alcohol. Factors that are difficult or impossible to change  Having chronic kidney disease.  Having a family history of high blood pressure.  Age. Risk increases with age.  Race. You may be at higher risk if you are African-American.  Gender. Men are at higher risk than women before age 44. After age 44, women are at higher risk than men.  Having obstructive sleep apnea.  Stress. What are the signs or symptoms? Extremely high blood pressure (hypertensive crisis) may cause:  Headache.  Anxiety.  Shortness of breath.  Nosebleed.  Nausea and vomiting.  Severe  chest pain.  Jerky movements you cannot control (seizures).  How is this diagnosed? This condition is diagnosed by measuring your blood pressure while you are seated, with your arm resting on a surface. The cuff of the blood pressure monitor will be placed directly against the skin of your upper arm at the level of your heart. It should be measured at least twice using the same arm. Certain conditions can cause a difference in blood pressure between your right and left arms. Certain factors can cause blood pressure readings to be lower or higher than normal (elevated) for a short period of time:  When your blood pressure is higher when you are in a health care provider's office than when you are at home, this is called white coat hypertension. Most people with this condition do not need medicines.  When your blood pressure is higher at home than when you are in a health care provider's office, this is called masked hypertension. Most people with this condition may need medicines to control blood pressure.  If you have a high blood pressure reading during one visit or you have normal blood pressure with other risk factors:  You may be asked to return on a different day to have your blood pressure checked again.  You may be asked to monitor your blood pressure at home for 1 week or longer.  If you are diagnosed with hypertension, you may have other blood or imaging tests to help your health care provider understand your overall risk for other conditions. How is this treated? This condition is treated by making healthy lifestyle changes,  such as eating healthy foods, exercising more, and reducing your alcohol intake. Your health care provider may prescribe medicine if lifestyle changes are not enough to get your blood pressure under control, and if:  Your systolic blood pressure is above 130.  Your diastolic blood pressure is above 80.  Your personal target blood pressure may vary depending on  your medical conditions, your age, and other factors. Follow these instructions at home: Eating and drinking  Eat a diet that is high in fiber and potassium, and low in sodium, added sugar, and fat. An example eating plan is called the DASH (Dietary Approaches to Stop Hypertension) diet. To eat this way: ? Eat plenty of fresh fruits and vegetables. Try to fill half of your plate at each meal with fruits and vegetables. ? Eat whole grains, such as whole wheat pasta, brown rice, or whole grain bread. Fill about one quarter of your plate with whole grains. ? Eat or drink low-fat dairy products, such as skim milk or low-fat yogurt. ? Avoid fatty cuts of meat, processed or cured meats, and poultry with skin. Fill about one quarter of your plate with lean proteins, such as fish, chicken without skin, beans, eggs, and tofu. ? Avoid premade and processed foods. These tend to be higher in sodium, added sugar, and fat.  Reduce your daily sodium intake. Most people with hypertension should eat less than 1,500 mg of sodium a day.  Limit alcohol intake to no more than 1 drink a day for nonpregnant women and 2 drinks a day for men. One drink equals 12 oz of beer, 5 oz of wine, or 1 oz of hard liquor. Lifestyle  Work with your health care provider to maintain a healthy body weight or to lose weight. Ask what an ideal weight is for you.  Get at least 30 minutes of exercise that causes your heart to beat faster (aerobic exercise) most days of the week. Activities may include walking, swimming, or biking.  Include exercise to strengthen your muscles (resistance exercise), such as pilates or lifting weights, as part of your weekly exercise routine. Try to do these types of exercises for 30 minutes at least 3 days a week.  Do not use any products that contain nicotine or tobacco, such as cigarettes and e-cigarettes. If you need help quitting, ask your health care provider.  Monitor your blood pressure at home  as told by your health care provider.  Keep all follow-up visits as told by your health care provider. This is important. Medicines  Take over-the-counter and prescription medicines only as told by your health care provider. Follow directions carefully. Blood pressure medicines must be taken as prescribed.  Do not skip doses of blood pressure medicine. Doing this puts you at risk for problems and can make the medicine less effective.  Ask your health care provider about side effects or reactions to medicines that you should watch for. Contact a health care provider if:  You think you are having a reaction to a medicine you are taking.  You have headaches that keep coming back (recurring).  You feel dizzy.  You have swelling in your ankles.  You have trouble with your vision. Get help right away if:  You develop a severe headache or confusion.  You have unusual weakness or numbness.  You feel faint.  You have severe pain in your chest or abdomen.  You vomit repeatedly.  You have trouble breathing. Summary  Hypertension is when the force of  blood pumping through your arteries is too strong. If this condition is not controlled, it may put you at risk for serious complications.  Your personal target blood pressure may vary depending on your medical conditions, your age, and other factors. For most people, a normal blood pressure is less than 120/80.  Hypertension is treated with lifestyle changes, medicines, or a combination of both. Lifestyle changes include weight loss, eating a healthy, low-sodium diet, exercising more, and limiting alcohol. This information is not intended to replace advice given to you by your health care provider. Make sure you discuss any questions you have with your health care provider. Document Released: 07/25/2005 Document Revised: 06/22/2016 Document Reviewed: 06/22/2016 Elsevier Interactive Patient Education  [employer].

## 2017-04-12 LAB — HIV ANTIBODY (ROUTINE TESTING W REFLEX): HIV: NONREACTIVE

## 2017-05-08 ENCOUNTER — Encounter: Payer: Self-pay | Admitting: Cardiology

## 2017-05-08 ENCOUNTER — Ambulatory Visit (INDEPENDENT_AMBULATORY_CARE_PROVIDER_SITE_OTHER): Payer: 59 | Admitting: Cardiology

## 2017-05-08 VITALS — BP 138/99 | HR 119 | Ht 64.0 in | Wt 161.0 lb

## 2017-05-08 DIAGNOSIS — R000 Tachycardia, unspecified: Secondary | ICD-10-CM | POA: Diagnosis not present

## 2017-05-08 DIAGNOSIS — I10 Essential (primary) hypertension: Secondary | ICD-10-CM | POA: Diagnosis not present

## 2017-05-08 NOTE — Patient Instructions (Signed)
SCHEDULE AT [address] Your physician has requested that you have a renal artery duplex. During this test, an ultrasound is used to evaluate blood flow to the kidneys. Allow one hour for this exam. Do not eat after midnight the day before and avoid carbonated beverages. Take your medications as you usually do.    Your physician recommends that you schedule a follow-up appointment in 1 MONTH WITH DR HARDING.

## 2017-05-08 NOTE — Progress Notes (Signed)
PCP: Jarold Motto, PA  Clinic Note: Chief Complaint  Patient presents with  . New Patient (Initial Visit)    HTN    HPI: Laura Clay is a 46 y.o. female who is being seen today for the evaluation of HTN at the request of Jarold Motto, [place].  Genine Beckett was last seen on September 4 by Jarold Motto, PA -- this for follow-up blood pressure evaluation.  Blood pressures averaged 135/100 mmHg. She denied any significant symptoms from this however.  Recent Hospitalizations: None  Studies Personally Reviewed - (if available, images/films reviewed: From Epic Chart or Care Everywhere)  None  Interval History: Presents here today for evaluation of her hypertension. But she also notes that she is not having nearly the amount of headache and dizziness episodes that she was having when her blood pressure for quite elevated back in April. What she notes occasionally now is hot flush sensations, even that is improved as the medication doses have been increased.  Really, besides the new diagnosis of hypertension, her major medical condition has been quite active asthma. Back in February 2017 she contracted influenza that then triggered an asthma exacerbation resulting in a weeklong spell the ICU on BiPAP. Now she is on standing medications as well as inhalers. She does not really note that the inhalers make her heart rate go faster than usual  She has lost quite a bit overweight over the last several years, and has been quite active, denies any real symptoms of chest tightness or pressure with rest or exertion. She does notice exertional dyspnea that has been treated asthma. No significant edema. No PND or orthopnea.  She does note that her heart rate goes up very quickly with exercise or with feeling anxious as noted in her vital signs today. When her heart rate gets a very fast she will notice a little lightheadedness and dizziness, but no syncope or near syncope. No TIA or  amaurosis fugax.   No melena, hematochezia, hematuria, or epstaxis. No claudication.  ROS: A comprehensive was performed. Review of Systems  Constitutional: Positive for malaise/fatigue. Negative for diaphoresis.  HENT: Negative for congestion and nosebleeds.   Respiratory: Positive for shortness of breath and wheezing (uses PRN Albuterol Inhaler).   Cardiovascular: Negative for leg swelling.  Gastrointestinal: Negative for abdominal pain, blood in stool, melena and nausea.  Genitourinary: Negative for hematuria.  Musculoskeletal: Negative.   Neurological: Positive for dizziness and headaches (Notably improved as blood pressures improved). Negative for weakness.  Endo/Heme/Allergies: Bruises/bleeds easily.  Psychiatric/Behavioral: The patient is nervous/anxious.   All other systems reviewed and are negative.   I have reviewed and (if needed) personally updated the patient's problem list, medications, allergies, past medical and surgical history, social and family history.   Past Medical History:  Diagnosis Date  . Anxiety   . Asthma   . Depression   . GERD (gastroesophageal reflux disease)   . Hypertension   . Kidney stones     Past Surgical History:  Procedure Laterality Date  . ABDOMINAL HYSTERECTOMY  2000   Abnormal PAPs  . BUNIONECTOMY Left   . OVARIAN CYST REMOVAL      Current Meds  Medication Sig  . albuterol (PROVENTIL HFA;VENTOLIN HFA) 108 (90 Base) MCG/ACT inhaler Inhale 2 puffs into the lungs every 6 (six) hours as needed for wheezing or shortness of breath.  Marland Kitchen albuterol (PROVENTIL) (2.5 MG/3ML) 0.083% nebulizer solution USE ONE VIAL IN NEBULIZER EVERY 4 TO 6 HOURS AS NEEDED FOR  COUGH OR WHEEZING  . amLODipine (NORVASC) 10 MG tablet Take 1 tablet (10 mg total) by mouth daily.  . diphenhydrAMINE (BENADRYL) 25 MG tablet Take 50 mg by mouth at bedtime.  Marland Kitchen ibuprofen (ADVIL,MOTRIN) 200 MG tablet Take 600 mg by mouth every 6 (six) hours as needed for headache.   .  lisinopril-hydrochlorothiazide (PRINZIDE,ZESTORETIC) 20-25 MG tablet Take 1 tablet by mouth daily.  . Multiple Vitamin (MULTIVITAMIN) tablet Take 1 tablet by mouth daily.  Marland Kitchen omeprazole (PRILOSEC) 20 MG capsule Take 20 mg by mouth daily.   . SYMBICORT 160-4.5 MCG/ACT inhaler Inhale 2 puffs into the lungs 2 (two) times daily.  Marland Kitchen triamcinolone (NASACORT ALLERGY 24HR) 55 MCG/ACT AERO nasal inhaler Place 1 spray into the nose daily.  . valACYclovir (VALTREX) 1000 MG tablet Take one tablet daily.    Allergies  Allergen Reactions  . Dilaudid [Hydromorphone Hcl] Shortness Of Breath, Swelling and Other (See Comments)    Redness in hands and thorat.  . Morphine Hives    REACTION: per pt gives her hives    Social History   Social History  . Marital status: Widowed    Spouse name: N/A  . Number of children: N/A  . Years of education: N/A   Social History Main Topics  . Smoking status: Former Smoker    Years: 12.00  . Smokeless tobacco: Never Used  . Alcohol use Yes     Comment: Socially  . Drug use: No  . Sexual activity: Yes    Partners: Male    Birth control/ protection: Surgical   Other Topics Concern  . None   Social History Narrative   Widowed.    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  Wt Readings from Last 3 Encounters:  05/08/17 161 lb (73 kg)  04/11/17 159 lb 8 oz (72.3 kg)    12/09/16 161 lb (73 kg)    PHYSICAL EXAM BP (!) 138/99 (BP Location: Right Arm)   Pulse (!) 119   Ht  (1.626 m)   Wt 161 lb (73 kg)   BMI 27.64 kg/m  Physical Exam  Constitutional: She is oriented to person, place, and time. She appears well-developed and well-nourished. No distress.  Well-groomed.  HENT:  Head: Normocephalic and atraumatic.  Mouth/Throat: Oropharynx is clear and moist. No oropharyngeal exudate.  Eyes: Conjunctivae and EOM are normal. No scleral icterus.  Neck: Normal range of motion. Neck supple. No hepatojugular reflux and no JVD present. Carotid bruit is not present. No thyromegaly present.  Cardiovascular: Regular rhythm, normal heart sounds and intact distal pulses.  Tachycardia present.  PMI is not displaced.  Exam reveals no gallop, no friction rub and no midsystolic click.   No murmur heard. Pulmonary/Chest: Effort normal and breath sounds normal. No respiratory distress. She has no wheezes. She has no rales.  Abdominal: Soft. Bowel sounds are normal. She exhibits no distension. There is no tenderness. There is no rebound.  Musculoskeletal: Normal range of motion. She exhibits no edema.  Lymphadenopathy:    She has no cervical adenopathy.  Neurological: She is alert and oriented to person, place, and time. No cranial nerve deficit.  Skin: Skin is warm and dry. No rash noted. No erythema. No pallor.  Psychiatric: She has a normal mood and affect. Her behavior is normal. Judgment and thought content normal.  Nursing note and vitals reviewed.    Adult ECG Report  Rate: 119 ;  Rhythm: sinus tachycardia and Otherwise normal axis, intervals and durations;   Narrative Interpretation: Relatively normal EKG   Other studies Reviewed: Additional studies/ records that were reviewed today include:  Recent Labs:    Lab Results  Component Value Date   CREATININE 0.97 04/11/2017   BUN 11 04/11/2017   NA 135 04/11/2017   K 3.8 04/11/2017   CL 97 04/11/2017    CO2 26 04/11/2017    Lab Results  Component Value Date   LDLDIRECT 134 (H) 10/16/2013    ASSESSMENT / PLAN: Problem List Items Addressed This Visit    HTN (hypertension), accelerated  - Primary (Chronic)    Relatively no change in blood pressures from right to left arm on evaluation today. This will argue against aortic coarctation. Diastolic pressures are still elevated and heart rate is increased.  Plan: Continue current dose of lisinopril-HCTZ, however with tachycardia we may want to actually consider weaning off the HCTZ component). For now continue amlodipine  With increased resting heart rate, will consider starting bisoprolol 5 mg daily (beta 1 selective)  Check renal artery Doppler to exclude renal artery stenosis  If pressures continue to be elevated, will need to assess for any adverse cardiac effects with Transthoracic Echo.       Relevant Orders   EKG 12-Lead (Completed)   VAS US RENAL ARTERY DUPLEX   Sinus tachycardia by electrocardiogram    We will need to see  on reevaluation what her heart rate actually is. Next step would be to add a beta blocker such as bisoprolol (in the setting of her asthma) for beta 1 selective 3.      Relevant Orders   EKG 12-Lead (Completed)   VAS US RENAL ARTERY DUPLEX      Current medicines are reviewed at length with the patient today. (+/- concerns) would like to avoid more meds The following changes have been made: add Bisoprolol = would hope that we could wean off amlodipine.  Patient Instructions  SCHEDULE AT [address] Your physician has requested that you have a renal artery duplex. During this test, an ultrasound is used to evaluate blood flow to the kidneys. Allow one hour for this exam. Do not eat after midnight the day before and avoid carbonated beverages. Take your medications as you usually do.    Your physician recommends that you schedule a follow-up appointment in 1 MONTH WITH DR  Brylen Wagar.   Studies Ordered:   Orders Placed This Encounter  Procedures  . EKG 12-Lead      Bryan Lemma, M.D., M.S. Interventional Cardiologist   Pager # [phone] Phone # [phone] [address] [place], [place] [address]

## 2017-05-10 ENCOUNTER — Encounter: Payer: Self-pay | Admitting: Cardiology

## 2017-05-10 NOTE — Assessment & Plan Note (Signed)
We will need to see  on reevaluation what her heart rate actually is. Next step would be to add a beta blocker such as bisoprolol (in the setting of her asthma) for beta 1 selective 3.

## 2017-05-10 NOTE — Assessment & Plan Note (Addendum)
Relatively no change in blood pressures from right to left arm on evaluation today. This will argue against aortic coarctation. Diastolic pressures are still elevated and heart rate is increased.  Plan: Continue current dose of lisinopril-HCTZ, however with tachycardia we may want to actually consider weaning off the HCTZ component). For now continue amlodipine  With increased resting heart rate, will consider starting bisoprolol 5 mg daily (beta 1 selective)  Check renal artery Doppler to exclude renal artery stenosis  If pressures continue to be elevated, will need to assess for any adverse cardiac effects with Transthoracic Echo.

## 2017-05-12 ENCOUNTER — Other Ambulatory Visit: Payer: Self-pay | Admitting: Cardiology

## 2017-05-12 DIAGNOSIS — I10 Essential (primary) hypertension: Secondary | ICD-10-CM

## 2017-05-29 ENCOUNTER — Ambulatory Visit (HOSPITAL_COMMUNITY)
Admission: RE | Admit: 2017-05-29 | Discharge: 2017-05-29 | Disposition: A | Discharge status: Home / Self Care | Payer: 59 | Source: Ambulatory Visit | Attending: Cardiology | Admitting: Cardiology

## 2017-05-29 DIAGNOSIS — I10 Essential (primary) hypertension: Secondary | ICD-10-CM | POA: Insufficient documentation

## 2017-06-01 ENCOUNTER — Other Ambulatory Visit: Payer: Self-pay | Admitting: Physician Assistant

## 2017-06-14 ENCOUNTER — Encounter: Payer: Self-pay | Admitting: Cardiology

## 2017-06-14 ENCOUNTER — Ambulatory Visit (INDEPENDENT_AMBULATORY_CARE_PROVIDER_SITE_OTHER): Payer: 59 | Admitting: Cardiology

## 2017-06-14 VITALS — BP 120/88 | HR 128 | Ht 65.5 in | Wt 161.8 lb

## 2017-06-14 DIAGNOSIS — I10 Essential (primary) hypertension: Secondary | ICD-10-CM

## 2017-06-14 DIAGNOSIS — R000 Tachycardia, unspecified: Secondary | ICD-10-CM | POA: Diagnosis not present

## 2017-06-14 MED ORDER — DILTIAZEM HCL ER COATED BEADS 240 MG PO CP24: 240.0000 mg | ORAL_CAPSULE | Freq: Every day | ORAL | 6 refills | Status: DC

## 2017-06-14 NOTE — Patient Instructions (Addendum)
MEDICATION CHANGES -- STOP TAKING AMLODIPINE 10 MG  AFTER ONE WEEK OF ALTERNATING WITH DILTIAZEM 240 MG DAY  - START DILTIAZEM 240 MG  ON CAPSULES DALY      Your physician recommends that you schedule a follow-up appointment in 1 MONTH WITH [facility] - BLOOD PRESSURE.      Your physician wants you to follow-up in 4 MONTHS WITH DR HARDING.   If you need a refill on your cardiac medications before your next appointment, please call your pharmacy.

## 2017-06-14 NOTE — Assessment & Plan Note (Signed)
Notably improved blood pressure on current medications.  Hopefully by switching from amlodipine to diltiazem we will would not lose too much ground.  Her blood pressure is well controlled today.

## 2017-06-14 NOTE — Assessment & Plan Note (Addendum)
She still has sinus tachycardia today although her blood pressure is much better controlled.  With her asthma being a major concern, I am a bit leery of using a beta-blocker.  I think what I will try to do is convert from amlodipine to diltiazem.  We will start with diltiazem 240mg .  The first week and will alternate one day amlodipine and 1 day diltiazem and then will switch to directed acute diltiazem. We Will Have Her Follow-Up with Our Pharmacy Team in the [facility] ([facility]).  If diltiazem does not work, would consider verapamil or potentially bisoprolol.

## 2017-06-14 NOTE — Progress Notes (Signed)
PCP: Laura MottoWorley, Samantha, PA  Clinic Note: Chief Complaint  Patient presents with  . Follow-up  . Hypertension  . Tachycardia    HPI: Laura Clay is a 46 y.o. female who is being seen today for the follow-up evaluation of HTN at the request of Laura Clay, [place].  Laura Clay was last seen on September 4 by Laura MottoSamantha Worley, PA -- this for follow-up blood pressure evaluation.  I then saw her in early October.  Recent Hospitalizations: None  Studies Personally Reviewed - (if available, images/films reviewed: From Epic Chart or Care Everywhere)  Renal artery Dopplers October 2018: Normal  Interval History: Presents here today for follow-up evaluation of her hypertension.  Laura Clay returns today actually doing fairly well.  She is not noticing really any true symptoms to speak of.  She may get a little short of breath if she exerts herself, but not really.  She says she occasionally feels palpitations but nothing prolonged.  She does not notice that her heart rate is fast today.  She denies any headaches or blurred vision which would indicate that her blood pressure is much better than it had been.  Otherwise requires them when she is quite stable.  Review of symptoms as follows: No chest pain or pressure with rest or exertion.  No PND, orthopnea or edema.  Although she does have some occasional palpitations and fast heartbeats, she denies any syncope or near syncope.  No TIA or amaurosis fugax symptoms.  She does feel a little bit tired more so than usual. No claudication  ROS: A comprehensive was performed. Review of Systems  Constitutional: Positive for malaise/fatigue (But overall improving). Negative for diaphoresis.  HENT: Negative for congestion and nosebleeds.   Respiratory: Positive for shortness of breath (Related to asthma) and wheezing (uses PRN Albuterol Inhaler).   Cardiovascular: Negative for leg swelling.  Gastrointestinal: Negative for abdominal pain, blood in  stool, melena and nausea.  Genitourinary: Negative for hematuria.  Musculoskeletal: Negative.  Negative for joint pain.  Neurological: Negative for tingling, focal weakness and weakness. Headaches: Notably improved as blood pressures improved.       Headache and dizziness only occurs when her blood pressure is not controlled  Endo/Heme/Allergies: Bruises/bleeds easily.  Psychiatric/Behavioral: The patient is nervous/anxious.   All other systems reviewed and are negative.   I have reviewed and (if needed) personally updated the patient's problem list, medications, allergies, past medical and surgical history, social and family history.   Past Medical History:  Diagnosis Date  . Anxiety   . Asthma   . Depression   . GERD (gastroesophageal reflux disease)   . Hypertension   . Kidney stones     Past Surgical History:  Procedure Laterality Date  . ABDOMINAL HYSTERECTOMY  2000   Abnormal PAPs  . BUNIONECTOMY Left   . OVARIAN CYST REMOVAL      Current Meds  Medication Sig  . albuterol (PROVENTIL HFA;VENTOLIN HFA) 108 (90 Base) MCG/ACT inhaler Inhale 2 puffs into the lungs every 6 (six) hours as needed for wheezing or shortness of breath.  Marland Kitchen. albuterol (PROVENTIL) (2.5 MG/3ML) 0.083% nebulizer solution USE ONE VIAL IN NEBULIZER EVERY 4 TO 6 HOURS AS NEEDED FOR COUGH OR WHEEZING  . diphenhydrAMINE (BENADRYL) 25 MG tablet Take 50 mg by mouth at bedtime.  Marland Kitchen. ibuprofen (ADVIL,MOTRIN) 200 MG tablet Take 600 mg by mouth every 6 (six) hours as needed for headache.   . lisinopril-hydrochlorothiazide (PRINZIDE,ZESTORETIC) 20-25 MG tablet TAKE 1 TABLET BY  MOUTH ONCE DAILY  . Multiple Vitamin (MULTIVITAMIN) tablet Take 1 tablet by mouth daily.  Marland Kitchen omeprazole (PRILOSEC) 20 MG capsule Take 20 mg by mouth daily.   . SYMBICORT 160-4.5 MCG/ACT inhaler Inhale 2 puffs into the lungs 2 (two) times daily.  Marland Kitchen triamcinolone (NASACORT ALLERGY 24HR) 55 MCG/ACT AERO nasal inhaler Place 1 spray into the nose  daily.  . valACYclovir (VALTREX) 1000 MG tablet Take one tablet daily.  . [DISCONTINUED] amLODipine (NORVASC) 10 MG tablet Take 1 tablet (10 mg total) by mouth daily.    Allergies  Allergen Reactions  . Dilaudid [Hydromorphone Hcl] Shortness Of Breath, Swelling and Other (See Comments)    Redness in hands and thorat.  . Morphine Hives    REACTION: per pt gives her hives    Social History   Socioeconomic History  . Marital status: Widowed    Spouse name: None  . Number of children: None  . Years of education: None  . Highest education level: None  Social Needs  . Financial resource strain: None  . Food insecurity - worry: None  . Food insecurity - inability: None  . Transportation needs - medical: None  . Transportation needs - non-medical: None  Occupational History  . None  Tobacco Use  . Smoking status: Former Smoker    Years: 12.00  . Smokeless tobacco: Never Used  Substance and Sexual Activity  . Alcohol use: Yes    Comment: Socially  . Drug use: No  . Sexual activity: Yes    Partners: Male    Birth control/protection: Surgical  Other Topics Concern  . None  Social History Narrative   Widowed.    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  Wt Readings from Last 3 Encounters:  06/14/17 161 lb 12.8 oz (73.4 kg)  05/08/17 161 lb (73 kg)  04/11/17 159 lb 8 oz (72.3 kg)     PHYSICAL EXAM BP 120/88   Pulse (!) 128   Ht 5' 5.5" (1.664 m)   Wt 161 lb 12.8 oz (73.4 kg)   SpO2 100%   BMI 26.52 kg/m  Physical Exam  Constitutional: She is oriented to person, place, and time. She appears well-developed and well-nourished. No distress.  Well-groomed.  HENT:  Head: Normocephalic and atraumatic.  Mouth/Throat: Oropharynx is clear and moist. No oropharyngeal exudate.  Neck: Normal range of motion. Neck supple. No hepatojugular reflux and no JVD present. Carotid bruit is not present. No thyromegaly present.  Cardiovascular: Regular rhythm, normal heart sounds and intact distal pulses. Tachycardia present. PMI is not displaced. Exam reveals no gallop, no friction rub and no midsystolic click.  No murmur heard. Pulmonary/Chest: Effort normal and breath sounds normal. No respiratory distress. She has no wheezes. She has no rales.  Abdominal: Soft. Bowel sounds are normal. She exhibits no distension. There is no tenderness.  There is no rebound.  Musculoskeletal: Normal range of motion. She exhibits no edema.  Neurological: She is alert and oriented to person, place, and time.  Skin: Skin is warm and dry.  Psychiatric: She has a normal mood and affect. Her behavior is normal. Judgment and thought content normal.  Nursing note and vitals reviewed.    Adult ECG Report Not checked  Other studies Reviewed: Additional studies/ records that were reviewed today include:  Recent Labs:    Lab Results  Component Value Date   CREATININE 0.97 04/11/2017   BUN 11 04/11/2017   NA 135 04/11/2017   K 3.8 04/11/2017   CL 97 04/11/2017   CO2 26 04/11/2017    Lab Results  Component Value Date   LDLDIRECT 134 (H) 10/16/2013    ASSESSMENT / PLAN: Problem List Items Addressed This Visit    HTN (hypertension), accelerated  (Chronic)    Notably improved blood pressure on current medications.  Hopefully by switching from amlodipine to diltiazem we will would not lose too  much ground.  Her blood pressure is well controlled today.      Relevant Medications   diltiazem (CARDIZEM CD) 240 MG 24 hr capsule   Sinus tachycardia by electrocardiogram - Primary (Chronic)    She still has sinus tachycardia today although her blood pressure is much better controlled.  With her asthma being a major concern, I am a bit leery of using a beta-blocker.  I think what I will try to do is convert from amlodipine to diltiazem.  We will start with diltiazem 240mg .  The first week and will alternate one day amlodipine and 1 day diltiazem and then will switch to directed acute diltiazem. We Will Have Her Follow-Up with Our Pharmacy Team in the Cardiovascular [facility] ([facility]).  If diltiazem does not work, would consider verapamil or potentially bisoprolol.         Current medicines are reviewed at length with the patient today. (+/- concerns)  The following changes have been made:   Patient Instructions  MEDICATION CHANGES -- STOP TAKING AMLODIPINE 10 MG  AFTER ONE WEEK OF ALTERNATING WITH DILTIAZEM 240 MG DAY  - START DILTIAZEM 240 MG  ON CAPSULES DALY      Your physician recommends that you schedule a follow-up appointment in 1 MONTH WITH [facility] - BLOOD PRESSURE.      Your physician wants you to follow-up in 4 MONTHS WITH DR HARDING.   If you need a refill on your cardiac medications before your next appointment, please call your pharmacy.    Studies Ordered:   No orders of the defined types were placed in this encounter.     Bryan Lemmaavid Harding, M.D., M.S. Interventional Cardiologist   Pager # [phone] Phone # [phone] [address] [place], [place] [address]

## 2017-07-08 ENCOUNTER — Other Ambulatory Visit: Payer: Self-pay | Admitting: Physician Assistant

## 2017-07-11 ENCOUNTER — Telehealth: Payer: Self-pay | Admitting: Physician Assistant

## 2017-07-11 ENCOUNTER — Other Ambulatory Visit: Payer: Self-pay

## 2017-07-11 MED ORDER — VALACYCLOVIR HCL 1 G PO TABS: ORAL_TABLET | ORAL | 11 refills | Status: DC

## 2017-07-11 MED ORDER — SYMBICORT 160-4.5 MCG/ACT IN AERO: 2.0000 | INHALATION_SPRAY | Freq: Two times a day (BID) | RESPIRATORY_TRACT | 3 refills | Status: DC

## 2017-07-11 NOTE — Telephone Encounter (Unsigned)
Copied from CRM [phone]. Topic: General - Other >> Jul 11, 2017 12:49 PM Raquel SarnaHayes, Teresa G wrote:  Pt is needing the following refilled ASAP Symbicort  Valtrex   [facility] [address]

## 2017-07-13 NOTE — Telephone Encounter (Signed)
Chart shows both were refilled 07/11/17

## 2017-07-18 ENCOUNTER — Ambulatory Visit: Payer: 59

## 2017-07-25 ENCOUNTER — Ambulatory Visit: Payer: 59

## 2017-07-27 ENCOUNTER — Ambulatory Visit: Payer: 59

## 2017-07-27 NOTE — Progress Notes (Deleted)
Patient ID: Laura Clay                 DOB: [birth year]                      MRN: [id number]     HPI: Laura Clay is a 46 y.o. female referred by Dr. Herbie BaltimoreHarding to HTN clinic. PMH includes uncontrolled hypertension, anxiety, and asthma.   Current HTN meds:  Previously tried:  BP goal:   Family History:   Social History:   Diet:   Exercise:   Home BP readings:   Wt Readings from Last 3 Encounters:  06/14/17 161 lb 12.8 oz (73.4 kg)  05/08/17 161 lb (73 kg)  04/11/17 159 lb 8 oz (72.3 kg)   BP Readings from Last 3 Encounters:  06/14/17 120/88  05/08/17 (!) 138/99  04/11/17 (!) 146/100   Pulse Readings from Last 3 Encounters:  06/14/17 (!) 128  05/08/17 (!) 119  04/11/17 (!) 118    Renal function: CrCl cannot be calculated (Patient's most recent lab result is older than the maximum 21 days allowed.).  Past Medical History:  Diagnosis Date  . Anxiety   . Asthma   . Depression   . GERD (gastroesophageal reflux disease)   . Hypertension   . Kidney stones     Current Outpatient Medications on File Prior to Visit  Medication Sig Dispense Refill  . albuterol (PROVENTIL HFA;VENTOLIN HFA) 108 (90 Base) MCG/ACT inhaler Inhale 2 puffs into the lungs every 6 (six) hours as needed for wheezing or shortness of breath.    Marland Kitchen. albuterol (PROVENTIL) (2.5 MG/3ML) 0.083% nebulizer solution USE ONE VIAL IN NEBULIZER EVERY 4 TO 6 HOURS AS NEEDED FOR COUGH OR WHEEZING 50 vial 1  . diltiazem (CARDIZEM CD) 240 MG 24 hr capsule Take 1 capsule (240 mg total) daily by mouth. 30 capsule 6  . diphenhydrAMINE (BENADRYL) 25 MG tablet Take 50 mg by mouth at bedtime.    Marland Kitchen. ibuprofen (ADVIL,MOTRIN) 200 MG tablet Take 600 mg by mouth every 6 (six) hours as needed for headache.     . lisinopril-hydrochlorothiazide (PRINZIDE,ZESTORETIC) 20-25 MG tablet TAKE 1 TABLET BY MOUTH ONCE DAILY 90 tablet 1  . Multiple Vitamin (MULTIVITAMIN) tablet Take 1 tablet by mouth daily.    Marland Kitchen. omeprazole  (PRILOSEC) 20 MG capsule Take 20 mg by mouth daily.     . SYMBICORT 160-4.5 MCG/ACT inhaler Inhale 2 puffs into the lungs 2 (two) times daily. 1 Inhaler 3  . triamcinolone (NASACORT ALLERGY 24HR) 55 MCG/ACT AERO nasal inhaler Place 1 spray into the nose daily.    . valACYclovir (VALTREX) 1000 MG tablet Take one tablet daily. 30 tablet 11   No current facility-administered medications on file prior to visit.     Allergies  Allergen Reactions  . Dilaudid [Hydromorphone Hcl] Shortness Of Breath, Swelling and Other (See Comments)    Redness in hands and thorat.  . Morphine Hives    REACTION: per pt gives her hives    There were no vitals taken for this visit.  No problem-specific Assessment & Plan notes found for this encounter.   Donte Lenzo Rodriguez-Guzman PharmD, BCPS, CPP [facility] [address] [place] [phone] 07/27/2017 9:46 AM

## 2017-08-09 NOTE — Progress Notes (Signed)
08/10/2017 Laura Clay November 28, 1970 [id number]   HPI:  Laura Clay is a 47 y.o. female patient of Dr Herbie Baltimore, with a PMH below who presents today for hypertension clinic evaluation.  In addition to hypertension, her medical history is significant for sinus tachycardia and asthma.  Her hypertension appears to be well controlled at her last MD visit, however hs has had multiple OV in the past year for this.  She has had both systolic and diastolic hypertension, to the point where she had renal artery dopplers, which were negative for RAS.    At her last OV the amlodipine was switched to diltiazem to help combat tachycardia.  Because of the asthma Dr. Herbie Baltimore was hesitant to put her on a beta blocker.  In speaking with her, the asthma is fairly well controlled on twice daily Symbicort.  Today she reports some continued issues with tachycardia and no significant changes in her home blood pressure readings.    Blood Pressure Goal:  130/80  Current Medications:  Diltiazem 240 mg qd  Lisinopril hctz 20/25 mg qd:  Social Hx:  Patient is a former smoker, drinks alcohol socially  Home BP readings:  Has a newer home BP cuff, but did not bring with her today.  She notes that most of the readings were "normal" and her heart rate ranged form 80-100 most days.   Wt Readings from Last 3 Encounters:  06/14/17 161 lb 12.8 oz (73.4 kg)  05/08/17 161 lb (73 kg)  04/11/17 159 lb 8 oz (72.3 kg)   BP Readings from Last 3 Encounters:  08/10/17 (!) 130/100  06/14/17 120/88  05/08/17 (!) 138/99   Pulse Readings from Last 3 Encounters:  08/10/17 100  06/14/17 (!) 128  05/08/17 (!) 119    Current Outpatient Medications  Medication Sig Dispense Refill  . albuterol (PROVENTIL HFA;VENTOLIN HFA) 108 (90 Base) MCG/ACT inhaler Inhale 2 puffs into the lungs every 6 (six) hours as needed for wheezing or shortness of breath.    Marland Kitchen albuterol (PROVENTIL) (2.5 MG/3ML) 0.083% nebulizer solution USE ONE  VIAL IN NEBULIZER EVERY 4 TO 6 HOURS AS NEEDED FOR COUGH OR WHEEZING 50 vial 1  . diltiazem (CARDIZEM CD) 240 MG 24 hr capsule Take 1 capsule (240 mg total) daily by mouth. 30 capsule 6  . diltiazem (TIAZAC) 300 MG 24 hr capsule Take 1 capsule (300 mg total) by mouth daily. 30 capsule 1  . diphenhydrAMINE (BENADRYL) 25 MG tablet Take 50 mg by mouth at bedtime.    Marland Kitchen ibuprofen (ADVIL,MOTRIN) 200 MG tablet Take 600 mg by mouth every 6 (six) hours as needed for headache.     . lisinopril-hydrochlorothiazide (PRINZIDE,ZESTORETIC) 20-25 MG tablet TAKE 1 TABLET BY MOUTH ONCE DAILY 90 tablet 1  . Multiple Vitamin (MULTIVITAMIN) tablet Take 1 tablet by mouth daily.    Marland Kitchen omeprazole (PRILOSEC) 20 MG capsule Take 20 mg by mouth daily.     . SYMBICORT 160-4.5 MCG/ACT inhaler Inhale 2 puffs into the lungs 2 (two) times daily. 1 Inhaler 3  . triamcinolone (NASACORT ALLERGY 24HR) 55 MCG/ACT AERO nasal inhaler Place 1 spray into the nose daily.    . valACYclovir (VALTREX) 1000 MG tablet Take one tablet daily. 30 tablet 11   No current facility-administered medications for this visit.     Allergies  Allergen Reactions  . Dilaudid [Hydromorphone Hcl] Shortness Of Breath, Swelling and Other (See Comments)    Redness in hands and thorat.  . Morphine Hives  REACTION: per pt gives her hives    Past Medical History:  Diagnosis Date  . Anxiety   . Asthma   . Depression   . GERD (gastroesophageal reflux disease)   . Hypertension   . Kidney stones     Blood pressure (!) 130/100, pulse 100.  HTN (hypertension), accelerated  Patient with hypertension and sinus tachycardia, now on diltiazem 240 mg.  Her heart rate in the office today is at 100, although she doesn't notice any issues today.  Will increase the diltiazem to 300 mg and have her return in 3-4 weeks for follow up.  Can increase diltiazem to 360 mg if needed at that time, may also need to consider a beta 1 selective blocker.     Laura Clay  PharmD CPP [facility] [address] [place], [place] [address] [phone]

## 2017-08-10 ENCOUNTER — Ambulatory Visit (INDEPENDENT_AMBULATORY_CARE_PROVIDER_SITE_OTHER): Payer: 59 | Admitting: Pharmacist Clinician (PhC)/ Clinical Pharmacy Specialist

## 2017-08-10 DIAGNOSIS — I10 Essential (primary) hypertension: Secondary | ICD-10-CM

## 2017-08-10 MED ORDER — DILTIAZEM HCL ER BEADS 300 MG PO CP24: 300.0000 mg | ORAL_CAPSULE | Freq: Every day | ORAL | 1 refills | Status: DC

## 2017-08-10 NOTE — Patient Instructions (Signed)
Return for a a follow up appointment in 3 weeks  Your blood pressure today is 130/100 (goal is < 130/100)   Check your blood pressure at home daily and keep record of the readings.  Take your BP meds as follows:  Increase diltiazem to 300 mg once daily  Continue with all other medications  Bring all of your meds, your BP cuff and your record of home blood pressures to your next appointment.  Exercise as you're able, try to walk approximately 30 minutes per day.  Keep salt intake to a minimum, especially watch canned and prepared boxed foods.  Eat more fresh fruits and vegetables and fewer canned items.  Avoid eating in fast food restaurants.    HOW TO TAKE YOUR BLOOD PRESSURE: . Rest 5 minutes before taking your blood pressure. .  Don't smoke or drink caffeinated beverages for at least 30 minutes before. . Take your blood pressure before (not after) you eat. . Sit comfortably with your back supported and both feet on the floor (don't cross your legs). . Elevate your arm to heart level on a table or a desk. . Use the proper sized cuff. It should fit smoothly and snugly around your bare upper arm. There should be enough room to slip a fingertip under the cuff. The bottom edge of the cuff should be 1 inch above the crease of the elbow. . Ideally, take 3 measurements at one sitting and record the average.

## 2017-08-10 NOTE — Assessment & Plan Note (Addendum)
Patient with hypertension and sinus tachycardia, now on diltiazem 240 mg.  Her heart rate in the office today is at 100, although she doesn't notice any issues today.  Will increase the diltiazem to 300 mg and have her return in 3-4 weeks for follow up.  Can increase diltiazem to 360 mg if needed at that time, may also need to consider a beta 1 selective blocker.

## 2017-08-15 ENCOUNTER — Encounter: Payer: Self-pay | Admitting: Pharmacist Clinician (PhC)/ Clinical Pharmacy Specialist

## 2017-08-31 ENCOUNTER — Ambulatory Visit: Payer: 59

## 2017-09-07 ENCOUNTER — Ambulatory Visit (INDEPENDENT_AMBULATORY_CARE_PROVIDER_SITE_OTHER): Payer: 59 | Admitting: Pharmacist

## 2017-09-07 VITALS — BP 122/90 | HR 96

## 2017-09-07 DIAGNOSIS — I10 Essential (primary) hypertension: Secondary | ICD-10-CM | POA: Diagnosis not present

## 2017-09-07 NOTE — Patient Instructions (Addendum)
Return for a follow up appointment as needed  Check your blood pressure at home daily (if able) and keep record of the readings.  Take your BP meds as follows: *CONTINUE all medication without changes*  Bring all of your meds, your BP cuff and your record of home blood pressures to your next appointment.  Exercise as you're able, try to walk approximately 30 minutes per day.  Keep salt intake to a minimum, especially watch canned and prepared boxed foods.  Eat more fresh fruits and vegetables and fewer canned items.  Avoid eating in fast food restaurants.    HOW TO TAKE YOUR BLOOD PRESSURE: . Rest 5 minutes before taking your blood pressure. .  Don't smoke or drink caffeinated beverages for at least 30 minutes before. . Take your blood pressure before (not after) you eat. . Sit comfortably with your back supported and both feet on the floor (don't cross your legs). . Elevate your arm to heart level on a table or a desk. . Use the proper sized cuff. It should fit smoothly and snugly around your bare upper arm. There should be enough room to slip a fingertip under the cuff. The bottom edge of the cuff should be 1 inch above the crease of the elbow. . Ideally, take 3 measurements at one sitting and record the average.    

## 2017-09-07 NOTE — Progress Notes (Signed)
HPI:  Laura Clay is a 47 y.o. female patient of Dr Herbie Baltimore, with a PMH below who presents today for hypertension clinic follow up.  In addition to hypertension, her medical history is significant for sinus tachycardia and asthma.  She has had both systolic and diastolic hypertension, to the point where she had renal artery dopplers, which were negative for RAS.  At her last OV diltiazem dose was increased to 300 mg daily to help with BP and tachycardia.   Patient denies adverse drug reaction to current therapy and reports felling well.  Patient also expressed desire of further f/u with PCP due to high co-pay when seen in our office.  Blood Pressure Goal:  130/80  Current Medications:  Diltiazem 300mg  daily  Lisinopril - hctz 20/25 mg daily  Exercise: not much walking with current winter weather, Daily walks when weather permits  Social Hx:  Patient is a former smoker, drinks alcohol socially  Home BP readings:  120/87  HR88  119/87  HR88  121/87  HR87  117/86  HR87  115/86  HR89  116/85  HR88 Average BP  = 118/86 HOME BP machine accurate within 10mg g/Hg when compared to manual readiings  Wt Readings from Last 3 Encounters:  06/14/17 161 lb 12.8 oz (73.4 kg)  05/08/17 161 lb (73 kg)  04/11/17 159 lb 8 oz (72.3 kg)   BP Readings from Last 3 Encounters:  09/07/17 122/90  08/10/17 (!) 130/100  06/14/17 120/88   Pulse Readings from Last 3 Encounters:  09/07/17 96  08/10/17 100  06/14/17 (!) 128    Current Outpatient Medications  Medication Sig Dispense Refill  . albuterol (PROVENTIL HFA;VENTOLIN HFA) 108 (90 Base) MCG/ACT inhaler Inhale 2 puffs into the lungs every 6 (six) hours as needed for wheezing or shortness of breath.    Marland Kitchen albuterol (PROVENTIL) (2.5 MG/3ML) 0.083% nebulizer solution USE ONE VIAL IN NEBULIZER EVERY 4 TO 6 HOURS AS NEEDED FOR COUGH OR WHEEZING 50 vial 1  . diltiazem (CARDIZEM CD) 240 MG 24 hr capsule Take 1 capsule (240 mg total) daily by  mouth. 30 capsule 6  . diltiazem (TIAZAC) 300 MG 24 hr capsule Take 1 capsule (300 mg total) by mouth daily. 30 capsule 1  . diphenhydrAMINE (BENADRYL) 25 MG tablet Take 50 mg by mouth at bedtime.    Marland Kitchen ibuprofen (ADVIL,MOTRIN) 200 MG tablet Take 600 mg by mouth every 6 (six) hours as needed for headache.     . lisinopril-hydrochlorothiazide (PRINZIDE,ZESTORETIC) 20-25 MG tablet TAKE 1 TABLET BY MOUTH ONCE DAILY 90 tablet 1  . Multiple Vitamin (MULTIVITAMIN) tablet Take 1 tablet by mouth daily.    Marland Kitchen omeprazole (PRILOSEC) 20 MG capsule Take 20 mg by mouth daily.     . SYMBICORT 160-4.5 MCG/ACT inhaler Inhale 2 puffs into the lungs 2 (two) times daily. 1 Inhaler 3  . triamcinolone (NASACORT ALLERGY 24HR) 55 MCG/ACT AERO nasal inhaler Place 1 spray into the nose daily.    . valACYclovir (VALTREX) 1000 MG tablet Take one tablet daily. 30 tablet 11   No current facility-administered medications for this visit.     Allergies  Allergen Reactions  . Dilaudid [Hydromorphone Hcl] Shortness Of Breath, Swelling and Other (See Comments)    Redness in hands and thorat.  . Morphine Hives    REACTION: per pt gives her hives    Past Medical History:  Diagnosis Date  . Anxiety   . Asthma   . Depression   .  GERD (gastroesophageal reflux disease)   . Hypertension   . Kidney stones     Blood pressure 122/90, pulse 96, SpO2 97 %.  HTN (hypertension), accelerated  Blood pressure is better controlled today in office and at home with an average reading of 118/86. Patient's home monitor was calibrated today and find to be accurate within 10mmHg from manual reading. Heart rate also remains appropriate between 87 to 98 bpm. Will continue current therapy without changes and follow up with HTN clinic as needed.   Chakia Counts Rodriguez-Guzman PharmD, BCPS, CPP [facility] [address] [place] [phone] 09/10/2017 1:51 PM

## 2017-09-08 ENCOUNTER — Encounter: Payer: Self-pay | Admitting: Pharmacist

## 2017-09-08 NOTE — Assessment & Plan Note (Addendum)
Blood pressure is better controlled today in office and at home with an average reading of 118/86. Patient's home monitor was calibrated today and find to be accurate within from manual reading. Heart rate also remains appropriate between 87 to 98 bpm. Will continue current therapy without changes and follow up with HTN clinic as needed.

## 2017-10-09 ENCOUNTER — Other Ambulatory Visit: Payer: Self-pay | Admitting: Cardiology

## 2017-10-12 ENCOUNTER — Ambulatory Visit: Payer: 59 | Admitting: Cardiology

## 2017-10-27 ENCOUNTER — Other Ambulatory Visit: Payer: Self-pay | Admitting: Family Medicine

## 2017-12-06 ENCOUNTER — Other Ambulatory Visit: Payer: Self-pay | Admitting: Physician Assistant

## 2017-12-08 ENCOUNTER — Telehealth: Payer: Self-pay | Admitting: Physician Assistant

## 2017-12-08 NOTE — Telephone Encounter (Signed)
Copied from CRM [id number]. Topic: Quick Communication - Rx Refill/Question >> Dec 08, 2017  3:28 PM Rudi Coco, [place] wrote: Medication: lisinopril-hydrochlorothiazide (PRINZIDE,ZESTORETIC) 20-25 MG tablet [[id number]]  Has the patient contacted their pharmacy? yes (Agent: If no, request that the patient contact the pharmacy for the refill.) Preferred Pharmacy (with phone number or street name): [facility] [address] [place], [place] - [address] [place] [address] Phone: [phone] Fax: [phone]   Agent: Please be advised that RX refills may take up to 3 business days. We ask that you follow-up with your pharmacy.

## 2017-12-11 MED ORDER — LISINOPRIL-HYDROCHLOROTHIAZIDE 20-25 MG PO TABS: 1.0000 | ORAL_TABLET | Freq: Every day | ORAL | 0 refills | Status: DC

## 2018-02-09 ENCOUNTER — Other Ambulatory Visit: Payer: Self-pay | Admitting: Physician Assistant

## 2018-03-13 ENCOUNTER — Other Ambulatory Visit: Payer: Self-pay | Admitting: Physician Assistant

## 2018-03-14 ENCOUNTER — Other Ambulatory Visit: Payer: Self-pay | Admitting: Physician Assistant

## 2018-03-14 MED ORDER — LISINOPRIL-HYDROCHLOROTHIAZIDE 20-25 MG PO TABS: 1.0000 | ORAL_TABLET | Freq: Every day | ORAL | 0 refills | Status: DC

## 2018-03-14 NOTE — Telephone Encounter (Signed)
Copied from CRM [id number]. Topic: General - Other >> Mar 14, 2018  3:21 PM Leafy Roobinson, Norma J wrote: Reason for CRM: pt is calling and has made an appt for 03-16-18. Pt would like a refill on lisinopril-hctz. [facility] [address]

## 2018-03-14 NOTE — Telephone Encounter (Signed)
See note

## 2018-03-14 NOTE — Telephone Encounter (Signed)
Spoke to pt, told her 30 day supply of blood pressure medication sent to pharmacy. Told her must keep follow up appointment. Pt verbalized understanding.

## 2018-03-16 ENCOUNTER — Encounter: Payer: Self-pay | Admitting: Physician Assistant

## 2018-03-16 ENCOUNTER — Ambulatory Visit (INDEPENDENT_AMBULATORY_CARE_PROVIDER_SITE_OTHER): Payer: 59 | Admitting: Physician Assistant

## 2018-03-16 VITALS — BP 138/86 | HR 106 | Temp 98.7°F | Ht 66.0 in | Wt 161.0 lb

## 2018-03-16 DIAGNOSIS — I10 Essential (primary) hypertension: Secondary | ICD-10-CM

## 2018-03-16 MED ORDER — DILTIAZEM HCL ER BEADS 300 MG PO CP24: 300.0000 mg | ORAL_CAPSULE | Freq: Every day | ORAL | 1 refills | Status: DC

## 2018-03-16 MED ORDER — LISINOPRIL-HYDROCHLOROTHIAZIDE 20-25 MG PO TABS: 1.0000 | ORAL_TABLET | Freq: Every day | ORAL | 1 refills | Status: DC

## 2018-03-16 NOTE — Patient Instructions (Signed)
It was great to see you!  Continue current medication regimen  Let's follow-up in 6 months, sooner if you have concerns.  Take care,  Jarold MottoSamantha Riyan Gavina PA-C

## 2018-03-16 NOTE — Assessment & Plan Note (Addendum)
Well controlled. Continue current regimen, no changes. Follow-up in 6 months.

## 2018-03-16 NOTE — Progress Notes (Signed)
Laura Clay is a 47 y.o. female here for a follow up of a pre-existing problem.   History of Present Illness:   Chief Complaint  Patient presents with  . Follow-up    HPI  Currently taking Diltiazem 300 mg daily, Lisinopril-HCTZ 20-25 mg. Does not regularly check blood pressures at home. Patient denies chest pain, SOB, blurred vision, dizziness, unusual headaches, lower leg swelling. Patient is compliant with medication. Denies excessive caffeine intake, stimulant usage, excessive alcohol intake, or increase in salt consumption.   Past Medical History:  Diagnosis Date  . Anxiety   . Asthma   . Depression   . GERD (gastroesophageal reflux disease)   . Hypertension   . Kidney stones      Social History   Socioeconomic History  . Marital status: Widowed    Spouse name: Not on file  . Number of children: Not on file  . Years of education: Not on file  . Highest education level: Not on file  Occupational History  . Not on file  Social Needs  . Financial resource strain: Not on file  . Food insecurity:    Worry: Not on file    Inability: Not on file  . Transportation needs:    Medical: Not on file    Non-medical: Not on file  Tobacco Use  . Smoking status: Former Smoker    Years: 12.00  . Smokeless tobacco: Never Used  Substance and Sexual Activity  . Alcohol use: Yes    Comment: Socially  . Drug use: No  . Sexual activity: Yes    Partners: Male    Birth control/protection: Surgical  Lifestyle  . Physical activity:    Days per week: Not on file    Minutes per session: Not on file  . Stress: Not on file  Relationships  . Social connections:    Talks on phone: Not on file    Gets together: Not on file    Attends religious service: Not on file    Active member of club or organization: Not on file    Attends meetings of clubs or organizations: Not on file    Relationship status: Not on file  . Intimate partner violence:    Fear of current or ex partner:  Not on file    Emotionally abused: Not on file    Physically abused: Not on file    Forced sexual activity: Not on file  Other Topics Concern  . Not on file  Social History Narrative   Widowed.    Works as an Print production planneroffice manager for [employer]   Property manager real estate   2 daughters, 18 and 20 - both still live at home   Quit smoking in February 2015   4-5 beers a week   She does routine rolling and walking for 45 minutes to an hour 3-4 days a week    Past Surgical History:  Procedure Laterality Date  . ABDOMINAL HYSTERECTOMY  2000   Abnormal PAPs  . BUNIONECTOMY Left   . OVARIAN CYST REMOVAL      Family History  Problem Relation Age of Onset  . Breast cancer Maternal Grandmother   . Colon cancer Maternal Aunt   . Colon polyps Cousin   . Colon polyps Maternal Aunt   . Diabetes Father   . Kidney disease Father   . Congestive Heart Failure Father [id number]  . Kidney cancer Maternal Uncle   . Hypertension Mother   . Valvular heart  disease Mother        ? Mitral Regurg --> s/p Operative repair.    Allergies  Allergen Reactions  . Dilaudid [Hydromorphone Hcl] Shortness Of Breath, Swelling and Other (See Comments)    Redness in hands and thorat.  . Morphine Hives    REACTION: per pt gives her hives    Current Medications:   Current Outpatient Medications:  .  albuterol (PROVENTIL HFA;VENTOLIN HFA) 108 (90 Base) MCG/ACT inhaler, Inhale 2 puffs into the lungs every 6 (six) hours as needed for wheezing or shortness of breath., Disp: , Rfl:  .  albuterol (PROVENTIL) (2.5 MG/3ML) 0.083% nebulizer solution, USE ONE VIAL IN NEBULIZER EVERY 4 TO 6 HOURS AS NEEDED FOR COUGH OR WHEEZING, Disp: 50 vial, Rfl: 1 .  diltiazem (TIAZAC) 300 MG 24 hr capsule, Take 1 capsule (300 mg total) by mouth daily., Disp: 90 capsule, Rfl: 1 .  diphenhydrAMINE (BENADRYL) 25 MG tablet, Take 50 mg by mouth at bedtime., Disp: , Rfl:  .  ibuprofen (ADVIL,MOTRIN) 200 MG tablet, Take 600 mg by mouth  every 6 (six) hours as needed for headache. , Disp: , Rfl:  .  lisinopril-hydrochlorothiazide (PRINZIDE,ZESTORETIC) 20-25 MG tablet, Take 1 tablet by mouth daily., Disp: 90 tablet, Rfl: 1 .  Multiple Vitamin (MULTIVITAMIN) tablet, Take 1 tablet by mouth daily., Disp: , Rfl:  .  omeprazole (PRILOSEC) 20 MG capsule, Take 20 mg by mouth daily. , Disp: , Rfl:  .  SYMBICORT 160-4.5 MCG/ACT inhaler, INHALE 2 PUFFS BY MOUTH TWICE DAILY, Disp: 3 Inhaler, Rfl: 1 .  triamcinolone (NASACORT ALLERGY 24HR) 55 MCG/ACT AERO nasal inhaler, Place 1 spray into the nose daily., Disp: , Rfl:  .  valACYclovir (VALTREX) 1000 MG tablet, Take one tablet daily., Disp: 30 tablet, Rfl: 11   Review of Systems:   ROS  Negative unless otherwise specified per HPI.   Vitals:   Vitals:   03/16/18 1520  BP: 138/86  Pulse: (!) 106  Temp: 98.7 F (37.1 C)  TempSrc: Oral  SpO2: 96%  Weight: 161 lb (73 kg)  Height: 5\' 6"  (1.676 m)     Body mass index is 25.99 kg/m.  Physical Exam:   Physical Exam  Constitutional: She appears well-developed. She is cooperative.  Non-toxic appearance. She does not have a sickly appearance. She does not appear ill. No distress.  Cardiovascular: Regular rhythm, S1 normal, S2 normal, normal heart sounds and normal pulses. Tachycardia present.  No LE edema  Pulmonary/Chest: Effort normal and breath sounds normal.  Neurological: She is alert. GCS eye subscore is 4. GCS verbal subscore is 5. GCS motor subscore is 6.  Skin: Skin is warm, dry and intact.  Psychiatric: She has a normal mood and affect. Her speech is normal and behavior is normal.  Nursing note and vitals reviewed.   Assessment and Plan:    Problem List Items Addressed This Visit      Cardiovascular and Mediastinum   HTN (hypertension), accelerated  - Primary (Chronic)    Well controlled. Continue current regimen, no changes. Follow-up in 6 months.      Relevant Medications   lisinopril-hydrochlorothiazide  (PRINZIDE,ZESTORETIC) 20-25 MG tablet   diltiazem (TIAZAC) 300 MG 24 hr capsule      . Reviewed expectations re: course of current medical issues. . Discussed self-management of symptoms. . Outlined signs and symptoms indicating need for more acute intervention. . Patient verbalized understanding and all questions were answered. . See orders for this visit as documented in  the electronic medical record. . Patient received an After-Visit Summary.   Jarold Motto, PA-C

## 2018-04-07 ENCOUNTER — Other Ambulatory Visit: Payer: Self-pay | Admitting: Physician Assistant

## 2018-04-07 ENCOUNTER — Other Ambulatory Visit: Payer: Self-pay | Admitting: Family Medicine

## 2018-04-07 MED ORDER — BUDESONIDE-FORMOTEROL FUMARATE 160-4.5 MCG/ACT IN AERO: 2.0000 | INHALATION_SPRAY | Freq: Two times a day (BID) | RESPIRATORY_TRACT | 0 refills | Status: DC

## 2018-04-08 ENCOUNTER — Telehealth: Payer: Self-pay | Admitting: Family Medicine

## 2018-04-08 NOTE — Telephone Encounter (Signed)
Late entry.  Pt called answering svc requesting refill on symbicort inhaler. Given limited one time refill by this provider given holiday wknd.   Abbe Amsterdam, MD

## 2018-04-11 ENCOUNTER — Telehealth: Payer: Self-pay | Admitting: Physician Assistant

## 2018-04-11 NOTE — Telephone Encounter (Signed)
PA has been started for patient.   Left message on patients phone to let her know.

## 2018-04-11 NOTE — Telephone Encounter (Signed)
Noted  

## 2018-04-11 NOTE — Telephone Encounter (Signed)
Per Cover my Meds this medication does not need a PA. I spoke with the pharmacy and they said that the reason they sent PA is because Copay was $80.

## 2018-04-11 NOTE — Telephone Encounter (Signed)
MEDICATION: SYMBICORT 160-4.5 MCG/ACT inhaler  PHARMACY:   [facility] [address] [place], [place] - [address] [phone] (Phone) [phone] (Fax)   IS THIS A 90 DAY SUPPLY : unknown  IS PATIENT OUT OF MEDICATION: unknown  IF NOT; HOW MUCH IS LEFT:   LAST APPOINTMENT DATE: @8 /31/2019  NEXT APPOINTMENT DATE:@Visit  date not found  OTHER COMMENTS: Please refill if not already done so   **Let patient know to contact pharmacy at the end of the day to make sure medication is ready. **  ** Please notify patient to allow 48-72 hours to process**  **Encourage patient to contact the pharmacy for refills or they can request refills through [facility]**

## 2018-06-08 ENCOUNTER — Ambulatory Visit (INDEPENDENT_AMBULATORY_CARE_PROVIDER_SITE_OTHER): Payer: 59 | Admitting: Family Medicine

## 2018-06-08 ENCOUNTER — Encounter: Payer: Self-pay | Admitting: Family Medicine

## 2018-06-08 VITALS — BP 124/84 | HR 88 | Temp 98.5°F | Ht 66.0 in | Wt 159.2 lb

## 2018-06-08 DIAGNOSIS — N3000 Acute cystitis without hematuria: Secondary | ICD-10-CM | POA: Diagnosis not present

## 2018-06-08 LAB — POCT URINALYSIS DIPSTICK
Bilirubin, UA: NEGATIVE
Glucose, UA: NEGATIVE
Ketones, UA: NEGATIVE
NITRITE UA: NEGATIVE
PH UA: 6 (ref 5.0–8.0)
Protein, UA: NEGATIVE
RBC UA: NEGATIVE
SPEC GRAV UA: 1.01 (ref 1.010–1.025)
UROBILINOGEN UA: 0.2 U/dL

## 2018-06-08 MED ORDER — SULFAMETHOXAZOLE-TRIMETHOPRIM 800-160 MG PO TABS: 1.0000 | ORAL_TABLET | Freq: Two times a day (BID) | ORAL | 0 refills | Status: DC

## 2018-06-08 MED ORDER — PHENAZOPYRIDINE HCL 100 MG PO TABS: 100.0000 mg | ORAL_TABLET | Freq: Three times a day (TID) | ORAL | 0 refills | Status: DC | PRN

## 2018-06-08 NOTE — Patient Instructions (Signed)
Starting you on bactrim twice a day for 5 day for urine infection Will f/u on culture to make sure I don't need to change your antibiotics Pyridium is just a pill to help control symptoms. Take as needed   Urinary Tract Infection, Adult A urinary tract infection (UTI) is an infection of any part of the urinary tract. The urinary tract includes the:  Kidneys.  Ureters.  Bladder.  Urethra.  These organs make, store, and get rid of pee (urine) in the body. Follow these instructions at home:  Take over-the-counter and prescription medicines only as told by your doctor.  If you were prescribed an antibiotic medicine, take it as told by your doctor. Do not stop taking the antibiotic even if you start to feel better.  Avoid the following drinks: ? Alcohol. ? Caffeine. ? Tea. ? Carbonated drinks.  Drink enough fluid to keep your pee clear or pale yellow.  Keep all follow-up visits as told by your doctor. This is important.  Make sure to: ? Empty your bladder often and completely. Do not to hold pee for long periods of time. ? Empty your bladder before and after sex. ? Wipe from front to back after a bowel movement if you are female. Use each tissue one time when you wipe. Contact a doctor if:  You have back pain.  You have a fever.  You feel sick to your stomach (nauseous).  You throw up (vomit).  Your symptoms do not get better after 3 days.  Your symptoms go away and then come back. Get help right away if:  You have very bad back pain.  You have very bad lower belly (abdominal) pain.  You are throwing up and cannot keep down any medicines or water. This information is not intended to replace advice given to you by your health care provider. Make sure you discuss any questions you have with your health care provider. Document Released: 01/11/2008 Document Revised: 12/31/2015 Document Reviewed: 06/15/2015 Elsevier Interactive Patient Education  [employer].

## 2018-06-08 NOTE — Progress Notes (Signed)
Patient: Laura Clay MRN: [id number] DOB: 1970-10-20 PCP: Jarold Motto, PA     Subjective:  Chief Complaint  Patient presents with  . poss UTI    cloudy urine, urinary frequency    HPI: The patient is a 47 y.o. female who presents today for possible UTI. She stated having symptoms about 3 weeks ago. She has been trying to treat with cranberry pills and is tired of this. She has cloudy urine, frequent urination (like 3x/night) and some urgency. No CVA tenderness and no fever/chills. She has mild dysuria and mild odor. She can no visibly see any blood. No hx of recurrent UTI. She has no abdominal pain.   Review of Systems  Constitutional: Positive for chills. Negative for fever.  Respiratory: Negative for cough and shortness of breath.   Cardiovascular: Negative for chest pain and palpitations.  Gastrointestinal: Positive for abdominal pain (suprapubic ).  Genitourinary: Positive for dysuria, frequency and urgency. Negative for dyspareunia, flank pain, hematuria, pelvic pain and vaginal discharge.  Musculoskeletal: Negative for back pain.    Allergies Patient is allergic to dilaudid [hydromorphone hcl] and morphine.  Past Medical History Patient  has a past medical history of Anxiety, Asthma, Depression, GERD (gastroesophageal reflux disease), Hypertension, and Kidney stones.  Surgical History Patient  has a past surgical history that includes Ovarian cyst removal; Bunionectomy (Left); and Abdominal hysterectomy (2000).  Family History Pateint's family history includes Breast cancer in her maternal grandmother; Colon cancer in her maternal aunt; Colon polyps in her cousin and maternal aunt; Congestive Heart Failure (age of onset: 74) in her father; Diabetes in her father; Hypertension in her mother; Kidney cancer in her maternal uncle; Kidney disease in her father; Valvular heart disease in her mother.  Social History Patient  reports that she has quit smoking. She quit  after 12.00 years of use. She has never used smokeless tobacco. She reports that she drinks alcohol. She reports that she does not use drugs.    Objective: Vitals:   06/08/18 1345  BP: 124/84  Pulse: 88  Temp: 98.5 F (36.9 C)  TempSrc: Oral  SpO2: 98%  Weight: 159 lb 3.2 oz (72.2 kg)  Height: 5\' 6"  (1.676 m)    Body mass index is 25.7 kg/m.  Physical Exam  Constitutional: She is oriented to person, place, and time. She appears well-developed and well-nourished.  Cardiovascular: Normal rate, regular rhythm and normal heart sounds.  Pulmonary/Chest: Effort normal and breath sounds normal.  Abdominal: Soft. Bowel sounds are normal. There is tenderness (mild suprapubically ).  Musculoskeletal:  No cva tenderness bilaterally   Neurological: She is alert and oriented to person, place, and time.  Vitals reviewed.      Assessment/plan: 1. Acute cystitis without hematuria Course of bactrim for uncomplicated uti. Pyridium prn. Discussed cranberry more preventative and does not help treat. Push fluids. Void after sex. Fever/chills/ n/v/ or flank pain-ER. Will f/u on culture and let her know if I need to change abx.  - POCT Urinalysis Dipstick - Urine Culture      Return if symptoms worsen or fail to improve.    Orland Mustard, MD [facility]   06/08/2018

## 2018-06-11 ENCOUNTER — Other Ambulatory Visit: Payer: Self-pay | Admitting: Family Medicine

## 2018-06-11 LAB — URINE CULTURE
MICRO NUMBER:: 91317621
SPECIMEN QUALITY:: ADEQUATE

## 2018-06-11 MED ORDER — NITROFURANTOIN MONOHYD MACRO 100 MG PO CAPS: 100.0000 mg | ORAL_CAPSULE | Freq: Two times a day (BID) | ORAL | 0 refills | Status: DC

## 2018-06-19 ENCOUNTER — Ambulatory Visit (INDEPENDENT_AMBULATORY_CARE_PROVIDER_SITE_OTHER): Payer: 59 | Admitting: Allergy and Immunology

## 2018-06-19 ENCOUNTER — Encounter: Payer: Self-pay | Admitting: Allergy and Immunology

## 2018-06-19 VITALS — BP 112/78 | HR 96 | Temp 99.5°F | Resp 24 | Ht 64.75 in | Wt 153.8 lb

## 2018-06-19 DIAGNOSIS — K219 Gastro-esophageal reflux disease without esophagitis: Secondary | ICD-10-CM | POA: Diagnosis not present

## 2018-06-19 DIAGNOSIS — J3089 Other allergic rhinitis: Secondary | ICD-10-CM

## 2018-06-19 DIAGNOSIS — J4541 Moderate persistent asthma with (acute) exacerbation: Secondary | ICD-10-CM

## 2018-06-19 MED ORDER — METHYLPREDNISOLONE ACETATE 80 MG/ML IJ SUSP
80.0000 mg | Freq: Once | INTRAMUSCULAR | Status: AC
  Administered 2018-06-19: 80 mg via INTRAMUSCULAR

## 2018-06-19 NOTE — Patient Instructions (Addendum)
  1. Depomedrol 80 IM, Prednisone 40mg , and Duoneb delivered in clinic today, then Prednisone 10mg  tablet - 2 tablets once a day for 5 days  2. Continue Symbicort 160 two inhalations two times per day    3. Continue OTC Nasacort one spray each nostril 2 time per day   4. Continue omeprazole 20 one tablet one time per day  5. Use ProAir Respiclick (Coupon) 2 inhalations every 4-6 hours or albuterol nebulization every 4-6 hours if needed.  6. Use OTC antihistamine and mucinex DM if needed  7. Return in 6 months or earlier if problem

## 2018-06-19 NOTE — Progress Notes (Signed)
Follow-up Note   Referring Provider: Jarold Clay, Samantha, PA Primary Provider: Jarold Clay, Samantha, PA Date of Office Visit: 06/19/2018  Subjective:   Laura Clay (DOB: November 15, 1970) is a 47 y.o. female who [facility] in re-evaluation of the following:  HPI: Laura Clay returns to this clinic in reevaluation of asthma and allergic rhinoconjunctivitis and a history of reflux.  I have not seen her in this clinic since 08 November 2016.  She has really done very well while consistently using Symbicort and Nasacort and omeprazole and has had very little issues with her respiratory track and rarely uses a short acting bronchodilator and has not required a systemic steroid or antibiotic to treat any type of respiratory tract issue.  Her reflux was under excellent control while consistently using omeprazole.  Unfortunately, she is being exposed to a large amount of people at work who are "sick".  This Sunday she felt really bad and very fatigued and started to develop coughing and head congestion and her head hurt and then she developed very significant coughing and wheezing and has had use her bronchodilator multiple times a day for the past several days.  She does not have any ugly nasal discharge and she does not have any ugly sputum production or significant chest pain or fever.  She is actually feeling a little bit better today even though her airways a little bit worse.  Allergies as of 06/19/2018      Reactions   Dilaudid [hydromorphone Hcl] Shortness Of Breath, Swelling, Other (See Comments)   Redness in hands and thorat.   Morphine Hives   REACTION: per pt gives her hives      Medication List      albuterol 108 (90 Base) MCG/ACT inhaler Commonly known as:  PROVENTIL HFA;VENTOLIN HFA Inhale 2 puffs into the lungs every 6 (six) hours as needed for wheezing or shortness of breath.   albuterol (2.5 MG/3ML) 0.083% nebulizer solution Commonly known  as:  PROVENTIL USE ONE VIAL IN NEBULIZER EVERY 4 TO 6 HOURS AS NEEDED FOR COUGH OR WHEEZING   BENADRYL 25 MG tablet Generic drug:  diphenhydrAMINE Take 50 mg by mouth at bedtime.   diltiazem 300 MG 24 hr capsule Commonly known as:  TIAZAC Take 1 capsule (300 mg total) by mouth daily.   ibuprofen 200 MG tablet Commonly known as:  ADVIL,MOTRIN Take 600 mg by mouth every 6 (six) hours as needed for headache.   lisinopril-hydrochlorothiazide 20-25 MG tablet Commonly known as:  PRINZIDE,ZESTORETIC Take 1 tablet by mouth daily.   multivitamin tablet Take 1 tablet by mouth daily.   NASACORT ALLERGY 24HR 55 MCG/ACT Aero nasal inhaler Generic drug:  triamcinolone Place 1 spray into the nose daily.   omeprazole 20 MG capsule Commonly known as:  PRILOSEC Take 20 mg by mouth daily.   budesonide-formoterol 160-4.5 MCG/ACT inhaler Commonly known as:  SYMBICORT Inhale 2 puffs into the lungs 2 (two) times daily.   SYMBICORT 160-4.5 MCG/ACT inhaler Generic drug:  budesonide-formoterol INHALE 2 PUFFS INTO LUNGS TWICE DAILY   valACYclovir 1000 MG tablet Commonly known as:  VALTREX Take one tablet daily.       Past Medical History:  Diagnosis Date  . Anxiety   . Asthma   . Depression   . GERD (gastroesophageal reflux disease)   . Hypertension   . Kidney stones     Past Surgical History:  Procedure Laterality Date  . ABDOMINAL HYSTERECTOMY  2000  Abnormal PAPs  . BUNIONECTOMY Left   . OVARIAN CYST REMOVAL      Review of systems negative except as noted in HPI / PMHx or noted below:  Review of Systems  Constitutional: Negative.   HENT: Negative.   Eyes: Negative.   Respiratory: Negative.   Cardiovascular: Negative.   Gastrointestinal: Negative.   Genitourinary: Negative.   Musculoskeletal: Negative.   Skin: Negative.   Neurological: Negative.   Endo/Heme/Allergies: Negative.   Psychiatric/Behavioral: Negative.      Objective:   Vitals:   06/19/18 1429    BP: 112/78  Pulse: 96  Resp: (!) 24  Temp: 99.5 F (37.5 C)  SpO2: 92%   Height: 5' 4.75" (164.5 cm)  Weight: 153 lb 12.8 oz (69.8 kg)   Physical Exam  Constitutional:  Nasal voice  HENT:  Head: Normocephalic.  Right Ear: Tympanic membrane, external ear and ear canal normal.  Left Ear: Tympanic membrane, external ear and ear canal normal.  Nose: Mucosal edema (Erythematous) present. No rhinorrhea.  Mouth/Throat: Uvula is midline, oropharynx is clear and moist and mucous membranes are normal. No oropharyngeal exudate.  Eyes: Conjunctivae are normal.  Neck: Trachea normal. No tracheal tenderness present. No tracheal deviation present. No thyromegaly present.  Cardiovascular: Normal rate, regular rhythm, S1 normal, S2 normal and normal heart sounds.  No murmur heard. Pulmonary/Chest: No stridor. No respiratory distress. She has wheezes (Bilateral expiratory wheezes all lung fields). She has no rales.  Musculoskeletal: She exhibits no edema.  Lymphadenopathy:       Head (right side): No tonsillar adenopathy present.       Head (left side): No tonsillar adenopathy present.    She has no cervical adenopathy.  Neurological: She is alert.  Skin: No rash noted. She is not diaphoretic. No erythema. Nails show no clubbing.    Diagnostics:    Spirometry was performed and demonstrated an FEV1 of 0.85 at 28 % of predicted.  She had a difficult time performing this maneuver secondary to coughing.  Assessment and Plan:   1. Asthma, not well controlled, moderate persistent, with acute exacerbation   2. Perennial allergic rhinitis   3. Gastroesophageal reflux disease, esophagitis presence not specified     1. Depomedrol 80 IM, Prednisone 40mg , and Duoneb delivered in clinic today, then Prednisone 10mg  tablet - 2 tablets once a day for 5 days  2. Continue Symbicort 160 two inhalations two times per day    3. Continue OTC Nasacort one spray each nostril 2 time per day   4. Continue  omeprazole 20 one tablet one time per day  5. Use ProAir Respiclick (Coupon) 2 inhalations every 4-6 hours or albuterol nebulization every 4-6 hours if needed.  6. Use OTC antihistamine and mucinex DM if needed  7. Return in 6 months or earlier if problem  Mekia obviously has a respiratory tract flare most likely precipitated by a viral respiratory tract infection and we will treat her with anti-inflammatory agents as noted above.  She will continue to consistently use preventative medications for her airway inflammation and continue on a PPI for reflux as noted above.  I will see her back in this clinic in 6 months or earlier if there is a problem.  Laurette Schimke, MD Allergy / Immunology [facility]

## 2018-06-20 ENCOUNTER — Other Ambulatory Visit: Payer: Self-pay | Admitting: Allergy and Immunology

## 2018-06-20 ENCOUNTER — Encounter: Payer: Self-pay | Admitting: Allergy and Immunology

## 2018-06-21 NOTE — Addendum Note (Signed)
Addended by: Mliss FritzBLACK, Yavuz Kirby I on: 06/21/2018 07:18 AM   Modules accepted: Orders

## 2018-08-08 ENCOUNTER — Other Ambulatory Visit: Payer: Self-pay | Admitting: Physician Assistant

## 2018-10-07 ENCOUNTER — Other Ambulatory Visit: Payer: Self-pay | Admitting: Physician Assistant

## 2018-10-23 ENCOUNTER — Telehealth: Payer: Self-pay | Admitting: Allergy and Immunology

## 2018-10-23 NOTE — Telephone Encounter (Signed)
I called the patient. She started having symptoms (shortness of breath, sore throat, chest tightness, exhausted, dry cough, severe headache) that started 10-19-2018. She was at [facility] on Saturday. She did recive her flu vaccine. Patient asked what her symptoms sounded like and I informed he rthat I am not able to diagnose over the phone nor am I provider. I let her know that this is why we are having her go to her primary care doctor. Patient verbalized and acknowledged understanding of the recommendations.

## 2018-10-23 NOTE — Telephone Encounter (Signed)
I called the patient and did not receive an answer. I will try again in a few minutes.

## 2018-10-23 NOTE — Telephone Encounter (Signed)
Patient called and has shortness of breath, sore throat; I asked her to contact PCP, she said she rarely sees them. She doesn't know if it is sinus issues, asthma, or the Virus.

## 2018-10-28 ENCOUNTER — Other Ambulatory Visit: Payer: Self-pay | Admitting: Family Medicine

## 2018-10-29 ENCOUNTER — Encounter: Payer: Self-pay | Admitting: Family Medicine

## 2018-10-30 ENCOUNTER — Encounter: Payer: Self-pay | Admitting: Physician Assistant

## 2018-10-30 ENCOUNTER — Ambulatory Visit (INDEPENDENT_AMBULATORY_CARE_PROVIDER_SITE_OTHER): Payer: 59 | Admitting: Physician Assistant

## 2018-10-30 DIAGNOSIS — R300 Dysuria: Secondary | ICD-10-CM

## 2018-10-30 LAB — POC URINALSYSI DIPSTICK (AUTOMATED)
Bilirubin, UA: NEGATIVE
Blood, UA: POSITIVE
GLUCOSE UA: NEGATIVE
Ketones, UA: NEGATIVE
Nitrite, UA: POSITIVE
Protein, UA: NEGATIVE
SPEC GRAV UA: 1.01 (ref 1.010–1.025)
Urobilinogen, UA: 0.2 E.U./dL
pH, UA: 6 (ref 5.0–8.0)

## 2018-10-30 MED ORDER — DILTIAZEM HCL ER BEADS 300 MG PO CP24: 300.0000 mg | ORAL_CAPSULE | Freq: Every day | ORAL | 1 refills | Status: DC

## 2018-10-30 MED ORDER — BUDESONIDE-FORMOTEROL FUMARATE 160-4.5 MCG/ACT IN AERO: 2.0000 | INHALATION_SPRAY | Freq: Two times a day (BID) | RESPIRATORY_TRACT | 2 refills | Status: DC

## 2018-10-30 MED ORDER — LISINOPRIL-HYDROCHLOROTHIAZIDE 20-25 MG PO TABS: 1.0000 | ORAL_TABLET | Freq: Every day | ORAL | 1 refills | Status: DC

## 2018-10-30 MED ORDER — VALACYCLOVIR HCL 1 G PO TABS: ORAL_TABLET | ORAL | 1 refills | Status: DC

## 2018-10-30 MED ORDER — NITROFURANTOIN MONOHYD MACRO 100 MG PO CAPS: 100.0000 mg | ORAL_CAPSULE | Freq: Two times a day (BID) | ORAL | 0 refills | Status: DC

## 2018-10-30 NOTE — Progress Notes (Signed)
Virtual Visit via Video   I connected with Laura Clay on 10/30/18 at  9:00 AM EDT by a video enabled telemedicine application and verified that I am speaking with the correct person using two identifiers. Location patient: Home Location provider: [facility], Office Persons participating in the virtual visit: Jarold Motto, PA-C and Corky Mull, LPN, and Suheily Kautz  I discussed the limitations of evaluation and management by telemedicine and the availability of in person appointments. The patient expressed understanding and agreed to proceed.  Subjective:   I acted as a Neurosurgeon for [employer], [employer], LPN  HPI:  Dysuria Pt c/o dysuria and urgency with urination, started a week ago and has gotten worse. Pt took OTC bladder ease medication  with little relief. Denies fever or chills, low back pain. No vaginal discharge. Denies hematuria in urine. Pt states is having difficulty urinating due to burning and having nocturia 6-7 times a night. Pt has Hx of Kidney stones. Asked pt if she could come in to the office to give a urine specimen to do UA and culture.   ROS: See pertinent positives and negatives per HPI.  Patient Active Problem List   Diagnosis Date Noted  . Sinus tachycardia by electrocardiogram 05/08/2017  . Sinus tachycardia 07/09/2016  . Moderate persistent asthma with acute exacerbation   . Status asthmaticus 10/01/2015  . Leukocytosis 10/01/2015  . Hyperglycemia 10/01/2015  . Depression with anxiety 06/22/2012  . HTN (hypertension), accelerated  05/08/2012    Social History   Tobacco Use  . Smoking status: Former Smoker    Years: 12.00  . Smokeless tobacco: Never Used  Substance Use Topics  . Alcohol use: Yes    Comment: Socially    Current Outpatient Medications:  .  albuterol (PROVENTIL HFA;VENTOLIN HFA) 108 (90 Base) MCG/ACT inhaler, Inhale 2 puffs into the lungs every 6 (six) hours as needed for wheezing or shortness of breath., Disp:  , Rfl:  .  albuterol (PROVENTIL) (2.5 MG/3ML) 0.083% nebulizer solution, USE ONE VIAL IN NEBULIZER EVERY 4 TO 6 HOURS AS NEEDED FOR COUGH OR WHEEZING, Disp: 50 vial, Rfl: 1 .  budesonide-formoterol (SYMBICORT) 160-4.5 MCG/ACT inhaler, Inhale 2 puffs into the lungs 2 (two) times daily., Disp: 1 Inhaler, Rfl: 2 .  diltiazem (TIAZAC) 300 MG 24 hr capsule, Take 1 capsule (300 mg total) by mouth daily., Disp: 90 capsule, Rfl: 1 .  diphenhydrAMINE (BENADRYL) 25 MG tablet, Take 50 mg by mouth at bedtime., Disp: , Rfl:  .  ibuprofen (ADVIL,MOTRIN) 200 MG tablet, Take 600 mg by mouth every 6 (six) hours as needed for headache. , Disp: , Rfl:  .  lisinopril-hydrochlorothiazide (PRINZIDE,ZESTORETIC) 20-25 MG tablet, Take 1 tablet by mouth daily., Disp: 90 tablet, Rfl: 1 .  Multiple Vitamin (MULTIVITAMIN) tablet, Take 1 tablet by mouth daily., Disp: , Rfl:  .  omeprazole (PRILOSEC) 20 MG capsule, Take 20 mg by mouth daily. , Disp: , Rfl:  .  triamcinolone (NASACORT ALLERGY 24HR) 55 MCG/ACT AERO nasal inhaler, Place 1 spray into the nose daily., Disp: , Rfl:  .  valACYclovir (VALTREX) 1000 MG tablet, TAKE 1 TABLET BY MOUTH ONCE DAILY, Disp: 90 tablet, Rfl: 1 .  nitrofurantoin, macrocrystal-monohydrate, (MACROBID) 100 MG capsule, Take 1 capsule (100 mg total) by mouth 2 (two) times daily., Disp: 10 capsule, Rfl: 0  Allergies  Allergen Reactions  . Dilaudid [Hydromorphone Hcl] Shortness Of Breath, Swelling and Other (See Comments)    Redness in hands and thorat.  Marland Kitchen  Morphine Hives    REACTION: per pt gives her hives    Objective:   VITALS: Per patient if applicable: n/a GENERAL: Alert, appears well and in no acute distress. HEENT: Atraumatic, conjunctiva clear, no obvious abnormalities on inspection of external nose and ears. NECK: Normal movements of the head and neck. CARDIOPULMONARY: No increased WOB. Speaking in clear sentences. I:E ratio WNL.  MS: Moves all visible extremities without noticeable  abnormality. PSYCH: Pleasant and cooperative, well-groomed. Speech normal rate and rhythm. Affect is appropriate. Insight and judgement are appropriate. Attention is focused, linear, and appropriate.  NEURO: CN grossly intact. Oriented as arrived to appointment on time with no prompting. Moves both UE equally.  SKIN: No obvious lesions, wounds, erythema, or cyanosis noted on face or hands.  Assessment and Plan:   Tatina was seen today for dysuria.  Diagnoses and all orders for this visit:  Dysuria -     POCT urinalysis dipstick -     Urine Culture  Other orders -     lisinopril-hydrochlorothiazide (PRINZIDE,ZESTORETIC) 20-25 MG tablet; Take 1 tablet by mouth daily. -     diltiazem (TIAZAC) 300 MG 24 hr capsule; Take 1 capsule (300 mg total) by mouth daily. -     valACYclovir (VALTREX) 1000 MG tablet; TAKE 1 TABLET BY MOUTH ONCE DAILY -     budesonide-formoterol (SYMBICORT) 160-4.5 MCG/ACT inhaler; Inhale 2 puffs into the lungs 2 (two) times daily. -     nitrofurantoin, macrocrystal-monohydrate, (MACROBID) 100 MG capsule; Take 1 capsule (100 mg total) by mouth 2 (two) times daily.   No red flags based on discussion. Explained to pt the importance of getting a urine specimen to ensure she is on appropriate right antibiotic. Will start pt on Macrobid meanwhile to help with symptoms until culture returns. Pt will come into the office to give urine specimen within the hour. Worsening precautions reviewed.  . Reviewed expectations re: course of current medical issues. . Discussed self-management of symptoms. . Outlined signs and symptoms indicating need for more acute intervention. . Patient verbalized understanding and all questions were answered. Marland Kitchen Health Maintenance issues including appropriate healthy diet, exercise, and smoking avoidance were discussed with patient. . See orders for this visit as documented in the electronic medical record.  Jarold Motto, [place] 10/30/2018  Time spent  with the patient: 25 minutes, of which >50% was spent in obtaining information about her symptoms, reviewing her previous labs, evaluations, and treatments, counseling her about her condition (please see the discussed topics above), and developing a plan to further investigate it; she had a number of questions which I addressed.

## 2018-10-30 NOTE — Patient Instructions (Signed)
Urinary Tract Infection, Adult A urinary tract infection (UTI) is an infection of any part of the urinary tract. The urinary tract includes:  The kidneys.  The ureters.  The bladder.  The urethra. These organs make, store, and get rid of pee (urine) in the body. What are the causes? This is caused by germs (bacteria) in your genital area. These germs grow and cause swelling (inflammation) of your urinary tract. What increases the risk? You are more likely to develop this condition if:  You have a small, thin tube (catheter) to drain pee.  You cannot control when you pee or poop (incontinence).  You are female, and: ? You use these methods to prevent pregnancy: ? A medicine that kills sperm (spermicide). ? A device that blocks sperm (diaphragm). ? You have low levels of a female hormone (estrogen). ? You are pregnant.  You have genes that add to your risk.  You are sexually active.  You take antibiotic medicines.  You have trouble peeing because of: ? A prostate that is bigger than normal, if you are female. ? A blockage in the part of your body that drains pee from the bladder (urethra). ? A kidney stone. ? A nerve condition that affects your bladder (neurogenic bladder). ? Not getting enough to drink. ? Not peeing often enough.  You have other conditions, such as: ? Diabetes. ? A weak disease-fighting system (immune system). ? Sickle cell disease. ? Gout. ? Injury of the spine. What are the signs or symptoms? Symptoms of this condition include:  Needing to pee right away (urgently).  Peeing often.  Peeing small amounts often.  Pain or burning when peeing.  Blood in the pee.  Pee that smells bad or not like normal.  Trouble peeing.  Pee that is cloudy.  Fluid coming from the vagina, if you are female.  Pain in the belly or lower back. Other symptoms include:  Throwing up (vomiting).  No urge to eat.  Feeling mixed up (confused).  Being tired  and grouchy (irritable).  A fever.  Watery poop (diarrhea). How is this treated? This condition may be treated with:  Antibiotic medicine.  Other medicines.  Drinking enough water. Follow these instructions at home:  Medicines  Take over-the-counter and prescription medicines only as told by your doctor.  If you were prescribed an antibiotic medicine, take it as told by your doctor. Do not stop taking it even if you start to feel better. General instructions  Make sure you: ? Pee until your bladder is empty. ? Do not hold pee for a long time. ? Empty your bladder after sex. ? Wipe from front to back after pooping if you are a female. Use each tissue one time when you wipe.  Drink enough fluid to keep your pee pale yellow.  Keep all follow-up visits as told by your doctor. This is important. Contact a doctor if:  You do not get better after 1-2 days.  Your symptoms go away and then come back. Get help right away if:  You have very bad back pain.  You have very bad pain in your lower belly.  You have a fever.  You are sick to your stomach (nauseous).  You are throwing up. Summary  A urinary tract infection (UTI) is an infection of any part of the urinary tract.  This condition is caused by germs in your genital area.  There are many risk factors for a UTI. These include having a small, thin   tube to drain pee and not being able to control when you pee or poop.  Treatment includes antibiotic medicines for germs.  Drink enough fluid to keep your pee pale yellow. This information is not intended to replace advice given to you by your health care provider. Make sure you discuss any questions you have with your health care provider. Document Released: 01/11/2008 Document Revised: 02/01/2018 Document Reviewed: 02/01/2018 Elsevier Interactive Patient Education  2019 Elsevier Inc.  

## 2018-10-30 NOTE — Addendum Note (Signed)
Addended by: Young Berry T on: 10/30/2018 11:39 AM   Modules accepted: Orders

## 2018-10-30 NOTE — Addendum Note (Signed)
Addended by: Haynes Bast on: 10/30/2018 12:10 PM   Modules accepted: Orders

## 2018-11-01 LAB — URINE CULTURE
MICRO NUMBER:: 349139
SPECIMEN QUALITY:: ADEQUATE

## 2018-11-08 ENCOUNTER — Other Ambulatory Visit: Payer: Self-pay | Admitting: Physician Assistant

## 2018-11-08 MED ORDER — HYDROCHLOROTHIAZIDE 25 MG PO TABS: 25.0000 mg | ORAL_TABLET | Freq: Every day | ORAL | 0 refills | Status: DC

## 2018-11-08 MED ORDER — LISINOPRIL 20 MG PO TABS: 20.0000 mg | ORAL_TABLET | Freq: Every day | ORAL | 0 refills | Status: DC

## 2018-11-08 NOTE — Telephone Encounter (Signed)
New Rx's sent to pharmacy as requested.

## 2018-11-08 NOTE — Telephone Encounter (Signed)
See note

## 2018-11-08 NOTE — Telephone Encounter (Signed)
Copied from CRM [id number]. Topic: Quick Communication - See Telephone Encounter >> Nov 08, 2018  2:21 PM Jens Som A wrote: CRM for notification. See Telephone encounter for: 11/08/18. Eddie Pharmist at [facility] [address] [place], [place] - [address] [phone] (Phone) [phone] (Fax) Calling because request was denied via interface to separate lisinopril-hydrochlorothiazide (PRINZIDE,ZESTORETIC) 20-25 MG tablet [[id number]]  because on  back order. Please advise.  Wanting to make it two prescriptions not one.  Please advise. Thank you

## 2018-11-26 IMAGING — CR DG CHEST 2V
2 series · 2 of 2 positions shown · non-contrast
Comparison: 10/01/2015

CLINICAL DATA: Shortness of breath and cough for 2 days

EXAM:
CHEST  2 VIEW

[w chest pa]
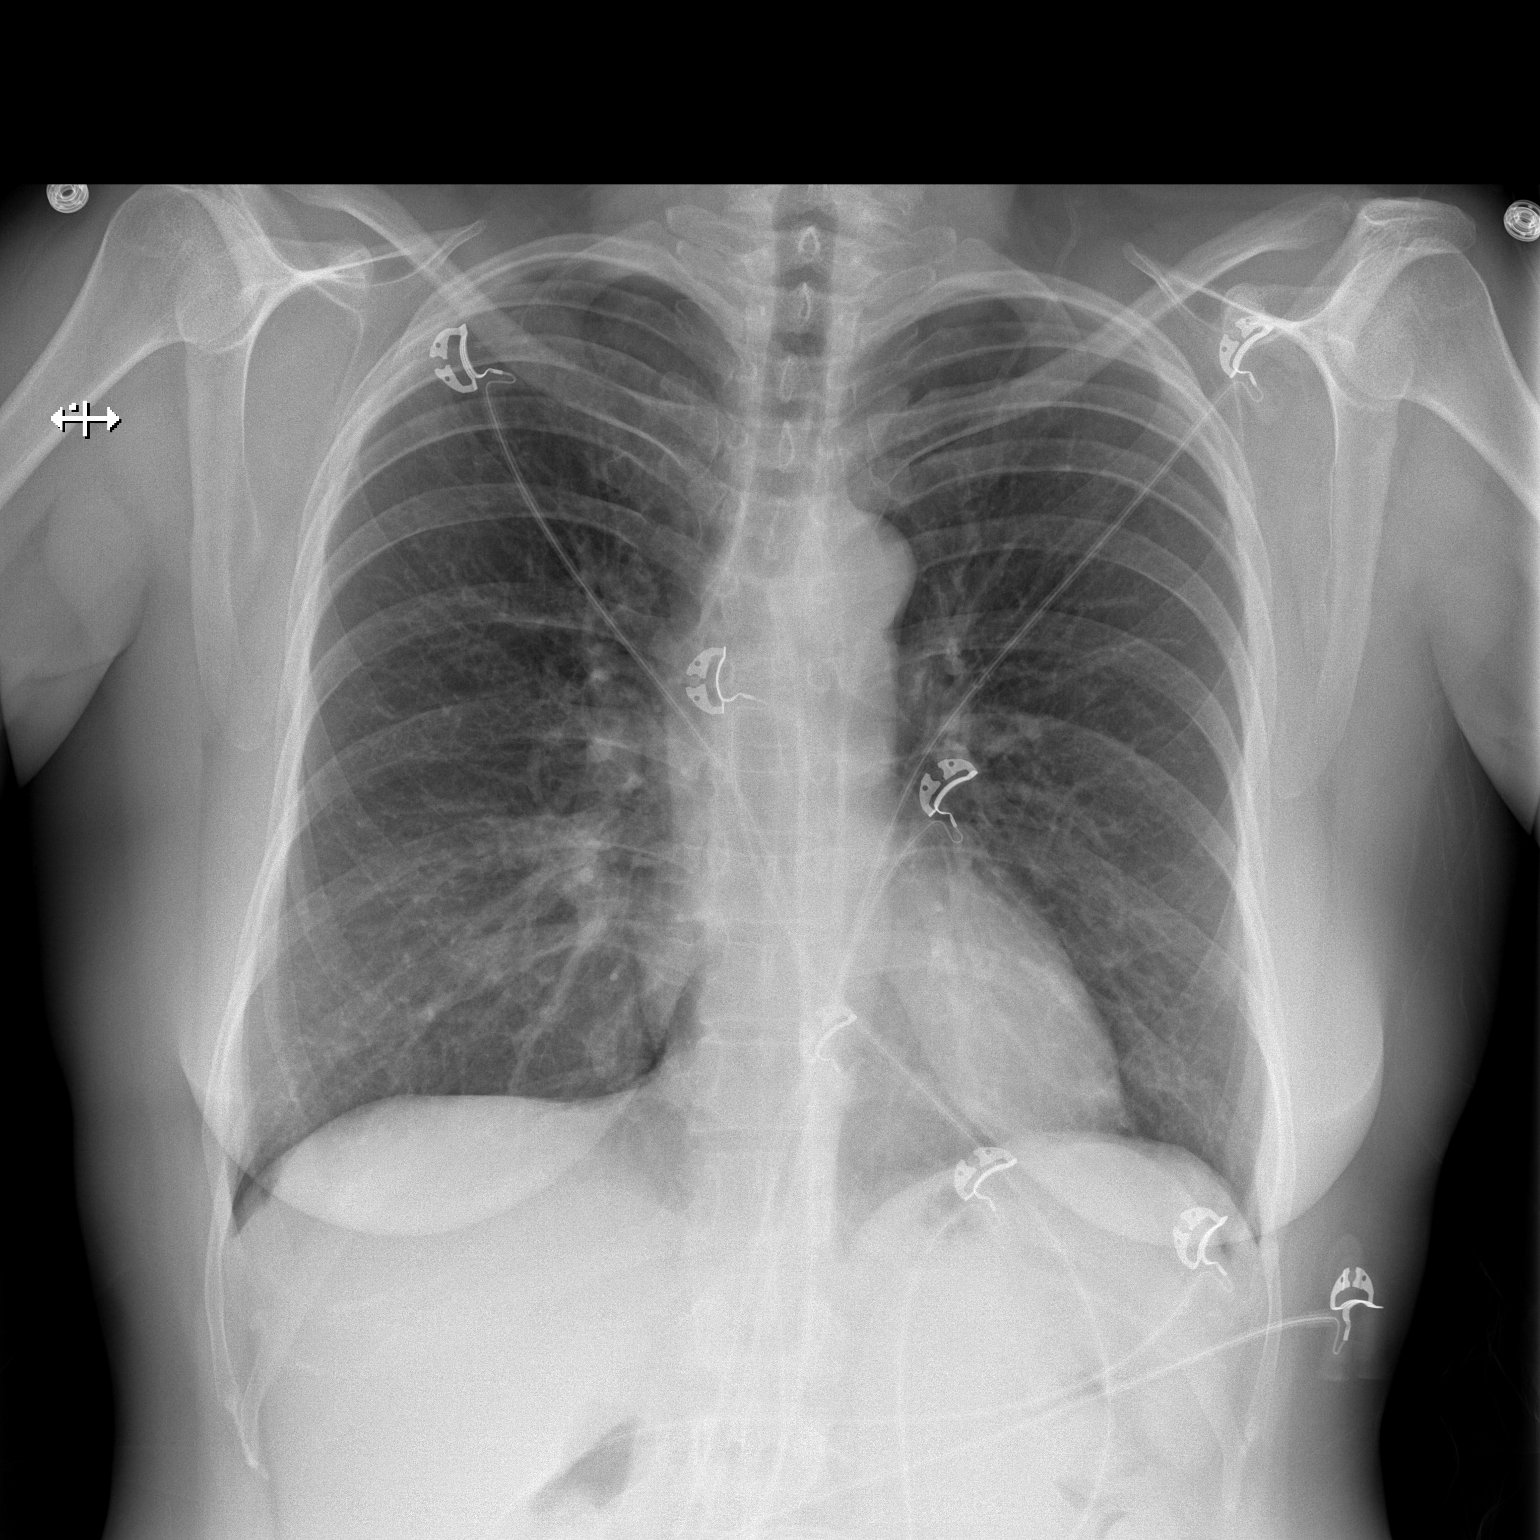

[w chest lat]
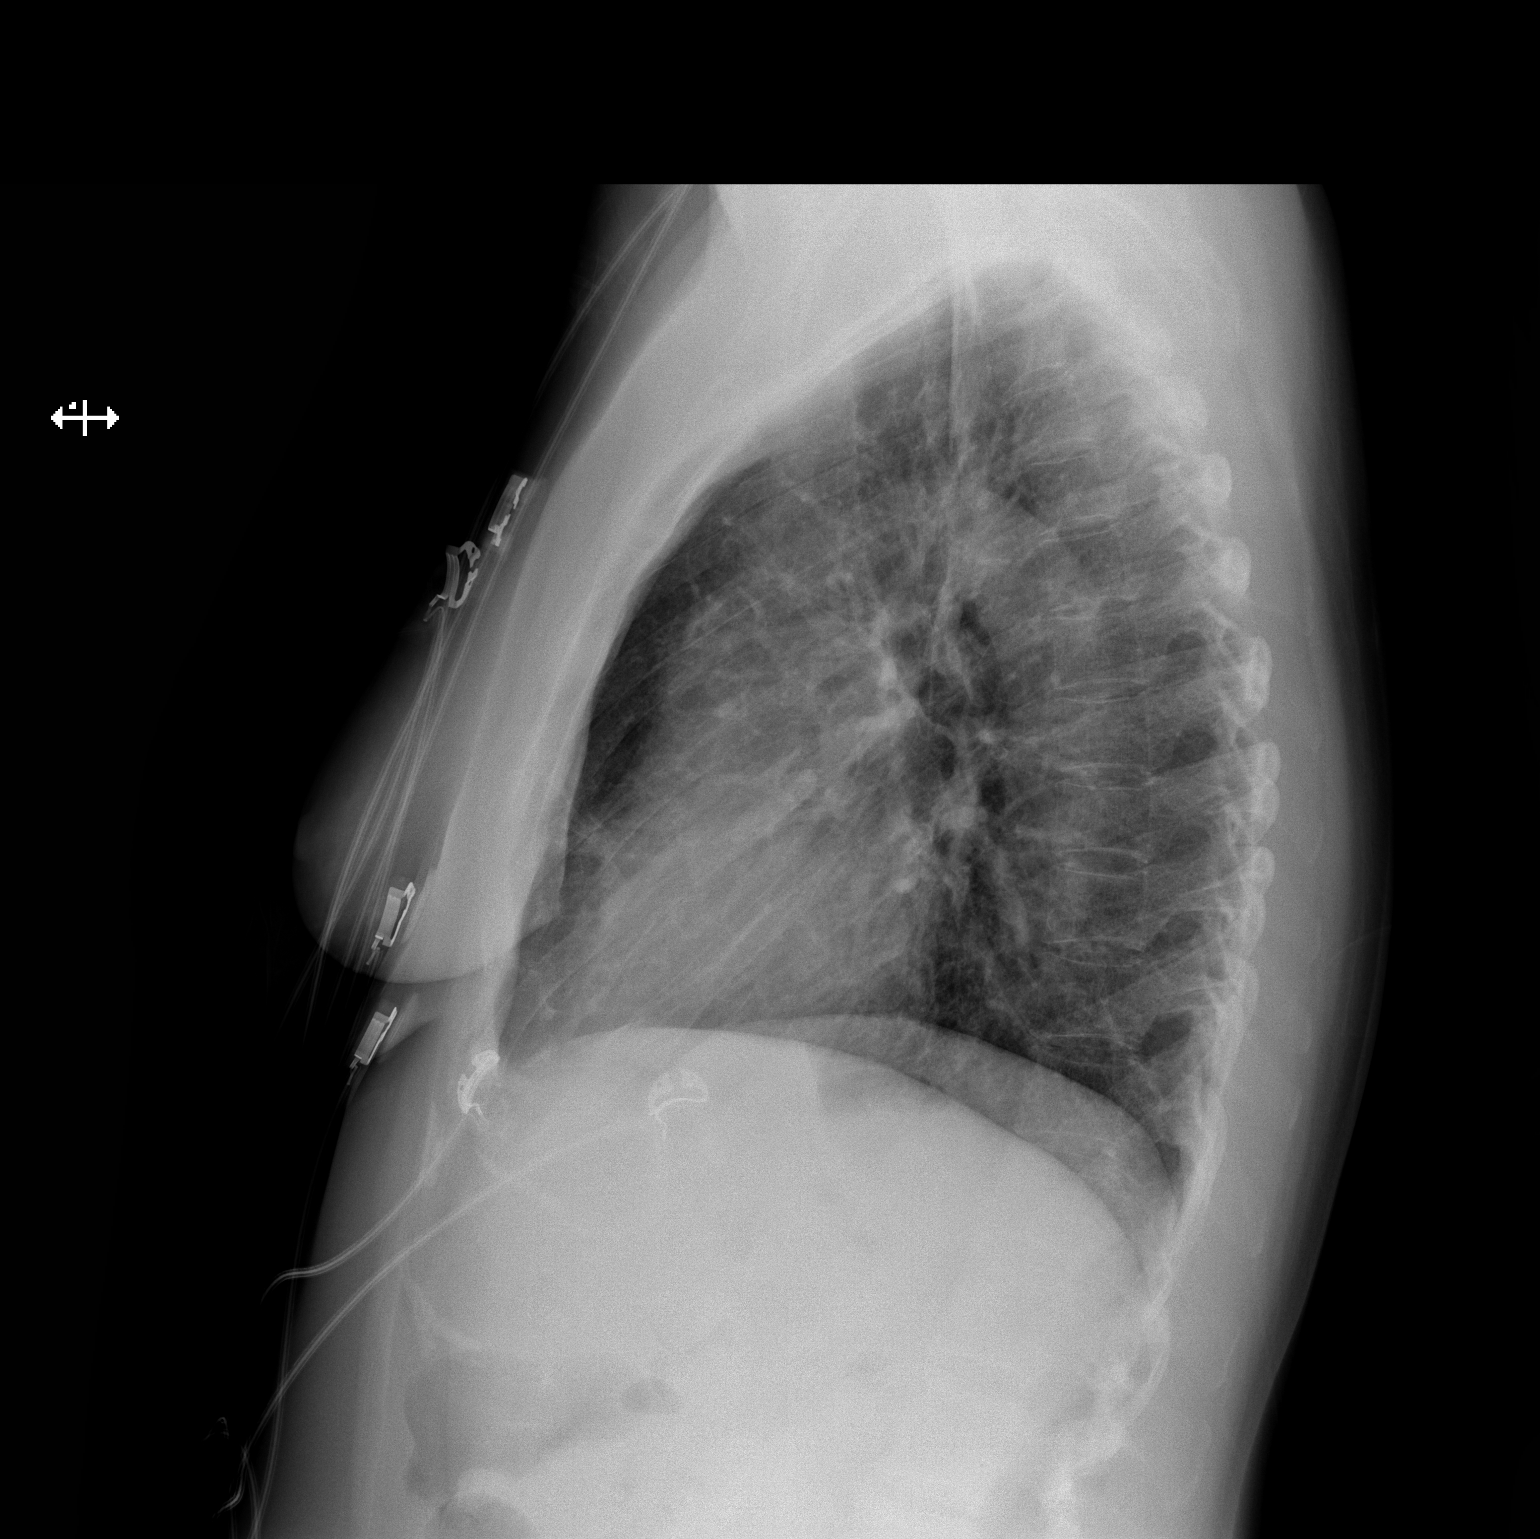

[2 of 2 positions shown; findings below may reference images not displayed]

FINDINGS: Mild interstitial prominence similar compared to previous exam. No
acute consolidation or effusion. Heart size is normal. No
pneumothorax.
IMPRESSION: No focal pulmonary infiltrate or overt pulmonary edema.

## 2019-01-14 ENCOUNTER — Ambulatory Visit: Payer: Self-pay

## 2019-01-14 NOTE — Telephone Encounter (Signed)
See note

## 2019-01-14 NOTE — Telephone Encounter (Signed)
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 The patient asks will she need to be tested. I asked about symptoms of her and her daughters, she says no one has any symptoms. I advised to monitor for symptoms for herself and I will send over to the provider for any other recommendation, she verbalized understanding.    Reason for Disposition . Health Information question, no triage required and triager able to answer question  Protocols used: INFORMATION ONLY CALL-A-AH

## 2019-01-14 NOTE — Telephone Encounter (Signed)
See message and advise

## 2019-01-15 ENCOUNTER — Encounter: Payer: Self-pay | Admitting: Physician Assistant

## 2019-01-15 ENCOUNTER — Ambulatory Visit (INDEPENDENT_AMBULATORY_CARE_PROVIDER_SITE_OTHER): Payer: 59 | Admitting: Physician Assistant

## 2019-01-15 VITALS — Ht 64.75 in | Wt 149.0 lb

## 2019-01-15 DIAGNOSIS — Z7189 Other specified counseling: Secondary | ICD-10-CM

## 2019-01-15 NOTE — Progress Notes (Signed)
TELEPHONE ENCOUNTER   Patient verbally agreed to telephone visit and is aware that copayment and coinsurance may apply. Patient was treated using telemedicine according to accepted telemedicine protocols.  Location of the patient: home Location of provider: [facility] office Names of all persons participating in the telemedicine service and role in the encounter: Inda Coke, [place], Anselmo Pickler, LPN  Subjective:   Chief Complaint  Patient presents with  . 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 She wants to know if she needs to be tested. Denies any symptoms at present and daughter's have no symptoms either. Neither her or her daughters have had any contact with the person that tested positive.  Patient Active Problem List   Diagnosis Date Noted  . Sinus tachycardia by electrocardiogram 05/08/2017  . Sinus tachycardia 07/09/2016  . Moderate persistent asthma with acute exacerbation   . Status asthmaticus 10/01/2015  . Leukocytosis 10/01/2015  . Hyperglycemia 10/01/2015  . Depression with anxiety 06/22/2012  . HTN (hypertension), accelerated  05/08/2012   Social History   Tobacco Use  . Smoking status: Former Smoker    Years: 12.00  . Smokeless tobacco: Never Used  Substance Use Topics  . Alcohol use: Yes    Comment: Socially    Current Outpatient Medications:  .  albuterol (PROVENTIL HFA;VENTOLIN HFA) 108 (90 Base) MCG/ACT inhaler, Inhale 2 puffs into the lungs every 6 (six) hours as needed for wheezing or shortness of breath., Disp: , Rfl:  .  albuterol (PROVENTIL) (2.5 MG/3ML) 0.083% nebulizer solution, USE ONE VIAL IN NEBULIZER EVERY 4 TO 6 HOURS AS NEEDED FOR COUGH OR WHEEZING, Disp: 50 vial, Rfl: 1 .  budesonide-formoterol  (SYMBICORT) 160-4.5 MCG/ACT inhaler, Inhale 2 puffs into the lungs 2 (two) times daily., Disp: 1 Inhaler, Rfl: 2 .  diltiazem (TIAZAC) 300 MG 24 hr capsule, Take 1 capsule (300 mg total) by mouth daily., Disp: 90 capsule, Rfl: 1 .  diphenhydrAMINE (BENADRYL) 25 MG tablet, Take 50 mg by mouth at bedtime., Disp: , Rfl:  .  hydrochlorothiazide (HYDRODIURIL) 25 MG tablet, Take 1 tablet (25 mg total) by mouth daily., Disp: 90 tablet, Rfl: 0 .  ibuprofen (ADVIL,MOTRIN) 200 MG tablet, Take 600 mg by mouth every 6 (six) hours as needed for headache. , Disp: , Rfl:  .  lisinopril (PRINIVIL,ZESTRIL) 20 MG tablet, Take 1 tablet (20 mg total) by mouth daily., Disp: 90 tablet, Rfl: 0 .  Multiple Vitamin (MULTIVITAMIN) tablet, Take 1 tablet by mouth daily., Disp: , Rfl:  .  omeprazole (PRILOSEC) 20 MG capsule, Take 20 mg by mouth daily. , Disp: , Rfl:  .  triamcinolone (NASACORT ALLERGY 24HR) 55 MCG/ACT AERO nasal inhaler, Place 1 spray into the nose daily., Disp: , Rfl:  .  valACYclovir (VALTREX) 1000 MG tablet, TAKE 1 TABLET BY MOUTH ONCE DAILY, Disp: 90 tablet, Rfl: 1 Allergies  Allergen Reactions  . Dilaudid [Hydromorphone Hcl] Shortness Of Breath, Swelling and Other (See Comments)    Redness in hands and thorat.  . Morphine Hives    REACTION: per pt gives her hives    Assessment & Plan:   1. Advice Given About Covid-19 Virus Infection    Possible third hand exposure to COVID-19. Currently asymptomatic. I  did not recommend self-quarantining at this time, but did review symptoms of COVID-19 and recommend vigilant self assessment of symptoms on a daily basis. Will possibly need testing if any symptoms develop.  No orders of the defined types were placed in this encounter.  No orders of the defined types were placed in this encounter.   Jarold MottoSamantha Coriana Angello, [place] 01/15/2019  Time spent with the patient: 5 minutes, spent in obtaining information about her symptoms, reviewing her previous labs, evaluations,  and treatments, counseling her about her condition (please see the discussed topics above), and developing a plan to further investigate it; she had a number of questions which I addressed.

## 2019-01-15 NOTE — Telephone Encounter (Signed)
Virtual appointment  

## 2019-01-15 NOTE — Telephone Encounter (Signed)
Spoke to pt, appt scheduled for today.

## 2019-02-05 ENCOUNTER — Other Ambulatory Visit: Payer: Self-pay | Admitting: Physician Assistant

## 2019-04-02 ENCOUNTER — Other Ambulatory Visit: Payer: Self-pay | Admitting: Physician Assistant

## 2019-04-30 ENCOUNTER — Other Ambulatory Visit: Payer: Self-pay | Admitting: Physician Assistant

## 2019-05-08 ENCOUNTER — Other Ambulatory Visit: Payer: Self-pay | Admitting: Physician Assistant

## 2019-05-16 ENCOUNTER — Encounter: Payer: Self-pay | Admitting: Allergy and Immunology

## 2019-05-21 NOTE — Telephone Encounter (Signed)
Symbicort is a tier 3 which patient must try and fail either Asmanex, Incruse Ellipta, Spiriva or Striverdi. Please advise on alternative.

## 2019-05-22 ENCOUNTER — Other Ambulatory Visit: Payer: Self-pay | Admitting: *Deleted

## 2019-05-22 MED ORDER — FLUTICASONE-SALMETEROL 250-50 MCG/DOSE IN AEPB: 1.0000 | INHALATION_SPRAY | Freq: Two times a day (BID) | RESPIRATORY_TRACT | 5 refills | Status: DC

## 2019-05-31 ENCOUNTER — Encounter: Payer: Self-pay | Admitting: Physician Assistant

## 2019-05-31 MED ORDER — DILTIAZEM HCL ER BEADS 300 MG PO CP24: 300.0000 mg | ORAL_CAPSULE | Freq: Every day | ORAL | 0 refills | Status: DC

## 2019-08-04 ENCOUNTER — Other Ambulatory Visit: Payer: Self-pay | Admitting: Physician Assistant

## 2019-08-13 ENCOUNTER — Other Ambulatory Visit: Payer: Self-pay | Admitting: Physician Assistant

## 2019-08-13 MED ORDER — HYDROCHLOROTHIAZIDE 25 MG PO TABS: 25.0000 mg | ORAL_TABLET | Freq: Every day | ORAL | 0 refills | Status: DC

## 2019-08-13 MED ORDER — LISINOPRIL 20 MG PO TABS: 20.0000 mg | ORAL_TABLET | Freq: Every day | ORAL | 0 refills | Status: DC

## 2019-08-29 ENCOUNTER — Other Ambulatory Visit: Payer: Self-pay | Admitting: Physician Assistant

## 2019-08-29 MED ORDER — VALACYCLOVIR HCL 1 G PO TABS: 1000.0000 mg | ORAL_TABLET | Freq: Every day | ORAL | 0 refills | Status: DC

## 2019-09-11 ENCOUNTER — Other Ambulatory Visit: Payer: Self-pay | Admitting: Physician Assistant

## 2019-09-11 MED ORDER — DILTIAZEM HCL ER BEADS 300 MG PO CP24: 300.0000 mg | ORAL_CAPSULE | Freq: Every day | ORAL | 0 refills | Status: DC

## 2019-09-18 ENCOUNTER — Other Ambulatory Visit: Payer: Self-pay | Admitting: Physician Assistant

## 2019-09-18 ENCOUNTER — Encounter: Payer: Self-pay | Admitting: Physician Assistant

## 2019-09-18 MED ORDER — FLUTICASONE-SALMETEROL 113-14 MCG/ACT IN AEPB: 1.0000 | INHALATION_SPRAY | Freq: Two times a day (BID) | RESPIRATORY_TRACT | 3 refills | Status: DC

## 2019-11-12 ENCOUNTER — Other Ambulatory Visit: Payer: Self-pay | Admitting: Physician Assistant

## 2019-11-14 ENCOUNTER — Encounter: Payer: Self-pay | Admitting: Physician Assistant

## 2019-11-18 MED ORDER — HYDROCHLOROTHIAZIDE 25 MG PO TABS: 25.0000 mg | ORAL_TABLET | Freq: Every day | ORAL | 0 refills | Status: DC

## 2019-11-18 MED ORDER — LISINOPRIL 20 MG PO TABS: 20.0000 mg | ORAL_TABLET | Freq: Every day | ORAL | 0 refills | Status: DC

## 2019-11-18 NOTE — Telephone Encounter (Signed)
Pt called and scheduled appt on Wed 4/21 at 2:30. Pt did not want to schedule a virtual appt. Pt requested for Lelon Mast to send in supply of medicine until appt date. Please advise.

## 2019-11-20 ENCOUNTER — Other Ambulatory Visit: Payer: Self-pay | Admitting: Physician Assistant

## 2019-11-27 ENCOUNTER — Other Ambulatory Visit: Payer: Self-pay

## 2019-11-27 ENCOUNTER — Ambulatory Visit (INDEPENDENT_AMBULATORY_CARE_PROVIDER_SITE_OTHER): Payer: 59 | Admitting: Physician Assistant

## 2019-11-27 ENCOUNTER — Encounter: Payer: Self-pay | Admitting: Physician Assistant

## 2019-11-27 VITALS — BP 132/84 | HR 123 | Temp 97.8°F | Ht 64.75 in | Wt 157.8 lb

## 2019-11-27 DIAGNOSIS — J4542 Moderate persistent asthma with status asthmaticus: Secondary | ICD-10-CM | POA: Diagnosis not present

## 2019-11-27 DIAGNOSIS — I10 Essential (primary) hypertension: Secondary | ICD-10-CM | POA: Diagnosis not present

## 2019-11-27 DIAGNOSIS — B009 Herpesviral infection, unspecified: Secondary | ICD-10-CM | POA: Diagnosis not present

## 2019-11-27 LAB — BASIC METABOLIC PANEL
BUN: 11 mg/dL (ref 6–23)
CO2: 32 mEq/L (ref 19–32)
Calcium: 9.7 mg/dL (ref 8.4–10.5)
Chloride: 97 mEq/L (ref 96–112)
Creatinine, Ser: 1.06 mg/dL (ref 0.40–1.20)
GFR: 55.6 mL/min — ABNORMAL LOW (ref >60.00)
Glucose, Bld: 101 mg/dL — ABNORMAL HIGH (ref 70–99)
Potassium: 3.8 mEq/L (ref 3.5–5.1)
Sodium: 137 mEq/L (ref 135–145)

## 2019-11-27 MED ORDER — HYDROCHLOROTHIAZIDE 25 MG PO TABS: 25.0000 mg | ORAL_TABLET | Freq: Every day | ORAL | 3 refills | Status: DC

## 2019-11-27 MED ORDER — FLUTICASONE-SALMETEROL 113-14 MCG/ACT IN AEPB: 1.0000 | INHALATION_SPRAY | Freq: Two times a day (BID) | RESPIRATORY_TRACT | 3 refills | Status: DC

## 2019-11-27 MED ORDER — LISINOPRIL 20 MG PO TABS: 20.0000 mg | ORAL_TABLET | Freq: Every day | ORAL | 3 refills | Status: DC

## 2019-11-27 MED ORDER — DILTIAZEM HCL ER COATED BEADS 300 MG PO CP24: 300.0000 mg | ORAL_CAPSULE | Freq: Every day | ORAL | 3 refills | Status: DC

## 2019-11-27 MED ORDER — VALACYCLOVIR HCL 1 G PO TABS: 1000.0000 mg | ORAL_TABLET | Freq: Every day | ORAL | 3 refills | Status: DC

## 2019-11-27 MED ORDER — SYMBICORT 160-4.5 MCG/ACT IN AERO: 2.0000 | INHALATION_SPRAY | Freq: Two times a day (BID) | RESPIRATORY_TRACT | 3 refills | Status: DC

## 2019-11-27 NOTE — Progress Notes (Signed)
Laura Clay is a 49 y.o. female is here for a follow up. She is requesting refills on HCTZ, Lisinopril, and Valtrex.  I acted as a Neurosurgeon for [employer], PA-C [employer], [place]  History of Present Illness:   Chief Complaint  Patient presents with  . Hypertension    HPI   HTN Currently taking Caria XT 300 mg, HCTZ 25 mg, and Lisinopril 20 mg. At home blood pressure readings are: not checked. Patient denies chest pain, SOB, blurred vision, dizziness, unusual headaches, lower leg swelling. Patient is compliant with medication. Denies excessive caffeine intake, stimulant usage, excessive alcohol intake, or increase in salt consumption.  BP Readings from Last 3 Encounters:  11/27/19 132/84  06/19/18 112/78  06/08/18 124/84   Moderate persistent asthma Currently prescribed Respiclick, but prefers Symbicort - this was much more expensive for her in the past. She needs refill on this. Denies any recent asthma exacerbations.   HSV Takes 1000 mg valtrex daily for suppression. Needs refill. Denies recent outbreaks or any concerns.  Health Maintenance Due  Topic Date Due  . COVID-19 Vaccine (1) Never done    Past Medical History:  Diagnosis Date  . Anxiety   . Asthma   . Depression   . GERD (gastroesophageal reflux disease)   . Hypertension   . Kidney stones      Social History   Socioeconomic History  . Marital status: Widowed    Spouse name: Not on file  . Number of children: Not on file  . Years of education: Not on file  . Highest education level: Not on file  Occupational History  . Not on file  Tobacco Use  . Smoking status: Former Smoker    Years: 12.00  . Smokeless tobacco: Never Used  Substance and Sexual Activity  . Alcohol use: Yes    Comment: Socially  . Drug use: No  . Sexual activity: Yes    Partners: Male    Birth control/protection: Surgical  Other Topics Concern  . Not on file  Social History Narrative   Widowed.    Works as an  Marine scientist estate   2 daughters, 18 and 20 - both still live at home   Quit smoking in February 2015   4-5 beers a week   She does routine rolling and walking for 45 minutes to an hour 3-4 days a week   Social Determinants of Corporate investment banker Strain:   . Difficulty of Paying Living Expenses:   Food Insecurity:   . Worried About Programme researcher, broadcasting/film/video in the Last Year:   . Barista in the Last Year:   Transportation Needs:   . Freight forwarder (Medical):   Marland Kitchen Lack of Transportation (Non-Medical):   Physical Activity:   . Days of Exercise per Week:   . Minutes of Exercise per Session:   Stress:   . Feeling of Stress :   Social Connections:   . Frequency of Communication with Friends and Family:   . Frequency of Social Gatherings with Friends and Family:   . Attends Religious Services:   . Active Member of Clubs or Organizations:   . Attends Banker Meetings:   Marland Kitchen Marital Status:   Intimate Partner Violence:   . Fear of Current or Ex-Partner:   . Emotionally Abused:   Marland Kitchen Physically Abused:   . Sexually Abused:  Past Surgical History:  Procedure Laterality Date  . ABDOMINAL HYSTERECTOMY  2000   Abnormal PAPs  . BUNIONECTOMY Left   . OVARIAN CYST REMOVAL      Family History  Problem Relation Age of Onset  . Breast cancer Maternal Grandmother   . Colon cancer Maternal Aunt   . Colon polyps Cousin   . Colon polyps Maternal Aunt   . Diabetes Father   . Kidney disease Father   . Congestive Heart Failure Father 22  . Kidney cancer Maternal Uncle   . Hypertension Mother   . Valvular heart disease Mother        ? Mitral Regurg --> s/p Operative repair.    PMHx, SurgHx, SocialHx, FamHx, Medications, and Allergies were reviewed in the Visit Navigator and updated as appropriate.   Patient Active Problem List   Diagnosis Date Noted  . Sinus tachycardia by electrocardiogram 05/08/2017   . Sinus tachycardia 07/09/2016  . Moderate persistent asthma with acute exacerbation   . Status asthmaticus 10/01/2015  . Leukocytosis 10/01/2015  . Hyperglycemia 10/01/2015  . Depression with anxiety 06/22/2012  . HTN (hypertension), accelerated  05/08/2012    Social History   Tobacco Use  . Smoking status: Former Smoker    Years: 12.00  . Smokeless tobacco: Never Used  Substance Use Topics  . Alcohol use: Yes    Comment: Socially  . Drug use: No    Current Medications and Allergies:    Current Outpatient Medications:  .  albuterol (PROVENTIL HFA;VENTOLIN HFA) 108 (90 Base) MCG/ACT inhaler, Inhale 2 puffs into the lungs every 6 (six) hours as needed for wheezing or shortness of breath., Disp: , Rfl:  .  albuterol (PROVENTIL) (2.5 MG/3ML) 0.083% nebulizer solution, USE ONE VIAL IN NEBULIZER EVERY 4 TO 6 HOURS AS NEEDED FOR COUGH OR WHEEZING, Disp: 50 vial, Rfl: 1 .  diltiazem (CARTIA XT) 300 MG 24 hr capsule, Take 1 capsule (300 mg total) by mouth daily., Disp: 90 capsule, Rfl: 3 .  diphenhydrAMINE (BENADRYL) 25 MG tablet, Take 50 mg by mouth at bedtime., Disp: , Rfl:  .  Fluticasone-Salmeterol (AIRDUO RESPICLICK 564/33) 295-18 MCG/ACT AEPB, Inhale 1 puff into the lungs 2 (two) times daily., Disp: 1 each, Rfl: 3 .  hydrochlorothiazide (HYDRODIURIL) 25 MG tablet, Take 1 tablet (25 mg total) by mouth daily., Disp: 90 tablet, Rfl: 3 .  ibuprofen (ADVIL,MOTRIN) 200 MG tablet, Take 600 mg by mouth every 6 (six) hours as needed for headache. , Disp: , Rfl:  .  lisinopril (ZESTRIL) 20 MG tablet, Take 1 tablet (20 mg total) by mouth daily., Disp: 90 tablet, Rfl: 3 .  Multiple Vitamin (MULTIVITAMIN) tablet, Take 1 tablet by mouth daily., Disp: , Rfl:  .  omeprazole (PRILOSEC) 20 MG capsule, Take 20 mg by mouth daily. , Disp: , Rfl:  .  SYMBICORT 160-4.5 MCG/ACT inhaler, Inhale 2 puffs into the lungs in the morning and at bedtime., Disp: 11 g, Rfl: 3 .  triamcinolone (NASACORT ALLERGY  24HR) 55 MCG/ACT AERO nasal inhaler, Place 1 spray into the nose daily., Disp: , Rfl:  .  valACYclovir (VALTREX) 1000 MG tablet, Take 1 tablet (1,000 mg total) by mouth daily., Disp: 90 tablet, Rfl: 3   Allergies  Allergen Reactions  . Dilaudid [Hydromorphone Hcl] Shortness Of Breath, Swelling and Other (See Comments)    Redness in hands and thorat.  . Morphine Hives    REACTION: per pt gives her hives    Review of Systems  ROS  Negative unless otherwise specified per HPI.  Vitals:   Vitals:   11/27/19 1428  BP: 132/84  Pulse: (!) 123  Temp: 97.8 F (36.6 C)  TempSrc: Temporal  SpO2: 98%  Weight: 157 lb 12.8 oz (71.6 kg)  Height: 5' 4.75" (1.645 m)     Body mass index is 26.46 kg/m.   Physical Exam:    Physical Exam Vitals and nursing note reviewed.  Constitutional:      General: She is not in acute distress.    Appearance: She is well-developed. She is not ill-appearing or toxic-appearing.  Cardiovascular:     Rate and Rhythm: Regular rhythm. Tachycardia present.     Pulses: Normal pulses.     Heart sounds: Normal heart sounds, S1 normal and S2 normal.     Comments: No LE edema Pulmonary:     Effort: Pulmonary effort is normal.     Breath sounds: Normal breath sounds.  Skin:    General: Skin is warm and dry.  Neurological:     Mental Status: She is alert.     GCS: GCS eye subscore is 4. GCS verbal subscore is 5. GCS motor subscore is 6.  Psychiatric:        Speech: Speech normal.        Behavior: Behavior normal. Behavior is cooperative.      Assessment and Plan:    Kameryn was seen today for hypertension.  Diagnoses and all orders for this visit:  Essential hypertension Controlled in office. Refill diltiazem 300 mg daily, HCTZ 25 mg daily and lisinopril 20 mg daily. Update BMP today. Follow-up in 1 year, sooner if concerns.  -     Basic metabolic panel  Moderate persistent asthma with status asthmaticus Controlled. Did provide coupon for  symbicort in case she is able to get better coverage with this than the respiclick. Follow-up if any concerns.  HSV infection Well controlled with current regimen. Refill medication and follow-up in 1 year, sooner if concerns.  Other orders -     diltiazem (CARTIA XT) 300 MG 24 hr capsule; Take 1 capsule (300 mg total) by mouth daily. -     Fluticasone-Salmeterol (AIRDUO RESPICLICK 113/14) 113-14 MCG/ACT AEPB; Inhale 1 puff into the lungs 2 (two) times daily. -     hydrochlorothiazide (HYDRODIURIL) 25 MG tablet; Take 1 tablet (25 mg total) by mouth daily. -     lisinopril (ZESTRIL) 20 MG tablet; Take 1 tablet (20 mg total) by mouth daily. -     SYMBICORT 160-4.5 MCG/ACT inhaler; Inhale 2 puffs into the lungs in the morning and at bedtime. -     valACYclovir (VALTREX) 1000 MG tablet; Take 1 tablet (1,000 mg total) by mouth daily.   Reviewed expectations re: course of current medical issues. Discussed self-management of symptoms. Outlined signs and symptoms indicating need for more acute intervention. Patient verbalized understanding and all questions were answered. See orders for this visit as documented in the electronic medical record. Patient received an After Visit Summary.  CMA or LPN served as scribe during this visit. History, Physical, and Plan performed by medical provider. The above documentation has been reviewed and is accurate and complete.  Jarold Motto, PA-C [place], [place] 11/27/2019  Follow-up: No follow-ups on file.

## 2019-11-27 NOTE — Patient Instructions (Signed)
It was great to see you!  Refills have been sent in for you.  Let's follow-up in 1 year, sooner if you have concerns.  Due to recent changes in healthcare laws, you may see the results of your imaging and laboratory studies on MyChart before your provider has had a chance to review them.  We understand that in some cases there may be results that are confusing or concerning to you. Not all laboratory results come back in the same time frame and the provider may be waiting for multiple results in order to interpret others.  Please give [place] 48 hours in order for your provider to thoroughly review all the results before contacting the office for clarification of your results.   Take care,  Jarold Motto PA-C

## 2019-11-29 ENCOUNTER — Other Ambulatory Visit: Payer: Self-pay | Admitting: Physician Assistant

## 2019-11-29 DIAGNOSIS — R944 Abnormal results of kidney function studies: Secondary | ICD-10-CM

## 2020-04-23 ENCOUNTER — Encounter: Payer: Self-pay | Admitting: Physician Assistant

## 2020-05-15 ENCOUNTER — Other Ambulatory Visit: Payer: Self-pay | Admitting: *Deleted

## 2020-05-15 MED ORDER — LISINOPRIL 10 MG PO TABS: 20.0000 mg | ORAL_TABLET | Freq: Every day | ORAL | 1 refills | Status: DC

## 2020-06-24 ENCOUNTER — Other Ambulatory Visit: Payer: Self-pay | Admitting: Physician Assistant

## 2020-08-18 ENCOUNTER — Other Ambulatory Visit: Payer: 59

## 2020-08-18 DIAGNOSIS — Z20822 Contact with and (suspected) exposure to covid-19: Secondary | ICD-10-CM

## 2020-08-20 LAB — SARS-COV-2, NAA 2 DAY TAT

## 2020-08-20 LAB — NOVEL CORONAVIRUS, NAA: SARS-CoV-2, NAA: NOT DETECTED

## 2020-11-18 ENCOUNTER — Other Ambulatory Visit: Payer: Self-pay | Admitting: Physician Assistant

## 2020-11-25 ENCOUNTER — Other Ambulatory Visit: Payer: Self-pay | Admitting: Physician Assistant

## 2020-12-09 ENCOUNTER — Other Ambulatory Visit: Payer: Self-pay | Admitting: Physician Assistant

## 2021-01-12 ENCOUNTER — Other Ambulatory Visit: Payer: Self-pay | Admitting: Physician Assistant

## 2021-01-22 ENCOUNTER — Other Ambulatory Visit: Payer: Self-pay | Admitting: Physician Assistant

## 2021-02-10 ENCOUNTER — Other Ambulatory Visit: Payer: Self-pay | Admitting: Family Medicine

## 2021-02-24 ENCOUNTER — Other Ambulatory Visit: Payer: Self-pay | Admitting: Family Medicine

## 2021-03-09 ENCOUNTER — Other Ambulatory Visit: Payer: Self-pay | Admitting: Family Medicine

## 2021-03-10 ENCOUNTER — Other Ambulatory Visit: Payer: Self-pay | Admitting: Family Medicine

## 2021-03-12 ENCOUNTER — Other Ambulatory Visit: Payer: Self-pay | Admitting: Family Medicine

## 2021-03-15 ENCOUNTER — Encounter: Payer: Self-pay | Admitting: Physician Assistant

## 2021-03-15 ENCOUNTER — Other Ambulatory Visit: Payer: Self-pay

## 2021-03-16 ENCOUNTER — Other Ambulatory Visit: Payer: Self-pay | Admitting: Family Medicine

## 2021-03-26 ENCOUNTER — Other Ambulatory Visit: Payer: Self-pay

## 2021-03-26 ENCOUNTER — Encounter: Payer: Self-pay | Admitting: Physician Assistant

## 2021-03-26 ENCOUNTER — Ambulatory Visit (INDEPENDENT_AMBULATORY_CARE_PROVIDER_SITE_OTHER): Payer: 59 | Admitting: Physician Assistant

## 2021-03-26 VITALS — BP 111/78 | HR 94 | Temp 98.6°F | Ht 64.75 in | Wt 162.6 lb

## 2021-03-26 DIAGNOSIS — Z0001 Encounter for general adult medical examination with abnormal findings: Secondary | ICD-10-CM | POA: Diagnosis not present

## 2021-03-26 DIAGNOSIS — I10 Essential (primary) hypertension: Secondary | ICD-10-CM

## 2021-03-26 DIAGNOSIS — L811 Chloasma: Secondary | ICD-10-CM

## 2021-03-26 DIAGNOSIS — B009 Herpesviral infection, unspecified: Secondary | ICD-10-CM | POA: Diagnosis not present

## 2021-03-26 DIAGNOSIS — Z136 Encounter for screening for cardiovascular disorders: Secondary | ICD-10-CM

## 2021-03-26 DIAGNOSIS — Z13220 Encounter for screening for lipoid disorders: Secondary | ICD-10-CM

## 2021-03-26 MED ORDER — LISINOPRIL 20 MG PO TABS: ORAL_TABLET | ORAL | 3 refills | Status: DC

## 2021-03-26 MED ORDER — DILTIAZEM HCL ER COATED BEADS 300 MG PO CP24: ORAL_CAPSULE | ORAL | 3 refills | Status: DC

## 2021-03-26 MED ORDER — AZELEX 20 % EX CREA: TOPICAL_CREAM | Freq: Two times a day (BID) | CUTANEOUS | 0 refills | Status: DC

## 2021-03-26 MED ORDER — HYDROCHLOROTHIAZIDE 25 MG PO TABS: ORAL_TABLET | ORAL | 3 refills | Status: DC

## 2021-03-26 MED ORDER — VALACYCLOVIR HCL 1 G PO TABS: 1000.0000 mg | ORAL_TABLET | Freq: Every day | ORAL | 3 refills | Status: DC

## 2021-03-26 NOTE — Patient Instructions (Signed)
It was great to see you! ? ?Please go to the lab for blood work.  ? ?Our office will call you with your results unless you have chosen to receive results via MyChart. ? ?If your blood work is normal we will follow-up each year for physicals and as scheduled for chronic medical problems. ? ?If anything is abnormal we will treat accordingly and get you in for a follow-up. ? ?Take care, ? ?Laura Clay ?  ? ? ?

## 2021-03-26 NOTE — Progress Notes (Signed)
Subjective:    Laura Clay is a 50 y.o. female and is here for a comprehensive physical exam.   HPI  Health Maintenance Due  Topic Date Due   COVID-19 Vaccine (1) Never done   Hepatitis C Screening  Never done   COLONOSCOPY (Pts 45-59yrs Insurance coverage will need to be confirmed)  Never done   INFLUENZA VACCINE  03/08/2021    Acute Concerns: Melasma --patient is wondering if she can have a prescription to help with darkening area of skin on her face, has not tried anything over-the-counter  Chronic Issues: HSV -patient takes Valtrex 1000 mg daily needs refill, denies any outbreaks while on this medication HTN -- Currently taking lisinopril 20 mg daily, diltiazem 300 mg daily, hydrochlorothiazide 25 mg daily mg. At home blood pressure readings are: Within normal limits. Patient denies chest pain, SOB, blurred vision, dizziness, unusual headaches, lower leg swelling. Patient is compliant with medication. Denies excessive caffeine intake, stimulant usage, excessive alcohol intake, or increase in salt consumption.  BP Readings from Last 3 Encounters:  03/26/21 111/78  11/27/19 132/84  06/19/18 112/78   Health Maintenance: Immunizations --has not had COVID-vaccine Colonoscopy --refused Mammogram --refused PAP -- n/a Diet --tries to eat well-balanced diet Exercise --walking regularly Weight -- Weight: 162 lb 9.6 oz (73.8 kg)  Weight history: Wt Readings from Last 10 Encounters:  03/26/21 162 lb 9.6 oz (73.8 kg)  11/27/19 157 lb 12.8 oz (71.6 kg)  01/15/19 149 lb (67.6 kg)  06/19/18 153 lb 12.8 oz (69.8 kg)  06/08/18 159 lb 3.2 oz (72.2 kg)  03/16/18 161 lb (73 kg)  06/14/17 161 lb 12.8 oz (73.4 kg)  05/08/17 161 lb (73 kg)  04/11/17 159 lb 8 oz (72.3 kg)  12/09/16 161 lb (73 kg)   Body mass index is 27.27 kg/m. No LMP recorded (lmp unknown). Patient has had a hysterectomy. Alcohol use:  reports current alcohol use. Tobacco use:  Tobacco Use: Medium Risk    Smoking Tobacco Use: Former   Smokeless Tobacco Use: Never     Depression screen PHQ 2/9 12/09/2016  Decreased Interest 0  Down, Depressed, Hopeless 0  PHQ - 2 Score 0  Altered sleeping 0  Tired, decreased energy 0  Change in appetite 0  Feeling bad or failure about yourself  0  Trouble concentrating 0  Moving slowly or fidgety/restless 0  Suicidal thoughts 0  PHQ-9 Score 0     Other providers/specialists: Patient Care Team: Jarold Motto, [place] as PCP - General (Physician Assistant) Marykay Lex, MD as PCP - Cardiology (Cardiology)    PMHx, SurgHx, SocialHx, Medications, and Allergies were reviewed in the Visit Navigator and updated as appropriate.   Past Medical History:  Diagnosis Date   Anxiety    Asthma    Depression    GERD (gastroesophageal reflux disease)    Hypertension    Kidney stones      Past Surgical History:  Procedure Laterality Date   ABDOMINAL HYSTERECTOMY  2000   Abnormal PAPs   BUNIONECTOMY Left    OVARIAN CYST REMOVAL       Family History  Problem Relation Age of Onset   Breast cancer Maternal Grandmother    Colon cancer Maternal Aunt    Colon polyps Cousin    Colon polyps Maternal Aunt    Diabetes Father    Kidney disease Father    Congestive Heart Failure Father 79   Kidney cancer Maternal Uncle    Hypertension Mother  Valvular heart disease Mother        ? Mitral Regurg --> s/p Operative repair.    Social History   Tobacco Use   Smoking status: Former    Years: 12.00    Types: Cigarettes   Smokeless tobacco: Never  Substance Use Topics   Alcohol use: Yes    Comment: Socially   Drug use: No    Review of Systems:   Review of Systems  Constitutional:  Negative for chills, fever, malaise/fatigue and weight loss.  HENT:  Negative for hearing loss, sinus pain and sore throat.   Respiratory:  Negative for cough and hemoptysis.   Cardiovascular:  Negative for chest pain, palpitations, leg swelling and PND.   Gastrointestinal:  Negative for abdominal pain, constipation, diarrhea, heartburn, nausea and vomiting.  Genitourinary:  Negative for dysuria, frequency and urgency.  Musculoskeletal:  Negative for back pain, myalgias and neck pain.  Skin:  Negative for itching and rash.  Neurological:  Negative for dizziness, tingling, seizures and headaches.  Endo/Heme/Allergies:  Negative for polydipsia.  Psychiatric/Behavioral:  Negative for depression. The patient is not nervous/anxious.    Objective:   BP 111/78   Pulse 94   Temp 98.6 F (37 C) (Temporal)   Ht 5' 4.75" (1.645 m)   Wt 162 lb 9.6 oz (73.8 kg)   LMP  (LMP Unknown)   SpO2 99%   BMI 27.27 kg/m  Body mass index is 27.27 kg/m.   General Appearance:    Alert, cooperative, no distress, appears stated age  Head:    Normocephalic, without obvious abnormality, atraumatic  Eyes:    PERRL, conjunctiva/corneas clear, EOM's intact, fundi    benign, both eyes  Ears:    Normal TM's and external ear canals, both ears  Nose:   Nares normal, septum midline, mucosa normal, no drainage    or sinus tenderness  Throat:   Lips, mucosa, and tongue normal; teeth and gums normal  Neck:   Supple, symmetrical, trachea midline, no adenopathy;    thyroid:  no enlargement/tenderness/nodules; no carotid   bruit or JVD  Back:     Symmetric, no curvature, ROM normal, no CVA tenderness  Lungs:     Clear to auscultation bilaterally, respirations unlabored  Chest Wall:    No tenderness or deformity   Heart:    Regular rate and rhythm, S1 and S2 normal, no murmur, rub or gallop  Breast Exam:  Deferred  Abdomen:     Soft, non-tender, bowel sounds active all four quadrants,    no masses, no organomegaly  Genitalia:  Deferred  Extremities:   Extremities normal, atraumatic, no cyanosis or edema  Pulses:   2+ and symmetric all extremities  Skin:   Skin turgor normal, no rashes or lesions Hyperpigmented patches on bilateral cheeks and temples  Lymph nodes:    Cervical, supraclavicular, and axillary nodes normal  Neurologic:   CNII-XII intact, normal strength, sensation and reflexes    throughout    Assessment/Plan:   Nyx was seen today for annual exam.  Diagnoses and all orders for this visit:  Encounter for general adult medical examination with abnormal findings Today patient counseled on age appropriate routine health concerns for screening and prevention, each reviewed and up to date or declined. Immunizations reviewed and up to date or declined. Labs ordered and reviewed. Risk factors for depression reviewed and negative. Hearing function and visual acuity are intact. ADLs screened and addressed as needed. Functional ability and level of safety reviewed and  appropriate. Education, counseling and referrals performed based on assessed risks today. Patient provided with a copy of personalized plan for preventive services.  Essential hypertension Well-controlled Refill the following scripts, and follow-up as scheduled -     lisinopril (ZESTRIL) 20 MG tablet; TAKE 1 TABLET BY MOUTH ONCE DAILY -     hydrochlorothiazide (HYDRODIURIL) 25 MG tablet; TAKE 1 TABLET BY MOUTH ONCE DAILY -     diltiazem (CARDIZEM CD) 300 MG 24 hr capsule; TAKE 1 CAPSULE BY MOUTH ONCE DAILY -     CBC with Differential/Platelet; Future -     Comprehensive metabolic panel; Future -     Comprehensive metabolic panel -     CBC with Differential/Platelet  HSV infection Well-controlled, refill 1000 mg of Valtrex daily.  Melasma Will trial azelaic acid, if lack of improvement will need dermatology referral. -     azelaic acid (AZELEX) 20 % cream; Apply topically 2 (two) times daily. After skin is thoroughly washed and patted dry, gently but thoroughly massage a thin film of azelaic acid cream into the affected area twice daily, in the morning and evening.  Encounter for lipid screening for cardiovascular disease -     Lipid panel; Future -     Lipid panel  Other  orders  -     valACYclovir (VALTREX) 1000 MG tablet; Take 1 tablet (1,000 mg total) by mouth daily.    Patient Counseling: [x]    Nutrition: Stressed importance of moderation in sodium/caffeine intake, saturated fat and cholesterol, caloric balance, sufficient intake of fresh fruits, vegetables, fiber, calcium, iron, and 1 mg of folate supplement per day (for females capable of pregnancy).  [x]    Stressed the importance of regular exercise.   [x]    Substance Abuse: Discussed cessation/primary prevention of tobacco, alcohol, or other drug use; driving or other dangerous activities under the influence; availability of treatment for abuse.   [x]    Injury prevention: Discussed safety belts, safety helmets, smoke detector, smoking near bedding or upholstery.   [x]    Sexuality: Discussed sexually transmitted diseases, partner selection, use of condoms, avoidance of unintended pregnancy  and contraceptive alternatives.  [x]    Dental health: Discussed importance of regular tooth brushing, flossing, and dental visits.  [x]    Health maintenance and immunizations reviewed. Please refer to Health maintenance section.    , PA-C [place] [facility]

## 2021-03-27 LAB — CBC WITH DIFFERENTIAL/PLATELET
Absolute Monocytes: 707 cells/uL (ref 200–950)
Basophils Absolute: 41 cells/uL (ref 0–200)
Basophils Relative: 0.6 %
Eosinophils Absolute: 177 cells/uL (ref 15–500)
Eosinophils Relative: 2.6 %
HCT: 42.1 % (ref 35.0–45.0)
Hemoglobin: 14.5 g/dL (ref 11.7–15.5)
Lymphs Abs: 2740 cells/uL (ref 850–3900)
MCH: 31.9 pg (ref 27.0–33.0)
MCHC: 34.4 g/dL (ref 32.0–36.0)
MCV: 92.7 fL (ref 80.0–100.0)
MPV: 8.7 fL (ref 7.5–12.5)
Monocytes Relative: 10.4 %
Neutro Abs: 3135 cells/uL (ref 1500–7800)
Neutrophils Relative %: 46.1 %
Platelets: 300 10*3/uL (ref 140–400)
RBC: 4.54 10*6/uL (ref 3.80–5.10)
RDW: 13.4 % (ref 11.0–15.0)
Total Lymphocyte: 40.3 %
WBC: 6.8 10*3/uL (ref 3.8–10.8)

## 2021-03-27 LAB — COMPREHENSIVE METABOLIC PANEL
AG Ratio: 1.5 (calc) (ref 1.0–2.5)
ALT: 42 U/L — ABNORMAL HIGH (ref 6–29)
AST: 28 U/L (ref 10–35)
Albumin: 4.8 g/dL (ref 3.6–5.1)
Alkaline phosphatase (APISO): 72 U/L (ref 31–125)
BUN: 17 mg/dL (ref 7–25)
CO2: 30 mmol/L (ref 20–32)
Calcium: 10.6 mg/dL — ABNORMAL HIGH (ref 8.6–10.2)
Chloride: 99 mmol/L (ref 98–110)
Creat: 0.96 mg/dL (ref 0.50–0.99)
Globulin: 3.1 g/dL (calc) (ref 1.9–3.7)
Glucose, Bld: 85 mg/dL (ref 65–99)
Potassium: 4.6 mmol/L (ref 3.5–5.3)
Sodium: 138 mmol/L (ref 135–146)
Total Bilirubin: 0.4 mg/dL (ref 0.2–1.2)
Total Protein: 7.9 g/dL (ref 6.1–8.1)

## 2021-03-27 LAB — LIPID PANEL
Cholesterol: 251 mg/dL — ABNORMAL HIGH (ref <200)
HDL: 79 mg/dL (ref >=50)
LDL Cholesterol (Calc): 150 mg/dL (calc) — ABNORMAL HIGH
Non-HDL Cholesterol (Calc): 172 mg/dL (calc) — ABNORMAL HIGH (ref <130)
Total CHOL/HDL Ratio: 3.2 (calc) (ref <5.0)
Triglycerides: 108 mg/dL (ref <150)

## 2021-03-28 ENCOUNTER — Other Ambulatory Visit: Payer: Self-pay | Admitting: Physician Assistant

## 2021-03-28 DIAGNOSIS — R7401 Elevation of levels of liver transaminase levels: Secondary | ICD-10-CM

## 2021-03-29 ENCOUNTER — Telehealth: Payer: Self-pay | Admitting: *Deleted

## 2021-03-29 NOTE — Telephone Encounter (Signed)
Left message on voicemail to call office. Azelex 20% cream is not covered by insurance.

## 2021-03-29 NOTE — Telephone Encounter (Signed)
Patient returned call

## 2021-03-29 NOTE — Telephone Encounter (Signed)
Spoke to pt told her Azelex cream Lelon Mast ordered is not covered by insurance. Pt verbalized understanding and said she just checked with the pharmacy and they told her too and would be over $500.00 out of pocket. Pt said she is fine. Told her okay.

## 2021-04-28 ENCOUNTER — Other Ambulatory Visit: Payer: Self-pay | Admitting: Physician Assistant

## 2021-10-20 ENCOUNTER — Other Ambulatory Visit: Payer: Self-pay | Admitting: Physician Assistant

## 2021-12-14 ENCOUNTER — Other Ambulatory Visit: Payer: Self-pay | Admitting: Physician Assistant

## 2022-02-16 ENCOUNTER — Other Ambulatory Visit: Payer: Self-pay | Admitting: Physician Assistant

## 2022-03-18 ENCOUNTER — Other Ambulatory Visit: Payer: Self-pay | Admitting: Physician Assistant

## 2022-03-23 ENCOUNTER — Other Ambulatory Visit: Payer: Self-pay | Admitting: Physician Assistant

## 2022-04-01 ENCOUNTER — Encounter: Payer: 59 | Admitting: Physician Assistant

## 2022-04-08 ENCOUNTER — Encounter: Payer: 59 | Admitting: Physician Assistant

## 2022-04-15 ENCOUNTER — Ambulatory Visit (INDEPENDENT_AMBULATORY_CARE_PROVIDER_SITE_OTHER): Payer: 59 | Admitting: Physician Assistant

## 2022-04-15 ENCOUNTER — Encounter: Payer: Self-pay | Admitting: Physician Assistant

## 2022-04-15 VITALS — BP 122/80 | HR 95 | Temp 98.1°F | Ht 64.75 in | Wt 168.6 lb

## 2022-04-15 DIAGNOSIS — Z0000 Encounter for general adult medical examination without abnormal findings: Secondary | ICD-10-CM | POA: Diagnosis not present

## 2022-04-15 DIAGNOSIS — Z136 Encounter for screening for cardiovascular disorders: Secondary | ICD-10-CM

## 2022-04-15 DIAGNOSIS — J4541 Moderate persistent asthma with (acute) exacerbation: Secondary | ICD-10-CM

## 2022-04-15 DIAGNOSIS — Z1211 Encounter for screening for malignant neoplasm of colon: Secondary | ICD-10-CM | POA: Diagnosis not present

## 2022-04-15 DIAGNOSIS — Z13220 Encounter for screening for lipoid disorders: Secondary | ICD-10-CM

## 2022-04-15 DIAGNOSIS — I10 Essential (primary) hypertension: Secondary | ICD-10-CM | POA: Diagnosis not present

## 2022-04-15 LAB — COMPREHENSIVE METABOLIC PANEL
ALT: 36 U/L — ABNORMAL HIGH (ref 0–35)
AST: 23 U/L (ref 0–37)
Albumin: 4.2 g/dL (ref 3.5–5.2)
Alkaline Phosphatase: 68 U/L (ref 39–117)
BUN: 10 mg/dL (ref 6–23)
CO2: 27 mEq/L (ref 19–32)
Calcium: 9.9 mg/dL (ref 8.4–10.5)
Chloride: 105 mEq/L (ref 96–112)
Creatinine, Ser: 1.05 mg/dL (ref 0.40–1.20)
GFR: 62.52 mL/min (ref >60.00)
Glucose, Bld: 90 mg/dL (ref 70–99)
Potassium: 4.7 mEq/L (ref 3.5–5.1)
Sodium: 141 mEq/L (ref 135–145)
Total Bilirubin: 0.4 mg/dL (ref 0.2–1.2)
Total Protein: 7.6 g/dL (ref 6.0–8.3)

## 2022-04-15 LAB — CBC WITH DIFFERENTIAL/PLATELET
Basophils Absolute: 0 10*3/uL (ref 0.0–0.1)
Basophils Relative: 0.5 % (ref 0.0–3.0)
Eosinophils Absolute: 0.2 10*3/uL (ref 0.0–0.7)
Eosinophils Relative: 2.7 % (ref 0.0–5.0)
HCT: 39.3 % (ref 36.0–46.0)
Hemoglobin: 13.2 g/dL (ref 12.0–15.0)
Lymphocytes Relative: 33.6 % (ref 12.0–46.0)
Lymphs Abs: 1.9 10*3/uL (ref 0.7–4.0)
MCHC: 33.6 g/dL (ref 30.0–36.0)
MCV: 95.2 fl (ref 78.0–100.0)
Monocytes Absolute: 0.5 10*3/uL (ref 0.1–1.0)
Monocytes Relative: 8.7 % (ref 3.0–12.0)
Neutro Abs: 3.1 10*3/uL (ref 1.4–7.7)
Neutrophils Relative %: 54.5 % (ref 43.0–77.0)
Platelets: 289 10*3/uL (ref 150.0–400.0)
RBC: 4.12 Mil/uL (ref 3.87–5.11)
RDW: 13.9 % (ref 11.5–15.5)
WBC: 5.8 10*3/uL (ref 4.0–10.5)

## 2022-04-15 LAB — LIPID PANEL
Cholesterol: 230 mg/dL — ABNORMAL HIGH (ref 0–200)
HDL: 63.3 mg/dL (ref >39.00)
LDL Cholesterol: 150 mg/dL — ABNORMAL HIGH (ref 0–99)
NonHDL: 166.23
Total CHOL/HDL Ratio: 4
Triglycerides: 83 mg/dL (ref 0.0–149.0)
VLDL: 16.6 mg/dL (ref 0.0–40.0)

## 2022-04-15 MED ORDER — DILTIAZEM HCL ER COATED BEADS 300 MG PO CP24: ORAL_CAPSULE | ORAL | 3 refills | Status: DC

## 2022-04-15 MED ORDER — HYDROCHLOROTHIAZIDE 25 MG PO TABS: ORAL_TABLET | ORAL | 3 refills | Status: DC

## 2022-04-15 MED ORDER — LISINOPRIL 20 MG PO TABS: 20.0000 mg | ORAL_TABLET | Freq: Every day | ORAL | 3 refills | Status: DC

## 2022-04-15 NOTE — Patient Instructions (Addendum)
[facility] contact Please call to schedule visit and/or procedure Address: [address] [place], [place], [place] [address] Phone: [phone]   Please schedule a mammogram!  Please go to the lab for blood work.   Our office will call you with your results unless you have chosen to receive results via MyChart.  If your blood work is normal we will follow-up each year for physicals and as scheduled for chronic medical problems.  If anything is abnormal we will treat accordingly and get you in for a follow-up.  Take care,  Lelon Mast

## 2022-04-15 NOTE — Progress Notes (Signed)
Subjective:    Laura Clay is a 51 y.o. female and is here for a comprehensive physical exam.  HPI  Health Maintenance Due  Topic Date Due   COVID-19 Vaccine (1) Never done   Hepatitis C Screening  Never done   COLONOSCOPY (Pts 45-29yrs Insurance coverage will need to be confirmed)  Never done   Acute Concerns:   Chronic Issues: HTN Currently taking diltiazem 300 mg daily, HCTZ 25 mg daily, lisinopril 20 mg daily. At home blood pressure readings are: not regularly checked. Patient denies chest pain, SOB, blurred vision, dizziness, unusual headaches, lower leg swelling. Patient is compliant with medication. Denies excessive caffeine intake, stimulant usage, excessive alcohol intake, or increase in salt consumption.  BP Readings from Last 3 Encounters:  04/15/22 122/80  03/26/21 111/78  11/27/19 132/84   Asthma Currently taking advair 112-12 BID, albuterol prn. Denies concerns.  Health Maintenance: Immunizations -- UTD Colonoscopy -- overdue Mammogram -- overdue PAP -- n/a Diet -- working on healthy diet Sleep habits -- no major concerns Exercise -- walking whenever she can Weight -- Weight: 168 lb 9.6 oz (76.5 kg)  Mood -- stable Weight history: Wt Readings from Last 10 Encounters:  04/15/22 168 lb 9.6 oz (76.5 kg)  03/26/21 162 lb 9.6 oz (73.8 kg)  11/27/19 157 lb 12.8 oz (71.6 kg)  01/15/19 149 lb (67.6 kg)  06/19/18 153 lb 12.8 oz (69.8 kg)  06/08/18 159 lb 3.2 oz (72.2 kg)  03/16/18 161 lb (73 kg)  06/14/17 161 lb 12.8 oz (73.4 kg)  05/08/17 161 lb (73 kg)  04/11/17 159 lb 8 oz (72.3 kg)   Body mass index is 28.27 kg/m. No LMP recorded (lmp unknown). Patient has had a hysterectomy. Alcohol use:  reports current alcohol use. Tobacco use:  Tobacco Use: Medium Risk (04/15/2022)   Patient History    Smoking Tobacco Use: Former    Smokeless Tobacco Use: Never    Passive Exposure: Not on file        04/15/2022    8:47 AM  Depression screen PHQ 2/9   Decreased Interest 0  Down, Depressed, Hopeless 0  PHQ - 2 Score 0  Altered sleeping 0  Tired, decreased energy 0  Change in appetite 0  Feeling bad or failure about yourself  0  Trouble concentrating 0  Moving slowly or fidgety/restless 0  Suicidal thoughts 0  PHQ-9 Score 0  Difficult doing work/chores Not difficult at all     Other providers/specialists: Patient Care Team: Jarold Motto, [place] as PCP - General (Physician Assistant) Marykay Lex, MD as PCP - Cardiology (Cardiology)    PMHx, SurgHx, SocialHx, Medications, and Allergies were reviewed in the Visit Navigator and updated as appropriate.   Past Medical History:  Diagnosis Date   Allergy    Anxiety    Asthma    Depression    GERD (gastroesophageal reflux disease)    Hypertension    Kidney stones      Past Surgical History:  Procedure Laterality Date   ABDOMINAL HYSTERECTOMY  08/08/1998   Abnormal PAPs   BUNIONECTOMY Left    OVARIAN CYST REMOVAL       Family History  Problem Relation Age of Onset   Breast cancer Maternal Grandmother    Colon cancer Maternal Aunt    Colon polyps Cousin    Colon polyps Maternal Aunt    Diabetes Father    Kidney disease Father    Congestive Heart Failure Father 40  Vision loss Father    Kidney cancer Maternal Uncle    Hypertension Mother    Valvular heart disease Mother        ? Mitral Regurg --> s/p Operative repair.   Varicose Veins Mother    Asthma Daughter     Social History   Tobacco Use   Smoking status: Former    Years: 12.00    Types: Cigarettes   Smokeless tobacco: Never  Substance Use Topics   Alcohol use: Yes    Comment: Socially   Drug use: No    Review of Systems:   ROS  Objective:   BP 122/80   Pulse 95   Temp 98.1 F (36.7 C)   Ht 5' 4.75" (1.645 m)   Wt 168 lb 9.6 oz (76.5 kg)   LMP  (LMP Unknown)   SpO2 99%   BMI 28.27 kg/m  Body mass index is 28.27 kg/m.   General Appearance:    Alert, cooperative, no  distress, appears stated age  Head:    Normocephalic, without obvious abnormality, atraumatic  Eyes:    PERRL, conjunctiva/corneas clear, EOM's intact, fundi    benign, both eyes  Ears:    Normal TM's and external ear canals, both ears  Nose:   Nares normal, septum midline, mucosa normal, no drainage    or sinus tenderness  Throat:   Lips, mucosa, and tongue normal; teeth and gums normal  Neck:   Supple, symmetrical, trachea midline, no adenopathy;    thyroid:  no enlargement/tenderness/nodules; no carotid   bruit or JVD  Back:     Symmetric, no curvature, ROM normal, no CVA tenderness  Lungs:     Clear to auscultation bilaterally, respirations unlabored  Chest Wall:    No tenderness or deformity   Heart:    Regular rate and rhythm, S1 and S2 normal, no murmur, rub or gallop  Breast Exam:    Deferred  Abdomen:     Soft, non-tender, bowel sounds active all four quadrants,    no masses, no organomegaly  Genitalia:    Deferred  Extremities:   Extremities normal, atraumatic, no cyanosis or edema  Pulses:   2+ and symmetric all extremities  Skin:   Skin color, texture, turgor normal, no rashes or lesions  Lymph nodes:   Cervical, supraclavicular, and axillary nodes normal  Neurologic:   CNII-XII intact, normal strength, sensation and reflexes    throughout    Assessment/Plan:   Routine physical examination Today patient counseled on age appropriate routine health concerns for screening and prevention, each reviewed and up to date or declined. Immunizations reviewed and up to date or declined. Labs ordered and reviewed. Risk factors for depression reviewed and negative. Hearing function and visual acuity are intact. ADLs screened and addressed as needed. Functional ability and level of safety reviewed and appropriate. Education, counseling and referrals performed based on assessed risks today. Patient provided with a copy of personalized plan for preventive services.  Special screening for  malignant neoplasms, colon Order placed  Essential hypertension Normotensive Continue diltiazem 300 mg daily, HCTZ 25 mg daily, lisinopril 20 mg daily Follow-up in 1 year, sooner if concerns  Encounter for lipid screening for cardiovascular disease Update lipid screening and make recommendations accordingly  Moderate persistent asthma with acute exacerbation Well controlled  Continue advair BID and albuterol prn   Patient Counseling: [x]    Nutrition: Stressed importance of moderation in sodium/caffeine intake, saturated fat and cholesterol, caloric balance, sufficient intake of fresh fruits,  vegetables, fiber, calcium, iron, and 1 mg of folate supplement per day (for females capable of pregnancy).  [x]    Stressed the importance of regular exercise.   [x]    Substance Abuse: Discussed cessation/primary prevention of tobacco, alcohol, or other drug use; driving or other dangerous activities under the influence; availability of treatment for abuse.   [x]    Injury prevention: Discussed safety belts, safety helmets, smoke detector, smoking near bedding or upholstery.   [x]    Sexuality: Discussed sexually transmitted diseases, partner selection, use of condoms, avoidance of unintended pregnancy  and contraceptive alternatives.  [x]    Dental health: Discussed importance of regular tooth brushing, flossing, and dental visits.  [x]    Health maintenance and immunizations reviewed. Please refer to Health maintenance section.    , PA-C  [facility]

## 2022-04-18 ENCOUNTER — Other Ambulatory Visit: Payer: Self-pay | Admitting: Physician Assistant

## 2022-04-18 MED ORDER — ATORVASTATIN CALCIUM 20 MG PO TABS: 20.0000 mg | ORAL_TABLET | Freq: Every day | ORAL | 3 refills | Status: DC

## 2022-04-19 ENCOUNTER — Other Ambulatory Visit: Payer: Self-pay | Admitting: Physician Assistant

## 2022-06-20 ENCOUNTER — Other Ambulatory Visit: Payer: Self-pay | Admitting: Physician Assistant

## 2022-08-03 ENCOUNTER — Other Ambulatory Visit: Payer: Self-pay | Admitting: Physician Assistant

## 2022-09-14 ENCOUNTER — Other Ambulatory Visit: Payer: Self-pay | Admitting: Physician Assistant

## 2022-10-04 ENCOUNTER — Other Ambulatory Visit: Payer: Self-pay | Admitting: Physician Assistant

## 2022-11-23 ENCOUNTER — Other Ambulatory Visit: Payer: Self-pay | Admitting: Physician Assistant

## 2022-12-18 ENCOUNTER — Other Ambulatory Visit: Payer: Self-pay | Admitting: Physician Assistant

## 2023-03-15 ENCOUNTER — Other Ambulatory Visit: Payer: Self-pay | Admitting: Physician Assistant

## 2023-04-17 ENCOUNTER — Other Ambulatory Visit: Payer: Self-pay | Admitting: Physician Assistant

## 2023-04-18 ENCOUNTER — Telehealth: Payer: Self-pay | Admitting: Physician Assistant

## 2023-04-18 MED ORDER — LISINOPRIL 20 MG PO TABS: 20.0000 mg | ORAL_TABLET | Freq: Every day | ORAL | 0 refills | Status: DC

## 2023-04-18 MED ORDER — ATORVASTATIN CALCIUM 20 MG PO TABS: 20.0000 mg | ORAL_TABLET | Freq: Every day | ORAL | 0 refills | Status: DC

## 2023-04-18 MED ORDER — HYDROCHLOROTHIAZIDE 25 MG PO TABS: ORAL_TABLET | ORAL | 0 refills | Status: DC

## 2023-04-18 NOTE — Telephone Encounter (Signed)
Prescription Request  04/18/2023  LOV: Visit date not found  What is the name of the medication or equipment?  atorvastatin (LIPITOR) 20 MG tablet   hydrochlorothiazide (HYDRODIURIL) 25 MG tablet   lisinopril (ZESTRIL) 20 MG tablet    Have you contacted your pharmacy to request a refill? Yes   Which pharmacy would you like this sent to?  [facility] [address] [place], [place] - [address] [place] [address] Phone: [phone] Fax: [phone]    Patient notified that their request is being sent to the clinical staff for review and that they should receive a response within 2 business days.   Please advise at Mobile [phone] (mobile)

## 2023-04-18 NOTE — Telephone Encounter (Signed)
Spoke to pt told her can only send in 30 day supply due to overdue for physical. Pt said she scheduled physical for October. Told her okay will send in 90 supply of each medication. Pt verbalized understanding. Rx's sent

## 2023-04-26 ENCOUNTER — Other Ambulatory Visit: Payer: Self-pay | Admitting: Physician Assistant

## 2023-05-08 ENCOUNTER — Other Ambulatory Visit: Payer: Self-pay | Admitting: Physician Assistant

## 2023-05-31 NOTE — Progress Notes (Signed)
Subjective:    Laura Clay is a 52 y.o. female and is here for a comprehensive physical exam.  HPI  There are no preventive care reminders to display for this patient.  Acute Concerns: None  Chronic Issues: Allergies and Asthma  Treated with albuterol inhaler, albuterol nebulizer, fluticasone-salmeterol inhaler. Denies recent flare-ups.  Hypertension Treated with lisinopril 20 mg daily, diltiazem 300 mg daily, hydrochlorothiazide 25 mg daily. Blood pressure normal today at 130/80.  Hyperlipidemia  Treated with atorvastatin 20 mg daily. She would like to taper off of this medication. Denies negative side effects.  Does not see dermatology- no new or concerning skin problems. Denies hearing loss, headaches, memory loss, cognitive issues.  Health Maintenance: Immunizations -- Declines vaccines. Colonoscopy -- Had endoscopy and colonoscopy in 2003. She was found to have reflux esophagitis and gastritis on EGD and colonoscopy was negative with the exception of hemorrhoids. Does not wish to have repeat colonoscopy at this time. Defers/declines recommendations today, and has full decision making capacity. Mammogram -- Due for screening. Defers/declines recommendations today, and has full decision making capacity. PAP -- Status post abdominal hysterectomy 2000. Bone Density -- N/A Diet -- Healthy Exercise -- Maintains active lifestyle.  Sleep habits -- Stable Mood -- Stable  UTD with dentist? - Yes UTD with eye doctor? - Yes  Weight history: Wt Readings from Last 10 Encounters:  06/07/23 160 lb (72.6 kg)  04/15/22 168 lb 9.6 oz (76.5 kg)  03/26/21 162 lb 9.6 oz (73.8 kg)  11/27/19 157 lb 12.8 oz (71.6 kg)  01/15/19 149 lb (67.6 kg)  06/19/18 153 lb 12.8 oz (69.8 kg)  06/08/18 159 lb 3.2 oz (72.2 kg)  03/16/18 161 lb (73 kg)  06/14/17 161 lb 12.8 oz (73.4 kg)  05/08/17 161 lb (73 kg)   Body mass index is 26.83 kg/m. No LMP recorded (lmp unknown). Patient has  had a hysterectomy.  Alcohol use:  reports current alcohol use.  Tobacco use:  Tobacco Use: Medium Risk (06/07/2023)   Patient History    Smoking Tobacco Use: Former    Smokeless Tobacco Use: Never    Passive Exposure: Not on file   Eligible for lung cancer screening? no     06/07/2023    1:27 PM  Depression screen PHQ 2/9  Decreased Interest 0  Down, Depressed, Hopeless 0  PHQ - 2 Score 0     Other providers/specialists: Patient Care Team: Jarold Motto, [place] as PCP - General (Physician Assistant) Marykay Lex, MD as PCP - Cardiology (Cardiology)    PMHx, SurgHx, SocialHx, Medications, and Allergies were reviewed in the Visit Navigator and updated as appropriate.   Past Medical History:  Diagnosis Date   Allergy    Anxiety    Asthma    Depression    GERD (gastroesophageal reflux disease)    Hypertension    Kidney stones      Past Surgical History:  Procedure Laterality Date   ABDOMINAL HYSTERECTOMY  08/08/1998   Abnormal PAPs   BUNIONECTOMY Left    OVARIAN CYST REMOVAL       Family History  Problem Relation Age of Onset   Breast cancer Maternal Grandmother    Colon cancer Maternal Aunt    Colon polyps Cousin    Colon polyps Maternal Aunt    Diabetes Father    Kidney disease Father    Congestive Heart Failure Father 55   Vision loss Father    Kidney cancer Maternal Uncle    Hypertension  Mother    Valvular heart disease Mother        ? Mitral Regurg --> s/p Operative repair.   Varicose Veins Mother    Asthma Daughter     Social History   Tobacco Use   Smoking status: Former    Types: Cigarettes   Smokeless tobacco: Never  Substance Use Topics   Alcohol use: Yes    Comment: Socially   Drug use: No    Review of Systems:   Review of Systems  Constitutional:  Negative for chills, fever, malaise/fatigue and weight loss.  HENT:  Negative for hearing loss, sinus pain and sore throat.   Respiratory:  Negative for cough, hemoptysis and  shortness of breath.   Cardiovascular:  Negative for chest pain, palpitations, leg swelling and PND.  Gastrointestinal:  Negative for abdominal pain, constipation, diarrhea, heartburn, nausea and vomiting.  Genitourinary:  Negative for dysuria, frequency and urgency.  Musculoskeletal:  Negative for back pain, myalgias and neck pain.  Skin:  Negative for itching and rash.  Neurological:  Negative for dizziness, tingling, seizures and headaches.  Endo/Heme/Allergies:  Negative for polydipsia.  Psychiatric/Behavioral:  Negative for depression. The patient is not nervous/anxious.     Objective:   BP 130/80 (BP Location: Left Arm, Patient Position: Sitting, Cuff Size: Normal)   Pulse 94   Temp (!) 97.1 F (36.2 C) (Temporal)   Ht 5' 4.75" (1.645 m)   Wt 160 lb (72.6 kg)   LMP  (LMP Unknown)   SpO2 98%   BMI 26.83 kg/m  Body mass index is 26.83 kg/m.   General Appearance:    Alert, cooperative, no distress, appears stated age  Head:    Normocephalic, without obvious abnormality, atraumatic  Eyes:    PERRL, conjunctiva/corneas clear, EOM's intact, fundi    benign, both eyes  Ears:    Normal TM's and external ear canals, both ears  Nose:   Nares normal, septum midline, mucosa normal, no drainage    or sinus tenderness  Throat:   Lips, mucosa, and tongue normal; teeth and gums normal  Neck:   Supple, symmetrical, trachea midline, no adenopathy;    thyroid:  no enlargement/tenderness/nodules; no carotid   bruit or JVD  Back:     Symmetric, no curvature, ROM normal, no CVA tenderness  Lungs:     Clear to auscultation bilaterally, respirations unlabored  Chest Wall:    No tenderness or deformity   Heart:    Regular rate and rhythm, S1 and S2 normal, no murmur, rub or gallop  Breast Exam:    Deferred  Abdomen:     Soft, non-tender, bowel sounds active all four quadrants,    no masses, no organomegaly  Genitalia:    Deferred  Extremities:   Extremities normal, atraumatic, no cyanosis  or edema  Pulses:   2+ and symmetric all extremities  Skin:   Skin color, texture, turgor normal, no rashes or lesions  Lymph nodes:   Cervical, supraclavicular, and axillary nodes normal  Neurologic:   CNII-XII intact, normal strength, sensation and reflexes    throughout    Assessment/Plan:   Routine physical examination Today patient counseled on age appropriate routine health concerns for screening and prevention, each reviewed and up to date or declined. Immunizations reviewed and up to date or declined. Labs ordered and reviewed. Risk factors for depression reviewed and negative. Hearing function and visual acuity are intact. ADLs screened and addressed as needed. Functional ability and level of safety reviewed and  appropriate. Education, counseling and referrals performed based on assessed risks today. Patient provided with a copy of personalized plan for preventive services.  Essential hypertension Normotensive Continue lisinopril 20 mg daily, diltiazem 300 mg daily, hydrochlorothiazide 25 mg daily. Follow-up 1 year(s), sooner if concerns or abnormal readings  Hyperlipidemia, unspecified hyperlipidemia type Update lipid panel She would like to stop her statin - doesn't want to be on this anymore She prefers to wean off this medication Decrease Lipitor to 10 mg daily  Moderate persistent asthma with acute exacerbation Overall controlled Continue advair scheduled and as needed albuterol Follow-up yearly -- sooner if concerns  Medical non-compliance Defers/declines recommendations today, and has full decision making capacity all cancer screenings including: colonoscopy, mammograms, skin cancer screenings  Overweight Continue efforts at healthy lifestyle  I,Alexander Ruley,acting as a scribe for Jarold Motto, PA.,have documented all relevant documentation on the behalf of Jarold Motto, PA,as directed by  Jarold Motto, PA while in the presence of Jarold Motto,  [place].  I, Jarold Motto, [place], have reviewed all documentation for this visit. The documentation on 06/07/23 for the exam, diagnosis, procedures, and orders are all accurate and complete.  Jarold Motto, PA-C [facility] [place]

## 2023-06-07 ENCOUNTER — Encounter: Payer: Self-pay | Admitting: Physician Assistant

## 2023-06-07 ENCOUNTER — Ambulatory Visit (INDEPENDENT_AMBULATORY_CARE_PROVIDER_SITE_OTHER): Payer: 59 | Admitting: Physician Assistant

## 2023-06-07 VITALS — BP 130/80 | HR 94 | Temp 97.1°F | Ht 64.75 in | Wt 160.0 lb

## 2023-06-07 DIAGNOSIS — E785 Hyperlipidemia, unspecified: Secondary | ICD-10-CM

## 2023-06-07 DIAGNOSIS — I10 Essential (primary) hypertension: Secondary | ICD-10-CM

## 2023-06-07 DIAGNOSIS — Z91199 Patient's noncompliance with other medical treatment and regimen due to unspecified reason: Secondary | ICD-10-CM

## 2023-06-07 DIAGNOSIS — E663 Overweight: Secondary | ICD-10-CM

## 2023-06-07 DIAGNOSIS — J4541 Moderate persistent asthma with (acute) exacerbation: Secondary | ICD-10-CM | POA: Diagnosis not present

## 2023-06-07 DIAGNOSIS — Z0000 Encounter for general adult medical examination without abnormal findings: Secondary | ICD-10-CM | POA: Diagnosis not present

## 2023-06-07 MED ORDER — FLUTICASONE-SALMETEROL 113-14 MCG/ACT IN AEPB: INHALATION_SPRAY | RESPIRATORY_TRACT | 5 refills | Status: DC

## 2023-06-07 MED ORDER — ALBUTEROL SULFATE HFA 108 (90 BASE) MCG/ACT IN AERS: 2.0000 | INHALATION_SPRAY | Freq: Four times a day (QID) | RESPIRATORY_TRACT | 3 refills | Status: AC | PRN

## 2023-06-07 MED ORDER — LISINOPRIL 20 MG PO TABS: 20.0000 mg | ORAL_TABLET | Freq: Every day | ORAL | 3 refills | Status: DC

## 2023-06-07 MED ORDER — ATORVASTATIN CALCIUM 10 MG PO TABS: 10.0000 mg | ORAL_TABLET | Freq: Every day | ORAL | 0 refills | Status: AC

## 2023-06-07 MED ORDER — HYDROCHLOROTHIAZIDE 25 MG PO TABS: ORAL_TABLET | ORAL | 3 refills | Status: DC

## 2023-06-07 MED ORDER — ALBUTEROL SULFATE (2.5 MG/3ML) 0.083% IN NEBU: INHALATION_SOLUTION | RESPIRATORY_TRACT | 1 refills | Status: AC

## 2023-06-07 MED ORDER — VALACYCLOVIR HCL 1 G PO TABS: 1000.0000 mg | ORAL_TABLET | Freq: Every day | ORAL | 3 refills | Status: DC

## 2023-06-07 MED ORDER — DILTIAZEM HCL ER COATED BEADS 300 MG PO CP24: ORAL_CAPSULE | ORAL | 3 refills | Status: DC

## 2023-06-07 NOTE — Patient Instructions (Signed)
It was great to see you!  Decrease Lipitor to 10 mg daily x 4 weeks and then stop  Please go to the lab for blood work.   Our office will call you with your results unless you have chosen to receive results via MyChart.  If your blood work is normal we will follow-up each year for physicals and as scheduled for chronic medical problems.  If anything is abnormal we will treat accordingly and get you in for a follow-up.  Take care,  Laura Clay

## 2024-06-07 ENCOUNTER — Encounter: Payer: 59 | Admitting: Physician Assistant

## 2024-06-20 ENCOUNTER — Other Ambulatory Visit: Payer: Self-pay | Admitting: Physician Assistant

## 2024-07-11 ENCOUNTER — Other Ambulatory Visit: Payer: Self-pay | Admitting: Physician Assistant

## 2024-07-21 ENCOUNTER — Other Ambulatory Visit: Payer: Self-pay | Admitting: Physician Assistant

## 2024-07-22 ENCOUNTER — Other Ambulatory Visit: Payer: Self-pay | Admitting: Physician Assistant

## 2024-07-23 ENCOUNTER — Telehealth: Payer: Self-pay | Admitting: Physician Assistant

## 2024-07-23 NOTE — Telephone Encounter (Signed)
 Called pt and was able to schedule but due to need for morning CPE, pt scheduled for 09/30/24. I explained to pt about medication refills being handled until then and she verbalized understanding. Appt for 07/25/24 has been canceled.

## 2024-07-23 NOTE — Telephone Encounter (Signed)
 Noted, refills were sent to pharmacy enough till appt

## 2024-07-23 NOTE — Telephone Encounter (Signed)
 Please schedule pt for CPE in Jan. I will refill medications till appt. Please let me know what medications she needs.

## 2024-07-23 NOTE — Telephone Encounter (Signed)
 Patient scheduled via mychart for 07/25/24 to get prescriptions refilled. Patient due for CPE but next CPE opening isn't available until January. If I'm able to reschedule patient to January for CPE, can prescriptions be filled until then? Please advise.

## 2024-07-25 ENCOUNTER — Ambulatory Visit: Admitting: Physician Assistant

## 2024-07-30 ENCOUNTER — Other Ambulatory Visit: Payer: Self-pay | Admitting: Physician Assistant

## 2024-08-21 ENCOUNTER — Other Ambulatory Visit: Payer: Self-pay | Admitting: Physician Assistant

## 2024-09-30 ENCOUNTER — Encounter: Admitting: Physician Assistant
